# Patient Record
Sex: Male | Born: 1955
Health system: Southern US, Community
[De-identification: ages and names within clinical notes are randomized; demographics above are authoritative.]

## PROBLEM LIST (undated history)

## (undated) DIAGNOSIS — E782 Mixed hyperlipidemia: Secondary | ICD-10-CM

## (undated) DIAGNOSIS — R7302 Impaired glucose tolerance (oral): Secondary | ICD-10-CM

## (undated) DIAGNOSIS — R05 Cough: Secondary | ICD-10-CM

## (undated) DIAGNOSIS — E8881 Metabolic syndrome: Secondary | ICD-10-CM

## (undated) DIAGNOSIS — D689 Coagulation defect, unspecified: Secondary | ICD-10-CM

## (undated) DIAGNOSIS — Z872 Personal history of diseases of the skin and subcutaneous tissue: Secondary | ICD-10-CM

## (undated) DIAGNOSIS — F411 Generalized anxiety disorder: Secondary | ICD-10-CM

## (undated) DIAGNOSIS — E119 Type 2 diabetes mellitus without complications: Secondary | ICD-10-CM

## (undated) DIAGNOSIS — K219 Gastro-esophageal reflux disease without esophagitis: Secondary | ICD-10-CM

## (undated) DIAGNOSIS — R011 Cardiac murmur, unspecified: Secondary | ICD-10-CM

## (undated) DIAGNOSIS — M5412 Radiculopathy, cervical region: Secondary | ICD-10-CM

## (undated) DIAGNOSIS — T7840XA Allergy, unspecified, initial encounter: Secondary | ICD-10-CM

## (undated) DIAGNOSIS — N400 Enlarged prostate without lower urinary tract symptoms: Secondary | ICD-10-CM

## (undated) DIAGNOSIS — Z87891 Personal history of nicotine dependence: Secondary | ICD-10-CM

## (undated) DIAGNOSIS — E739 Lactose intolerance, unspecified: Secondary | ICD-10-CM

## (undated) DIAGNOSIS — I2510 Atherosclerotic heart disease of native coronary artery without angina pectoris: Secondary | ICD-10-CM

## (undated) DIAGNOSIS — J069 Acute upper respiratory infection, unspecified: Secondary | ICD-10-CM

## (undated) DIAGNOSIS — G5600 Carpal tunnel syndrome, unspecified upper limb: Secondary | ICD-10-CM

## (undated) DIAGNOSIS — E785 Hyperlipidemia, unspecified: Secondary | ICD-10-CM

## (undated) DIAGNOSIS — K625 Hemorrhage of anus and rectum: Secondary | ICD-10-CM

## (undated) DIAGNOSIS — T887XXA Unspecified adverse effect of drug or medicament, initial encounter: Secondary | ICD-10-CM

## (undated) DIAGNOSIS — I10 Essential (primary) hypertension: Secondary | ICD-10-CM

## (undated) HISTORY — DX: Impaired glucose tolerance (oral): R73.02

## (undated) HISTORY — DX: Acute upper respiratory infection, unspecified: J06.9

## (undated) HISTORY — DX: Lactose intolerance, unspecified: E73.9

## (undated) HISTORY — PX: SPINE SURGERY: SHX786

## (undated) HISTORY — DX: Benign prostatic hyperplasia without lower urinary tract symptoms: N40.0

## (undated) HISTORY — DX: Cough: R05

## (undated) HISTORY — DX: Mixed hyperlipidemia: E78.2

## (undated) HISTORY — DX: Carpal tunnel syndrome, unspecified upper limb: G56.00

## (undated) HISTORY — DX: Personal history of nicotine dependence: Z87.891

## (undated) HISTORY — DX: Hyperlipidemia, unspecified: E78.5

## (undated) HISTORY — DX: Metabolic syndrome: E88.81

## (undated) HISTORY — PX: POLYPECTOMY: SHX149

## (undated) HISTORY — DX: Personal history of diseases of the skin and subcutaneous tissue: Z87.2

## (undated) HISTORY — DX: Unspecified adverse effect of drug or medicament, initial encounter: T88.7XXA

## (undated) HISTORY — DX: Gastro-esophageal reflux disease without esophagitis: K21.9

## (undated) HISTORY — DX: Essential (primary) hypertension: I10

## (undated) HISTORY — DX: Generalized anxiety disorder: F41.1

## (undated) HISTORY — DX: Hemorrhage of anus and rectum: K62.5

## (undated) HISTORY — DX: Allergy, unspecified, initial encounter: T78.40XA

## (undated) HISTORY — DX: Coagulation defect, unspecified: D68.9

## (undated) HISTORY — PX: FINGER SURGERY: SHX640

---

## 2004-07-09 ENCOUNTER — Ambulatory Visit: Payer: Self-pay | Admitting: Cardiology

## 2004-08-03 ENCOUNTER — Ambulatory Visit: Payer: Self-pay | Admitting: Internal Medicine

## 2005-05-14 ENCOUNTER — Ambulatory Visit: Payer: Self-pay | Admitting: Internal Medicine

## 2006-05-15 ENCOUNTER — Ambulatory Visit: Payer: Self-pay | Admitting: Internal Medicine

## 2006-05-16 ENCOUNTER — Encounter (INDEPENDENT_AMBULATORY_CARE_PROVIDER_SITE_OTHER): Payer: Self-pay | Admitting: *Deleted

## 2006-06-30 ENCOUNTER — Emergency Department (HOSPITAL_COMMUNITY): Admission: EM | Admit: 2006-06-30 | Discharge: 2006-06-30 | Payer: Self-pay | Admitting: Emergency Medicine

## 2006-07-01 ENCOUNTER — Ambulatory Visit: Payer: Self-pay | Admitting: Internal Medicine

## 2006-07-10 ENCOUNTER — Encounter: Admission: RE | Admit: 2006-07-10 | Discharge: 2006-07-10 | Payer: Self-pay | Admitting: Internal Medicine

## 2006-11-24 ENCOUNTER — Ambulatory Visit: Payer: Self-pay | Admitting: Internal Medicine

## 2006-11-24 LAB — CONVERTED CEMR LAB
ALT: 15 units/L (ref 0–40)
AST: 21 units/L (ref 0–37)
Direct LDL: 187.8 mg/dL
Triglycerides: 162 mg/dL — ABNORMAL HIGH (ref 0–149)

## 2006-12-19 ENCOUNTER — Ambulatory Visit: Payer: Self-pay | Admitting: Internal Medicine

## 2007-04-13 ENCOUNTER — Telehealth (INDEPENDENT_AMBULATORY_CARE_PROVIDER_SITE_OTHER): Payer: Self-pay | Admitting: *Deleted

## 2007-04-23 ENCOUNTER — Ambulatory Visit: Payer: Self-pay | Admitting: Internal Medicine

## 2007-05-01 ENCOUNTER — Encounter (INDEPENDENT_AMBULATORY_CARE_PROVIDER_SITE_OTHER): Payer: Self-pay | Admitting: *Deleted

## 2007-05-01 ENCOUNTER — Ambulatory Visit: Payer: Self-pay | Admitting: Internal Medicine

## 2007-05-01 DIAGNOSIS — I10 Essential (primary) hypertension: Secondary | ICD-10-CM | POA: Insufficient documentation

## 2007-05-01 DIAGNOSIS — E785 Hyperlipidemia, unspecified: Secondary | ICD-10-CM | POA: Insufficient documentation

## 2007-05-01 HISTORY — DX: Hyperlipidemia, unspecified: E78.5

## 2007-05-01 HISTORY — DX: Essential (primary) hypertension: I10

## 2007-05-01 LAB — CONVERTED CEMR LAB
ALT: 24 units/L (ref 0–53)
AST: 21 units/L (ref 0–37)
Cholesterol, target level: 200 mg/dL
Cholesterol: 249 mg/dL (ref 0–200)
Direct LDL: 96.5 mg/dL
HDL: 41 mg/dL (ref >39.0)
Hgb A1c MFr Bld: 5.6 % (ref 4.6–6.0)

## 2007-05-26 ENCOUNTER — Ambulatory Visit: Payer: Self-pay | Admitting: Internal Medicine

## 2007-05-29 ENCOUNTER — Encounter (INDEPENDENT_AMBULATORY_CARE_PROVIDER_SITE_OTHER): Payer: Self-pay | Admitting: *Deleted

## 2007-06-08 ENCOUNTER — Encounter: Payer: Self-pay | Admitting: Internal Medicine

## 2007-07-24 ENCOUNTER — Telehealth (INDEPENDENT_AMBULATORY_CARE_PROVIDER_SITE_OTHER): Payer: Self-pay | Admitting: *Deleted

## 2007-10-02 ENCOUNTER — Ambulatory Visit: Payer: Self-pay | Admitting: Internal Medicine

## 2007-10-03 LAB — CONVERTED CEMR LAB
Basophils Relative: 1.2 % — ABNORMAL HIGH (ref 0.0–1.0)
CO2: 28 meq/L (ref 19–32)
Calcium: 9.2 mg/dL (ref 8.4–10.5)
Creatinine, Ser: 1 mg/dL (ref 0.4–1.5)
Direct LDL: 76.9 mg/dL
Eosinophils Relative: 2.7 % (ref 0.0–5.0)
GFR calc Af Amer: 102 mL/min
Glucose, Bld: 120 mg/dL — ABNORMAL HIGH (ref 70–99)
Hemoglobin: 14.5 g/dL (ref 13.0–17.0)
Lymphocytes Relative: 28.1 % (ref 12.0–46.0)
Microalb Creat Ratio: 5.3 mg/g (ref 0.0–30.0)
Microalb, Ur: 1.1 mg/dL (ref 0.0–1.9)
Monocytes Absolute: 0.4 10*3/uL (ref 0.2–0.7)
Neutro Abs: 3.2 10*3/uL (ref 1.4–7.7)
PSA: 0.89 ng/mL (ref 0.10–4.00)
Potassium: 4.1 meq/L (ref 3.5–5.1)
RDW: 11.9 % (ref 11.5–14.6)
TSH: 1.71 microintl units/mL (ref 0.35–5.50)
Total CHOL/HDL Ratio: 7.5
VLDL: 161 mg/dL — ABNORMAL HIGH (ref 0–40)
WBC: 5.3 10*3/uL (ref 4.5–10.5)

## 2007-10-06 ENCOUNTER — Encounter (INDEPENDENT_AMBULATORY_CARE_PROVIDER_SITE_OTHER): Payer: Self-pay | Admitting: *Deleted

## 2007-10-08 ENCOUNTER — Ambulatory Visit: Payer: Self-pay | Admitting: Internal Medicine

## 2007-10-08 ENCOUNTER — Encounter (INDEPENDENT_AMBULATORY_CARE_PROVIDER_SITE_OTHER): Payer: Self-pay | Admitting: *Deleted

## 2007-10-08 ENCOUNTER — Encounter: Payer: Self-pay | Admitting: Internal Medicine

## 2007-10-08 DIAGNOSIS — E8881 Metabolic syndrome: Secondary | ICD-10-CM

## 2007-10-08 DIAGNOSIS — N400 Enlarged prostate without lower urinary tract symptoms: Secondary | ICD-10-CM

## 2007-10-08 DIAGNOSIS — Z872 Personal history of diseases of the skin and subcutaneous tissue: Secondary | ICD-10-CM

## 2007-10-08 DIAGNOSIS — E782 Mixed hyperlipidemia: Secondary | ICD-10-CM | POA: Insufficient documentation

## 2007-10-08 DIAGNOSIS — F411 Generalized anxiety disorder: Secondary | ICD-10-CM | POA: Insufficient documentation

## 2007-10-08 DIAGNOSIS — Z87891 Personal history of nicotine dependence: Secondary | ICD-10-CM

## 2007-10-08 HISTORY — DX: Metabolic syndrome: E88.81

## 2007-10-08 HISTORY — DX: Personal history of nicotine dependence: Z87.891

## 2007-10-08 HISTORY — DX: Personal history of diseases of the skin and subcutaneous tissue: Z87.2

## 2007-10-08 HISTORY — DX: Benign prostatic hyperplasia without lower urinary tract symptoms: N40.0

## 2007-10-08 HISTORY — DX: Metabolic syndrome: E88.810

## 2007-10-08 HISTORY — DX: Generalized anxiety disorder: F41.1

## 2007-10-08 HISTORY — DX: Mixed hyperlipidemia: E78.2

## 2007-10-12 ENCOUNTER — Encounter (INDEPENDENT_AMBULATORY_CARE_PROVIDER_SITE_OTHER): Payer: Self-pay | Admitting: *Deleted

## 2007-10-16 ENCOUNTER — Telehealth: Payer: Self-pay | Admitting: Internal Medicine

## 2007-10-20 ENCOUNTER — Telehealth (INDEPENDENT_AMBULATORY_CARE_PROVIDER_SITE_OTHER): Payer: Self-pay | Admitting: *Deleted

## 2007-10-29 ENCOUNTER — Encounter (INDEPENDENT_AMBULATORY_CARE_PROVIDER_SITE_OTHER): Payer: Self-pay | Admitting: *Deleted

## 2007-10-29 ENCOUNTER — Ambulatory Visit: Payer: Self-pay | Admitting: Internal Medicine

## 2007-10-29 DIAGNOSIS — J069 Acute upper respiratory infection, unspecified: Secondary | ICD-10-CM | POA: Insufficient documentation

## 2007-10-29 HISTORY — DX: Acute upper respiratory infection, unspecified: J06.9

## 2007-12-11 ENCOUNTER — Ambulatory Visit: Payer: Self-pay | Admitting: Internal Medicine

## 2007-12-11 LAB — CONVERTED CEMR LAB
Cholesterol: 253 mg/dL (ref 0–200)
Direct LDL: 104.2 mg/dL
Total CHOL/HDL Ratio: 6.7
VLDL: 125 mg/dL — ABNORMAL HIGH (ref 0–40)

## 2007-12-17 ENCOUNTER — Ambulatory Visit: Payer: Self-pay | Admitting: Internal Medicine

## 2007-12-17 DIAGNOSIS — T887XXA Unspecified adverse effect of drug or medicament, initial encounter: Secondary | ICD-10-CM | POA: Insufficient documentation

## 2007-12-17 HISTORY — DX: Unspecified adverse effect of drug or medicament, initial encounter: T88.7XXA

## 2008-01-05 ENCOUNTER — Telehealth (INDEPENDENT_AMBULATORY_CARE_PROVIDER_SITE_OTHER): Payer: Self-pay | Admitting: *Deleted

## 2008-05-23 ENCOUNTER — Ambulatory Visit: Payer: Self-pay | Admitting: Internal Medicine

## 2008-05-23 DIAGNOSIS — K219 Gastro-esophageal reflux disease without esophagitis: Secondary | ICD-10-CM

## 2008-05-23 HISTORY — DX: Gastro-esophageal reflux disease without esophagitis: K21.9

## 2008-05-30 HISTORY — PX: OTHER SURGICAL HISTORY: SHX169

## 2008-10-12 ENCOUNTER — Ambulatory Visit: Payer: Self-pay | Admitting: Internal Medicine

## 2008-10-13 LAB — CONVERTED CEMR LAB
Albumin: 4 g/dL (ref 3.5–5.2)
Alkaline Phosphatase: 40 units/L (ref 39–117)
Bilirubin Urine: NEGATIVE
Bilirubin, Direct: 0.1 mg/dL (ref 0.0–0.3)
Calcium: 9.3 mg/dL (ref 8.4–10.5)
Cholesterol: 237 mg/dL (ref 0–200)
Direct LDL: 145.6 mg/dL
Eosinophils Absolute: 0.3 10*3/uL (ref 0.0–0.7)
GFR calc Af Amer: 102 mL/min
GFR calc non Af Amer: 84 mL/min
Glucose, Bld: 112 mg/dL — ABNORMAL HIGH (ref 70–99)
HCT: 42.8 % (ref 39.0–52.0)
HDL: 32 mg/dL — ABNORMAL LOW (ref >39.0)
Hemoglobin, Urine: NEGATIVE
Hemoglobin: 15.1 g/dL (ref 13.0–17.0)
Ketones, ur: NEGATIVE mg/dL
MCHC: 35.3 g/dL (ref 30.0–36.0)
MCV: 91.9 fL (ref 78.0–100.0)
Monocytes Absolute: 0.5 10*3/uL (ref 0.1–1.0)
Monocytes Relative: 7.4 % (ref 3.0–12.0)
Neutro Abs: 4.2 10*3/uL (ref 1.4–7.7)
PSA: 1.07 ng/mL (ref 0.10–4.00)
Platelets: 209 10*3/uL (ref 150–400)
Potassium: 4.5 meq/L (ref 3.5–5.1)
RDW: 12.1 % (ref 11.5–14.6)
Sodium: 141 meq/L (ref 135–145)
TSH: 2.48 microintl units/mL (ref 0.35–5.50)
Total Protein, Urine: NEGATIVE mg/dL
Total Protein: 6.8 g/dL (ref 6.0–8.3)
Triglycerides: 329 mg/dL (ref 0–149)
Urine Glucose: NEGATIVE mg/dL
pH: 7 (ref 5.0–8.0)

## 2008-10-19 ENCOUNTER — Ambulatory Visit: Payer: Self-pay | Admitting: Internal Medicine

## 2008-10-19 DIAGNOSIS — E739 Lactose intolerance, unspecified: Secondary | ICD-10-CM | POA: Insufficient documentation

## 2008-10-19 HISTORY — DX: Lactose intolerance, unspecified: E73.9

## 2008-11-16 ENCOUNTER — Ambulatory Visit: Payer: Self-pay | Admitting: Internal Medicine

## 2008-11-16 LAB — CONVERTED CEMR LAB
ALT: 22 units/L (ref 0–53)
AST: 23 units/L (ref 0–37)
Alkaline Phosphatase: 39 units/L (ref 39–117)
Total Bilirubin: 0.7 mg/dL (ref 0.3–1.2)
Total CHOL/HDL Ratio: 5.1
Triglycerides: 193 mg/dL — ABNORMAL HIGH (ref 0–149)

## 2009-01-03 ENCOUNTER — Encounter (INDEPENDENT_AMBULATORY_CARE_PROVIDER_SITE_OTHER): Payer: Self-pay | Admitting: *Deleted

## 2009-11-01 ENCOUNTER — Telehealth: Payer: Self-pay | Admitting: Internal Medicine

## 2009-11-03 ENCOUNTER — Ambulatory Visit: Payer: Self-pay | Admitting: Internal Medicine

## 2009-11-03 ENCOUNTER — Encounter (INDEPENDENT_AMBULATORY_CARE_PROVIDER_SITE_OTHER): Payer: Self-pay | Admitting: *Deleted

## 2009-11-03 LAB — CONVERTED CEMR LAB
ALT: 26 units/L (ref 0–53)
Albumin: 4.3 g/dL (ref 3.5–5.2)
Basophils Relative: 1.1 % (ref 0.0–3.0)
Bilirubin Urine: NEGATIVE
Bilirubin, Direct: 0.1 mg/dL (ref 0.0–0.3)
CO2: 28 meq/L (ref 19–32)
Chloride: 106 meq/L (ref 96–112)
Eosinophils Relative: 4.1 % (ref 0.0–5.0)
HCT: 43.3 % (ref 39.0–52.0)
Hemoglobin, Urine: NEGATIVE
Hemoglobin: 14.7 g/dL (ref 13.0–17.0)
Lymphs Abs: 1.5 10*3/uL (ref 0.7–4.0)
MCV: 92.2 fL (ref 78.0–100.0)
Monocytes Absolute: 0.4 10*3/uL (ref 0.1–1.0)
Neutro Abs: 4.3 10*3/uL (ref 1.4–7.7)
Neutrophils Relative %: 65.9 % (ref 43.0–77.0)
Nitrite: NEGATIVE
Potassium: 4.1 meq/L (ref 3.5–5.1)
RBC: 4.69 M/uL (ref 4.22–5.81)
Sodium: 140 meq/L (ref 135–145)
Total CHOL/HDL Ratio: 4
Total Protein, Urine: NEGATIVE mg/dL
Total Protein: 7.7 g/dL (ref 6.0–8.3)
VLDL: 60.8 mg/dL — ABNORMAL HIGH (ref 0.0–40.0)
WBC: 6.6 10*3/uL (ref 4.5–10.5)

## 2009-11-08 ENCOUNTER — Ambulatory Visit: Payer: Self-pay | Admitting: Internal Medicine

## 2009-11-08 DIAGNOSIS — R05 Cough: Secondary | ICD-10-CM

## 2009-11-08 DIAGNOSIS — R059 Cough, unspecified: Secondary | ICD-10-CM | POA: Insufficient documentation

## 2009-11-08 HISTORY — DX: Cough, unspecified: R05.9

## 2009-11-12 ENCOUNTER — Encounter: Payer: Self-pay | Admitting: Internal Medicine

## 2009-12-15 ENCOUNTER — Encounter (INDEPENDENT_AMBULATORY_CARE_PROVIDER_SITE_OTHER): Payer: Self-pay | Admitting: *Deleted

## 2009-12-15 ENCOUNTER — Ambulatory Visit: Payer: Self-pay | Admitting: Internal Medicine

## 2009-12-18 ENCOUNTER — Telehealth: Payer: Self-pay | Admitting: Internal Medicine

## 2009-12-25 ENCOUNTER — Ambulatory Visit: Payer: Self-pay | Admitting: Internal Medicine

## 2009-12-27 ENCOUNTER — Encounter: Payer: Self-pay | Admitting: Internal Medicine

## 2010-07-02 ENCOUNTER — Telehealth: Payer: Self-pay | Admitting: Internal Medicine

## 2010-10-09 NOTE — Letter (Signed)
Summary: [facility] Instructions  [facility]  [address] [place], [place] [address]   Phone: [phone]  Fax: [phone]       Scott Valentine    1956-08-26    MRN: [id number]       Procedure Day /Date: Monday 12/25/09     Arrival Time:  10:00 a.m.     Procedure Time: 11:00 s.m.     Location of Procedure:                     x  [facility] (4th Floor)   PREPARATION FOR COLONOSCOPY WITH MIRALAX  Starting 5 days prior to your procedure 12/20/09 do not eat nuts, seeds, popcorn, corn, beans, peas,  salads, or any raw vegetables.  Do not take any fiber supplements (e.g. Metamucil, Citrucel, and Benefiber). ____________________________________________________________________________________________________   THE DAY BEFORE YOUR PROCEDURE         DATE:  12/24/09  DAY: Sunday  1   Drink clear liquids the entire day-NO SOLID FOOD  2   Do not drink anything colored red or purple.  Avoid juices with pulp.  No orange juice.  3   Drink at least 64 oz. (8 glasses) of fluid/clear liquids during the day to prevent dehydration and help the prep work efficiently.  CLEAR LIQUIDS INCLUDE: Water Jello Ice Popsicles Tea (sugar ok, no milk/cream) Powdered fruit flavored drinks Coffee (sugar ok, no milk/cream) Gatorade Juice: apple, white grape, white cranberry  Lemonade Clear bullion, consomm, broth Carbonated beverages (any kind) Strained chicken noodle soup Hard Candy  4   Mix the entire bottle of Miralax with 64 oz. of Gatorade/Powerade in the morning and put in the refrigerator to chill.  5   At 3:00 pm take 2 Dulcolax/Bisacodyl tablets.  6   At 4:30 pm take one Reglan/Metoclopramide tablet.  7  Starting at 5:00 pm drink one 8 oz glass of the Miralax mixture every 15-20 minutes until you have finished drinking the entire 64 oz.  You should finish drinking prep around 7:30 or 8:00 pm.  8   If you are nauseated, you may take the 2nd Reglan/Metoclopramide  tablet at 6:30 pm.        9    At 8:00 pm take 2 more DULCOLAX/Bisacodyl tablets.     THE DAY OF YOUR PROCEDURE      DATE:  12/25/09  DAY:  Monday  You may drink clear liquids until  9:00 a.m.  (2 HOURS BEFORE PROCEDURE).   MEDICATION INSTRUCTIONS  Unless otherwise instructed, you should take regular prescription medications with a small sip of water as early as possible the morning of your procedure.   Additional medication instructions:  n/a         OTHER INSTRUCTIONS  You will need a responsible adult at least 53 years of age to accompany you and drive you home.   This person must remain in the waiting room during your procedure.  Wear loose fitting clothing that is easily removed.  Leave jewelry and other valuables at home.  However, you may wish to bring a book to read or an iPod/MP3 player to listen to music as you wait for your procedure to start.  Remove all body piercing jewelry and leave at home.  Total time from sign-in until discharge is approximately 2-3 hours.  You should go home directly after your procedure and rest.  You can resume normal activities the day after your procedure.  The day  of your procedure you should not:   Drive   Make legal decisions   Operate machinery   Drink alcohol   Return to work  You will receive specific instructions about eating, activities and medications before you leave.   The above instructions have been reviewed and explained to me by   Sherren Kerns RN  December 15, 2009 10:00 AM     I fully understand and can verbalize these instructions _____________________________ Date _______

## 2010-10-09 NOTE — Miscellaneous (Signed)
Summary: Orders Update   Clinical Lists Changes  Orders: Added new Service order of Est. Patient 40-64 years (99396) - Signed 

## 2010-10-09 NOTE — Progress Notes (Signed)
Summary: Rx refill req  Phone Note Refill Request Message from:  Patient on July 02, 2010 9:34 AM  Refills Requested: Medication #1:  METOPROLOL TARTRATE 25 MG  TABS 1 by mouth once daily   Dosage confirmed as above?Dosage Confirmed   Supply Requested: 9 months Pt is req Rx to [facility]   Method Requested: Electronic Initial call taken by: Margaret Pyle, CMA,  July 02, 2010 9:34 AM    Prescriptions: METOPROLOL TARTRATE 25 MG  TABS (METOPROLOL TARTRATE) 1 by mouth once daily  #90 x 2   Entered by:   Margaret Pyle, CMA   Authorized by:   Corwin Levins MD   Signed by:   Margaret Pyle, CMA on 07/02/2010   Method used:   Electronically to        [facility]  [address] (retail)       [address]       [place], [place]  [address]       Ph: [phone]       Fax: [phone]   RxID:   [id number]

## 2010-10-09 NOTE — Miscellaneous (Signed)
Summary: previsit/rm  Clinical Lists Changes  Medications: Added new medication of MIRALAX   POWD (POLYETHYLENE GLYCOL 3350) As per prep  instructions. - Signed Added new medication of DULCOLAX 5 MG  TBEC (BISACODYL) Day before procedure take 2 at 3pm and 2 at 8pm. - Signed Added new medication of REGLAN 10 MG  TABS (METOCLOPRAMIDE HCL) As per prep instructions. - Signed Rx of MIRALAX   POWD (POLYETHYLENE GLYCOL 3350) As per prep  instructions.;  #255gm x 0;  Signed;  Entered by: Sherren Kerns RN;  Authorized by: Hilarie Fredrickson MD;  Method used: Electronically to [facility] [phone]*, [address], [place], [place]  [address], Ph: [phone] or [phone], Fax: [phone] Rx of DULCOLAX 5 MG  TBEC (BISACODYL) Day before procedure take 2 at 3pm and 2 at 8pm.;  #4 x 0;  Signed;  Entered by: Sherren Kerns RN;  Authorized by: Hilarie Fredrickson MD;  Method used: Electronically to [facility] [phone]*, [address], [place], [place], [place]  [address], Ph: [phone] or [phone], Fax: [phone] Rx of REGLAN 10 MG  TABS (METOCLOPRAMIDE HCL) As per prep instructions.;  #2 x 0;  Signed;  Entered by: Sherren Kerns RN;  Authorized by: Hilarie Fredrickson MD;  Method used: Electronically to [facility] [phone]*, [address] [place], [place], [place]  [address], Ph: [phone] or [phone], Fax: [phone] Observations: Added new observation of ALLERGY REV: Done (12/15/2009 8:54)    Prescriptions: REGLAN 10 MG  TABS (METOCLOPRAMIDE HCL) As per prep instructions.  #2 x 0   Entered by:   Sherren Kerns RN   Authorized by:   Hilarie Fredrickson MD   Signed by:   Sherren Kerns RN on 12/15/2009   Method used:   Electronically to        [facility] [phone]* (retail)       [address] [place], [place]  [address]       Ph: [phone] or [phone]       Fax: [phone]   RxID:   [id number] DULCOLAX 5 MG  TBEC (BISACODYL) Day  before procedure take 2 at 3pm and 2 at 8pm.  #4 x 0   Entered by:   Sherren Kerns RN   Authorized by:   Hilarie Fredrickson MD   Signed by:   Sherren Kerns RN on 12/15/2009   Method used:   Electronically to        [facility] [phone]* (retail)       [address] [place], [place]  [address]       Ph: [phone] or [phone]       Fax: [phone]   RxID:   [id number] MIRALAX   POWD (POLYETHYLENE GLYCOL 3350) As per prep  instructions.  #255gm x 0   Entered by:   Sherren Kerns RN   Authorized by:   Hilarie Fredrickson MD   Signed by:   Sherren Kerns RN on 12/15/2009   Method used:   Electronically to        [facility] [phone]* (retail)       [address] [place], [place]  [address]  Ph: [phone] or [phone]       Fax: [phone]   RxID:   [id number]

## 2010-10-09 NOTE — Assessment & Plan Note (Signed)
Summary: CPX/#/CD   Vital Signs:  Patient profile:   55 year old male Height:      69 inches Weight:      238.50 pounds BMI:     35.35 O2 Sat:      97 % on Room air Temp:     97.2 degrees F oral Pulse rate:   62 / minute BP sitting:   112 / 70  (left arm) Cuff size:   large  Vitals Entered ByZella Ball Ewing (November 08, 2009 10:38 AM)  O2 Flow:  Room air  Preventive Care Screening     had the flu shot in oct 2010  CC: Adult Physical/RE   CC:  Adult Physical/RE.  History of Present Illness: overall doing well, no complaints; Pt denies CP, sob, doe, wheezing, orthopnea, pnd, worsening LE edema, palps, dizziness or syncope   Pt denies new neuro symptoms such as headache, facial or extremity weakness   Problems Prior to Update: 1)  Cough  (ICD-786.2) 2)  Glucose Intolerance  (ICD-271.3) 3)  Preventive Health Care  (ICD-V70.0) 4)  Gerd  (ICD-530.81) 5)  Uns Advrs Eff Uns Rx Medicinal&biological Sbstnc  (ICD-995.20) 6)  Hyperlipidemia  (ICD-272.4) 7)  Uri  (ICD-465.9) 8)  Tobacco Use, Quit  (ICD-V15.82) 9)  Hypertension, Essential Nos  (ICD-401.9) 10)  Hyperplasia Prostate Uns w/o Ur Obst & Oth Luts  (ICD-600.90) 11)  Dysmetabolic Syndrome X  (ICD-277.7) 12)  Anxiety State, Unspecified  (ICD-300.00) 13)  Hyperlipidemia  (ICD-272.2) 14)  Routine General Medical Exam@health  Care Facl  (ICD-V70.0) 15)  Hypertension  (ICD-401.9) 16)  Hyperlipidemia  (ICD-272.4) 17)  Stevens-johnson Syndrome, Hx of  (ICD-V13.3) 18)  Metabolic Syndrome X  (ICD-277.7) 19)  Hypertension, Essential Nos  (ICD-401.9) 20)  Hyperlipidemia Nec/nos  (ICD-272.4)  Medications Prior to Update: 1)  Crestor 20 Mg Tabs (Rosuvastatin Calcium) .Marland Kitchen.. 1 By Mouth Once Daily 2)  Metoprolol Tartrate 25 Mg  Tabs (Metoprolol Tartrate) .Marland Kitchen.. 1 By Mouth Once Daily 3)  Asa 325mg  4)  Multivitamin 5)  Folic Acid 6)  Fish Oil 7)  Allegra 180 Mg  Tabs (Fexofenadine Hcl) .Marland Kitchen.. 1 By Mouth Once Daily Prn 8)  Tricor 145 Mg  Tabs (Fenofibrate) .Marland Kitchen.. 1 By Mouth Once Daily 9)  Niaspan 500 Mg Cr-Tabs (Niacin (Antihyperlipidemic)) .... Take 3 By Mouth Qd  Current Medications (verified): 1)  Crestor 20 Mg Tabs (Rosuvastatin Calcium) .Marland Kitchen.. 1 By Mouth Once Daily 2)  Metoprolol Tartrate 25 Mg  Tabs (Metoprolol Tartrate) .Marland Kitchen.. 1 By Mouth Once Daily 3)  Asa 325mg  4)  Multivitamin 5)  Folic Acid 6)  Fish Oil 7)  Allegra 180 Mg  Tabs (Fexofenadine Hcl) .Marland Kitchen.. 1 By Mouth Once Daily Prn 8)  Fenofibrate 160 Mg Tabs (Fenofibrate) .Marland Kitchen.. 1po Once Daily 9)  Lorazepam 0.5 Mg Tabs (Lorazepam) .Marland Kitchen.. 1 By Mouth As Needed  Allergies (verified): 1)  ! Sulfa 2)  ! Lipitor 3)  ! * Zetia  Past History:  Past Medical History: Last updated: 10/19/2008 Stevens-Johnson Syndrome with sulfa - 1998 metabolic syndrome Hyperlipidemia/hypertriglyceridemia Hypertension elevated homocysteine GERD glucose intolerance  Past Surgical History: Last updated: 05/23/2008 Finger surgery 05/30/08  Family History: Last updated: 10/08/2007 Father: MI x 2 (CAD onset 50+), pneumonectomy (Lung CA), prostate sx. no CA Mother: DM,mental health Siblings: bro murmur PGF:  MI, prostate CA  Social History: Last updated: 05/23/2008 Former Smoker, on chantrix Alcohol use-no No specific diet Married 3 children work - [employer] works  Risk Factors: Exercise: yes (  10/08/2007)  Risk Factors: Smoking Status: quit (10/08/2007) Packs/Day: 1 (10/08/2007)  Review of Systems  The patient denies anorexia, fever, vision loss, decreased hearing, hoarseness, chest pain, syncope, dyspnea on exertion, peripheral edema, prolonged cough, headaches, hemoptysis, abdominal pain, melena, hematochezia, severe indigestion/heartburn, hematuria, incontinence, muscle weakness, suspicious skin lesions, transient blindness, difficulty walking, depression, unusual weight change, abnormal bleeding, enlarged lymph nodes, and angioedema.         all otherwise negative  per pt - - except for occasional cough nonprod , without CP , sob or wheezing  Physical Exam  General:  alert and overweight-appearing.   Head:  normocephalic and atraumatic.   Eyes:  vision grossly intact, pupils equal, and pupils round.   Ears:  R ear normal and L ear normal.   Nose:  no external deformity and no nasal discharge.   Mouth:  no gingival abnormalities and pharynx pink and moist.   Neck:  supple and no masses.   Lungs:  normal respiratory effort and normal breath sounds.   Heart:  normal rate and regular rhythm.   Abdomen:  soft, non-tender, and normal bowel sounds.   Msk:  no joint tenderness and no joint swelling.   Extremities:  no edema, no erythema  Neurologic:  cranial nerves II-XII intact and strength normal in all extremities.   Skin:  color normal, no rashes, and no suspicious lesions.   Psych:  moderately anxious.     Impression & Recommendations:  Problem # 1:  Preventive Health Care (ICD-V70.0) Overall doing well, age appropriate education and counseling updated and referral for appropriate preventive services done unless declined, immunizations up to date or declined, diet counseling done if overweight, urged to quit smoking if smokes , most recent labs reviewed and current ordered if appropriate, ecg reviewed or declined (interpretation per ECG scanned in the EMR if done); information regarding Medicare Prevention requirements given if appropriate  Orders: EKG w/ Interpretation (93[address])  Problem # 2:  COUGH (ICD-786.2)  unclear etiology - for cxr with pt hx of smoking; urged to quit smoking   Orders: T-2 View CXR, Same Day (71020.5TC)  Complete Medication List: 1)  Crestor 20 Mg Tabs (Rosuvastatin calcium) .Marland Kitchen.. 1 by mouth once daily 2)  Metoprolol Tartrate 25 Mg Tabs (Metoprolol tartrate) .Marland Kitchen.. 1 by mouth once daily 3)  Asa 325mg   4)  Multivitamin  5)  Folic Acid  6)  Fish Oil  7)  Allegra 180 Mg Tabs (Fexofenadine hcl) .Marland Kitchen.. 1 by mouth once daily  prn 8)  Fenofibrate 160 Mg Tabs (Fenofibrate) .Marland Kitchen.. 1po once daily 9)  Lorazepam 0.5 Mg Tabs (Lorazepam) .Marland Kitchen.. 1 by mouth as needed  Patient Instructions: 1)  Please go to Radiology in the basement level for your X-Ray today  2)  your EKG was normal 3)  Continue all previous medications as before this visit - your medications were sent to the pharmacy except for the lorazepam 4)  Please continue to focus on the lower cholesterol diet, excercise and wt loss 5)  Please keep your f/u appt with Dr Marina Goodell April 18 as planned 6)  Please schedule a follow-up appointment in 1 year or sooner if needed Prescriptions: LORAZEPAM 0.5 MG TABS (LORAZEPAM) 1 by mouth as needed  #90 x 1   Entered and Authorized by:   Corwin Levins MD   Signed by:   Corwin Levins MD on 11/08/2009   Method used:   Print then Give to Patient   RxID:   [id number]  FENOFIBRATE 160 MG TABS (FENOFIBRATE) 1po once daily  #90 x 3   Entered and Authorized by:   Corwin Levins MD   Signed by:   Corwin Levins MD on 11/08/2009   Method used:   Electronically to        [facility] [phone]* (retail)       [address] [place], [place]  [address]       Ph: [phone] or [phone]       Fax: [phone]   RxID:   [id number] ALLEGRA 180 MG  TABS (FEXOFENADINE HCL) 1 by mouth once daily prn  #90 x 3   Entered and Authorized by:   Corwin Levins MD   Signed by:   Corwin Levins MD on 11/08/2009   Method used:   Electronically to        [facility] [phone]* (retail)       [address]       [place], [place]  [address]       Ph: [phone] or [phone]       Fax: [phone]   RxID:   [id number] METOPROLOL TARTRATE 25 MG  TABS (METOPROLOL TARTRATE) 1 by mouth once daily  #90 x 3   Entered and Authorized by:   Corwin Levins MD   Signed by:   Corwin Levins MD on 11/08/2009   Method used:   Electronically to        [facility] [phone]* (retail)        [address] [place], [place]  [address]       Ph: [phone] or [phone]       Fax: [phone]   RxID:   [id number] CRESTOR 20 MG TABS (ROSUVASTATIN CALCIUM) 1 by mouth once daily  #90 x 3   Entered and Authorized by:   Corwin Levins MD   Signed by:   Corwin Levins MD on 11/08/2009   Method used:   Electronically to        [facility] [phone]* (retail)       [address] [place], [place]  [address]       Ph: [phone] or [phone]       Fax: [phone]   RxID:   [id number]

## 2010-10-09 NOTE — Procedures (Signed)
Summary: Colonoscopy  Patient: [name] Note: All result statuses are Final unless otherwise noted.  Tests: (1) Colonoscopy (COL)   COL Colonoscopy           DONE     [facility]     [address]     [place], [place]  [address]           COLONOSCOPY PROCEDURE REPORT           PATIENT:  Scott Valentine, Scott Valentine  MR#:  [id number]     BIRTHDATE:  31-Aug-1956, 54 yrs. old  GENDER:  male     ENDOSCOPIST:  Wilhemina Bonito. Eda Keys, MD     REF. BY:  Surveillance Program Recall,     PROCEDURE DATE:  12/25/2009     PROCEDURE:  Colonoscopy with snare polypectomy x 4     ASA CLASS:  Class II     INDICATIONS:  history of hyperplastic polyps ; 01-2004 w/ multiple     HP including 7mm ascending     MEDICATIONS:   Fentanyl 100 mcg IV, Versed 10 mg IV, Benadryl 25     mg IV           DESCRIPTION OF PROCEDURE:   After the risks benefits and     alternatives of the procedure were thoroughly explained, informed     consent was obtained.  Digital rectal exam was performed and     revealed no abnormalities.   The LB [id number] endoscope     was introduced through the anus and advanced to the cecum, which     was identified by both the appendix and ileocecal valve, without     limitations.Time to cecum = 3:45 min. The quality of the prep was     good, using MoviPrep.  The instrument was then slowly withdrawn     (time = 17:31 min) as the colon was fully examined.     <<PROCEDUREIMAGES>>           FINDINGS:  Four polyps were found - 2mm ascending, 8mm sessile     transverse, 2mm descending, and 4mm sigmoid. Polyps were snared     without cautery. Retrieval was successful.  Moderate     diverticulosis was found in the sigmoid colon.   Retroflexed views     in the rectum revealed internal hemorrhoids.    The scope was then     withdrawn from the patient and the procedure completed.           COMPLICATIONS:  None     ENDOSCOPIC IMPRESSION:     1) Four polyps - removed     2) Moderate  diverticulosis in the sigmoid colon     3) Internal hemorrhoids           RECOMMENDATIONS:     1) Follow up colonoscopy in 5 years     ______________________________     Wilhemina Bonito. Eda Keys, MD           CC:  The Patient; Corwin Levins, MD           n.     Rosalie DoctorWilhemina Bonito. Eda Keys at 12/25/2009 12:23 PM           Leonie Man, [id number]  Note: An exclamation mark (!) indicates a result that was not dispersed into the flowsheet. Document Creation Date: 12/25/2009 12:24 PM _______________________________________________________________________  (1) Order result status: Final Collection or observation date-time: 12/25/2009 12:08  Requested date-time:  Receipt date-time:  Reported date-time:  Referring Physician:   Ordering Physician: Fransico Setters [phone]) Specimen Source:  Source: Launa Grill Order Number: [id number] Lab site:   Appended Document: Colonoscopy recall     Procedures Next Due Date:    Colonoscopy: 12/2014

## 2010-10-09 NOTE — Progress Notes (Signed)
Summary: Schedule Colonoscopy  Phone Note Outgoing Call Call back at Home Phone [phone]   Call placed by: Harlow Mares CMA Duncan Dull),  November 01, 2009 4:40 PM Call placed to: Patient Summary of Call: spoke to patients wife she states she will have patient call back and schedule his colonoscopy Initial call taken by: Harlow Mares CMA Duncan Dull),  November 01, 2009 4:42 PM     Appended Document: Schedule Colonoscopy Pt came into the office and scheduled his colonoscopy for 12-25-09

## 2010-10-09 NOTE — Progress Notes (Signed)
Summary: Directions?  Phone Note From Pharmacy   Caller: [facility] [phone]* Summary of Call: pharmacy called requesting more specific directions on pt's Lorazepam 0.5mg  for Insurance billing purposes. Initial call taken by: Margaret Pyle, CMA,  December 18, 2009 1:44 PM  Follow-up for Phone Call        1 by mouth once daily as needed  Follow-up by: Corwin Levins MD,  December 18, 2009 2:06 PM  Additional Follow-up for Phone Call Additional follow up Details #1::        Ronaldo Miyamoto at [facility] pharmacy informed Additional Follow-up by: Margaret Pyle, CMA,  December 18, 2009 2:26 PM

## 2010-10-09 NOTE — Letter (Signed)
Summary: Previsit letter  [facility]  [address] [place], [place] [address]   Phone: [phone]  Fax: [phone]       11/03/2009 MRN: [id number]  Scott Valentine [address] [place], [place]  [address]  Dear [name],  Welcome to the Gastroenterology Division at [facility].    You are scheduled to see a nurse for your pre-procedure visit on 12-15-09 at 9:30a.m. on the 3rd floor at [facility], [address]  We ask that you try to arrive at our office 15 minutes prior to your appointment time to allow for check-in.  Your nurse visit will consist of discussing your medical and surgical history, your immediate family medical history, and your medications.    Please bring a complete list of all your medications or, if you prefer, bring the medication bottles and we will list them.  We will need to be aware of both prescribed and over the counter drugs.  We will need to know exact dosage information as well.  If you are on blood thinners (Coumadin, Plavix, Aggrenox, Ticlid, etc.) please call our office today/prior to your appointment, as we need to consult with your physician about holding your medication.   Please be prepared to read and sign documents such as consent forms, a financial agreement, and acknowledgement forms.  If necessary, and with your consent, a friend or relative is welcome to sit-in on the nurse visit with you.  Please bring your insurance card so that we may make a copy of it.  If your insurance requires a referral to see a specialist, please bring your referral form from your primary care physician.  No co-pay is required for this nurse visit.     If you cannot keep your appointment, please call [phone] to cancel or reschedule prior to your appointment date.  This allows [place] the opportunity to schedule an appointment for another patient in need of care.    Thank you for choosing [facility] for your medical needs.   We appreciate the opportunity to care for you.  Please visit [place] at our website  to learn more about our practice.                     Sincerely.                                                                                                                   The Gastroenterology Division

## 2010-10-09 NOTE — Letter (Signed)
Summary: Patient Notice- Polyp Results  [facility]  [address] [place], [place] [address]   Phone: [phone]  Fax: [phone]        December 27, 2009 MRN: [id number]    Scott Valentine [address] [place], [place]  [address]    Dear [name],  I am pleased to inform you that the colon polyp(s) removed during your recent colonoscopy was (were) found to be benign (no cancer detected) upon pathologic examination.  I recommend you have a repeat colonoscopy examination in 5 years to look for recurrent polyps, as having colon polyps increases your risk for having recurrent polyps or even colon cancer in the future.  Should you develop new or worsening symptoms of abdominal pain, bowel habit changes or bleeding from the rectum or bowels, please schedule an evaluation with either your primary care physician or with me.  Additional information/recommendations:  __ No further action with gastroenterology is needed at this time. Please      follow-up with your primary care physician for your other healthcare      needs.   Please call us if you are having persistent problems or have questions about your condition that have not been fully answered at this time.  Sincerely,  Hilarie Fredrickson MD  This letter has been electronically signed by your physician.  Appended Document: Patient Notice- Polyp Results letter mailed 4.25.11

## 2010-11-28 ENCOUNTER — Other Ambulatory Visit: Payer: Self-pay

## 2010-11-28 MED ORDER — ROSUVASTATIN CALCIUM 20 MG PO TABS: 20.0000 mg | ORAL_TABLET | Freq: Every day | ORAL | Status: DC

## 2010-12-22 ENCOUNTER — Other Ambulatory Visit: Payer: Self-pay | Admitting: Internal Medicine

## 2010-12-28 ENCOUNTER — Other Ambulatory Visit (INDEPENDENT_AMBULATORY_CARE_PROVIDER_SITE_OTHER): Payer: 59

## 2010-12-28 ENCOUNTER — Other Ambulatory Visit: Payer: Self-pay | Admitting: Internal Medicine

## 2010-12-28 DIAGNOSIS — Z0000 Encounter for general adult medical examination without abnormal findings: Secondary | ICD-10-CM

## 2010-12-28 LAB — HEPATIC FUNCTION PANEL
ALT: 21 U/L (ref 0–53)
Albumin: 3.8 g/dL (ref 3.5–5.2)
Alkaline Phosphatase: 33 U/L — ABNORMAL LOW (ref 39–117)
Bilirubin, Direct: 0 mg/dL (ref 0.0–0.3)
Total Protein: 6.6 g/dL (ref 6.0–8.3)

## 2010-12-28 LAB — LIPID PANEL
HDL: 33.4 mg/dL — ABNORMAL LOW (ref >39.00)
Total CHOL/HDL Ratio: 5

## 2010-12-28 LAB — CBC WITH DIFFERENTIAL/PLATELET
Basophils Absolute: 0 10*3/uL (ref 0.0–0.1)
Eosinophils Absolute: 0.3 10*3/uL (ref 0.0–0.7)
HCT: 39.2 % (ref 39.0–52.0)
Hemoglobin: 13.8 g/dL (ref 13.0–17.0)
Lymphs Abs: 1.5 10*3/uL (ref 0.7–4.0)
MCHC: 35.3 g/dL (ref 30.0–36.0)
Monocytes Absolute: 0.5 10*3/uL (ref 0.1–1.0)
Neutro Abs: 4.8 10*3/uL (ref 1.4–7.7)
RDW: 12.5 % (ref 11.5–14.6)

## 2010-12-28 LAB — URINALYSIS
Ketones, ur: NEGATIVE
Leukocytes, UA: NEGATIVE
Nitrite: NEGATIVE
Specific Gravity, Urine: 1.02 (ref 1.000–1.030)
Urobilinogen, UA: 0.2 (ref 0.0–1.0)
pH: 5.5 (ref 5.0–8.0)

## 2010-12-28 LAB — TSH: TSH: 1.78 u[IU]/mL (ref 0.35–5.50)

## 2010-12-28 LAB — BASIC METABOLIC PANEL
CO2: 27 mEq/L (ref 19–32)
Calcium: 9.5 mg/dL (ref 8.4–10.5)
Glucose, Bld: 102 mg/dL — ABNORMAL HIGH (ref 70–99)
Potassium: 4.8 mEq/L (ref 3.5–5.1)
Sodium: 142 mEq/L (ref 135–145)

## 2010-12-30 ENCOUNTER — Encounter: Payer: Self-pay | Admitting: Internal Medicine

## 2010-12-30 DIAGNOSIS — E119 Type 2 diabetes mellitus without complications: Secondary | ICD-10-CM | POA: Insufficient documentation

## 2010-12-30 DIAGNOSIS — R7302 Impaired glucose tolerance (oral): Secondary | ICD-10-CM

## 2010-12-30 HISTORY — DX: Impaired glucose tolerance (oral): R73.02

## 2011-01-04 ENCOUNTER — Encounter: Payer: Self-pay | Admitting: Internal Medicine

## 2011-01-04 ENCOUNTER — Ambulatory Visit (INDEPENDENT_AMBULATORY_CARE_PROVIDER_SITE_OTHER): Payer: 59 | Admitting: Internal Medicine

## 2011-01-04 ENCOUNTER — Other Ambulatory Visit (INDEPENDENT_AMBULATORY_CARE_PROVIDER_SITE_OTHER): Payer: 59

## 2011-01-04 VITALS — BP 104/70 | HR 57 | Temp 97.9°F | Ht 69.0 in | Wt 224.4 lb

## 2011-01-04 DIAGNOSIS — Z0000 Encounter for general adult medical examination without abnormal findings: Secondary | ICD-10-CM

## 2011-01-04 DIAGNOSIS — Z0001 Encounter for general adult medical examination with abnormal findings: Secondary | ICD-10-CM | POA: Insufficient documentation

## 2011-01-04 DIAGNOSIS — K6289 Other specified diseases of anus and rectum: Secondary | ICD-10-CM

## 2011-01-04 LAB — PSA: PSA: 0.82 ng/mL (ref 0.10–4.00)

## 2011-01-04 MED ORDER — ROSUVASTATIN CALCIUM 20 MG PO TABS: 20.0000 mg | ORAL_TABLET | Freq: Every day | ORAL | Status: DC

## 2011-01-04 MED ORDER — FENOFIBRATE 160 MG PO TABS: 160.0000 mg | ORAL_TABLET | Freq: Every day | ORAL | Status: DC

## 2011-01-04 MED ORDER — METOPROLOL TARTRATE 25 MG PO TABS: 25.0000 mg | ORAL_TABLET | Freq: Every day | ORAL | Status: DC

## 2011-01-04 NOTE — Progress Notes (Signed)
Subjective:    Patient ID: Scott Valentine, male    DOB: 08-19-1956, 55 y.o.   MRN: [id number]  HPI  Here for wellness and f/u;  Overall doing ok;  Pt denies CP, worsening SOB, DOE, wheezing, orthopnea, PND, worsening LE edema, palpitations, dizziness or syncope.  Pt denies neurological change such as new Headache, facial or extremity weakness.  Pt denies polydipsia, polyuria, or low sugar symptoms. Pt states overall good compliance with treatment and medications, good tolerability, and trying to follow lower cholesterol diet.  Pt denies worsening depressive symptoms, suicidal ideation or panic. No fever, wt loss, night sweats, loss of appetite, or other constitutional symptoms.  Pt states good ability with ADL's, low fall risk, home safety reviewed and adequate, no significant changes in hearing or vision, and occasionally active with exercise.  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   Has ongoing problem with stool leakage, hemorrhoid and hx of recurrent chronic anal fissure, with occasional small blood.   Last colonscopy apr 2011.  Past Medical History  Diagnosis Date  . GLUCOSE INTOLERANCE 10/19/2008  . HYPERLIPIDEMIA 10/08/2007  . Other and unspecified hyperlipidemia 05/01/2007  . Dysmetabolic syndrome X 10/08/2007  . Anxiety state, unspecified 10/08/2007  . Unspecified essential hypertension 05/01/2007  . URI 10/29/2007  . GERD 05/23/2008  . HYPERPLASIA PROSTATE UNS W/O UR OBST & OTH LUTS 10/08/2007  . Cough 11/08/2009  . UNS ADVRS EFF UNS RX MEDICINAL&BIOLOGICAL SBSTNC 12/17/2007  . 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 TOBACCO USE, QUIT 10/08/2007  . Impaired glucose tolerance 12/30/2010   Past Surgical History  Procedure Date  . Finger surgury' 05/30/2008    reports that he has quit smoking. He does not have any smokeless tobacco history on file. He reports that he does not drink alcohol. His drug history not on  file. family history includes Coronary artery disease (age of onset:50) in his father; Diabetes in his mother; Heart attack in his paternal grandfather; Heart disease in his brother and father; Lung cancer in his father; Mental illness in his mother; and Prostate cancer in his paternal grandfather. Allergies  Allergen Reactions  . Atorvastatin     REACTION: myalgias  . Ezetimibe     REACTION: myalgias  . Sulfonamide Derivatives     REACTION: stevens johnson   Current Outpatient Prescriptions on File Prior to Visit  Medication Sig Dispense Refill  . aspirin 325 MG tablet Take 325 mg by mouth daily.        . CRESTOR 20 MG tablet TAKE  ONE TABLET BY MOUTH NIGHTLY AT BEDTIME  30 each  0  . fenofibrate 160 MG tablet Take 160 mg by mouth daily.        . fexofenadine (ALLEGRA) 180 MG tablet Take 180 mg by mouth daily as needed.        . fish oil-omega-3 fatty acids 1000 MG capsule Take 2 g by mouth daily.        . folic acid (FOLVITE) 1 MG tablet Take 1 mg by mouth daily.        Marland Kitchen LORazepam (ATIVAN) 0.5 MG tablet Take 0.5 mg by mouth daily as needed.        . metoprolol tartrate (LOPRESSOR) 25 MG tablet Take 25 mg by mouth daily.        . Multiple Vitamin (MULTIVITAMIN) tablet Take 1 tablet by mouth daily.        Marland Kitchen  rosuvastatin (CRESTOR) 20 MG tablet Take 20 mg by mouth daily.         Review of Systems Review of Systems  Constitutional: Negative for diaphoresis, activity change, appetite change and unexpected weight change.  HENT: Negative for hearing loss, ear pain, facial swelling, mouth sores and neck stiffness.   Eyes: Negative for pain, redness and visual disturbance.  Respiratory: Negative for shortness of breath and wheezing.   Cardiovascular: Negative for chest pain and palpitations.  Gastrointestinal: Negative for diarrhea, blood in stool, abdominal distention and rectal pain.  Genitourinary: Negative for hematuria, flank pain and decreased urine volume.  Musculoskeletal: Negative  for myalgias and joint swelling.  Skin: Negative for color change and wound.  Neurological: Negative for syncope and numbness.  Hematological: Negative for adenopathy.  Psychiatric/Behavioral: Negative for hallucinations, self-injury, decreased concentration and agitation.      Objective:   Physical Exam BP 104/70  Pulse 57  Temp(Src) 97.9 F (36.6 C) (Oral)  Ht 5\' 9"  (1.753 m)  Wt 224 lb 6 oz (101.776 kg)  BMI 33.13 kg/m2  SpO2 97% Physical Exam  VS noted Constitutional: Pt is oriented to person, place, and time. Appears well-developed and well-nourished.  HENT:  Head: Normocephalic and atraumatic.  Right Ear: External ear normal.  Left Ear: External ear normal.  Nose: Nose normal.  Mouth/Throat: Oropharynx is clear and moist.  Eyes: Conjunctivae and EOM are normal. Pupils are equal, round, and reactive to light.  Neck: Normal range of motion. Neck supple. No JVD present. No tracheal deviation present.  Cardiovascular: Normal rate, regular rhythm, normal heart sounds and intact distal pulses.   Pulmonary/Chest: Effort normal and breath sounds normal.  Abdominal: Soft. Bowel sounds are normal. There is no tenderness.  Musculoskeletal: Normal range of motion. Exhibits no edema.  Lymphadenopathy:  Has no cervical adenopathy.  Neurological: Pt is alert and oriented to person, place, and time. Pt has normal reflexes. No cranial nerve deficit.  Skin: Skin is warm and dry. No rash noted.  Psychiatric:  Has  normal mood and affect. Behavior is normal. except 2+ stressed, nervous        Assessment & Plan:

## 2011-01-04 NOTE — Progress Notes (Signed)
Quick Note:  Voice message left on [employer]Tree system - lab is negative, normal or otherwise stable, pt to continue same tx ______ 

## 2011-01-04 NOTE — Assessment & Plan Note (Signed)
With hemorrhoid, recurrent/chronic fissure, mild leakage - to surgury for evaluation

## 2011-01-04 NOTE — Patient Instructions (Addendum)
Please go to LAB in the Basement for the blood and/or urine tests to be done today Please call the number on the Blue Card (the [employer]Tree System) for results of testing in 2-3 days You will be contacted regarding the referral for: Gen Surgury The current medical regimen is effective;  continue present plan and medications. Please return in 1 year for your yearly visit, or sooner if needed, with Lab testing done 3-5 days before

## 2011-01-05 ENCOUNTER — Encounter: Payer: Self-pay | Admitting: Internal Medicine

## 2011-03-25 ENCOUNTER — Telehealth (INDEPENDENT_AMBULATORY_CARE_PROVIDER_SITE_OTHER): Payer: Self-pay | Admitting: General Surgery

## 2011-04-10 ENCOUNTER — Other Ambulatory Visit: Payer: Self-pay

## 2011-04-10 MED ORDER — LORAZEPAM 0.5 MG PO TABS: 0.5000 mg | ORAL_TABLET | Freq: Every day | ORAL | Status: DC | PRN

## 2011-04-10 NOTE — Telephone Encounter (Signed)
Rx faxed to pharmacy, pt advised of same.

## 2011-04-18 ENCOUNTER — Encounter (HOSPITAL_COMMUNITY): Payer: 59

## 2011-04-18 ENCOUNTER — Other Ambulatory Visit (INDEPENDENT_AMBULATORY_CARE_PROVIDER_SITE_OTHER): Payer: Self-pay | Admitting: Surgery

## 2011-04-18 ENCOUNTER — Ambulatory Visit (HOSPITAL_COMMUNITY)
Admission: RE | Admit: 2011-04-18 | Discharge: 2011-04-18 | Disposition: A | Discharge status: Home / Self Care | Payer: 59 | Source: Ambulatory Visit | Attending: Surgery | Admitting: Surgery

## 2011-04-18 DIAGNOSIS — I498 Other specified cardiac arrhythmias: Secondary | ICD-10-CM | POA: Insufficient documentation

## 2011-04-18 DIAGNOSIS — Z01812 Encounter for preprocedural laboratory examination: Secondary | ICD-10-CM | POA: Insufficient documentation

## 2011-04-18 DIAGNOSIS — K649 Unspecified hemorrhoids: Secondary | ICD-10-CM | POA: Insufficient documentation

## 2011-04-18 DIAGNOSIS — Z01810 Encounter for preprocedural cardiovascular examination: Secondary | ICD-10-CM | POA: Insufficient documentation

## 2011-04-18 DIAGNOSIS — Z01818 Encounter for other preprocedural examination: Secondary | ICD-10-CM | POA: Insufficient documentation

## 2011-04-18 DIAGNOSIS — K602 Anal fissure, unspecified: Secondary | ICD-10-CM | POA: Insufficient documentation

## 2011-04-18 LAB — CBC
Hemoglobin: 14.5 g/dL (ref 13.0–17.0)
MCH: 31 pg (ref 26.0–34.0)
MCV: 90.1 fL (ref 78.0–100.0)
RBC: 4.67 MIL/uL (ref 4.22–5.81)
WBC: 7.7 10*3/uL (ref 4.0–10.5)

## 2011-04-18 LAB — BASIC METABOLIC PANEL
CO2: 27 mEq/L (ref 19–32)
Calcium: 9.7 mg/dL (ref 8.4–10.5)
Chloride: 106 mEq/L (ref 96–112)
Glucose, Bld: 107 mg/dL — ABNORMAL HIGH (ref 70–99)
Sodium: 140 mEq/L (ref 135–145)

## 2011-04-18 LAB — SURGICAL PCR SCREEN: Staphylococcus aureus: INVALID — AB

## 2011-04-21 LAB — MRSA CULTURE

## 2011-04-25 ENCOUNTER — Other Ambulatory Visit (INDEPENDENT_AMBULATORY_CARE_PROVIDER_SITE_OTHER): Payer: Self-pay | Admitting: Surgery

## 2011-04-25 ENCOUNTER — Ambulatory Visit (HOSPITAL_COMMUNITY)
Admission: RE | Admit: 2011-04-25 | Discharge: 2011-04-25 | Disposition: A | Discharge status: Home / Self Care | Payer: 59 | Source: Ambulatory Visit | Attending: Surgery | Admitting: Surgery

## 2011-04-25 DIAGNOSIS — K602 Anal fissure, unspecified: Secondary | ICD-10-CM | POA: Insufficient documentation

## 2011-04-25 DIAGNOSIS — K648 Other hemorrhoids: Secondary | ICD-10-CM | POA: Insufficient documentation

## 2011-04-25 DIAGNOSIS — L511 Stevens-Johnson syndrome: Secondary | ICD-10-CM | POA: Insufficient documentation

## 2011-04-25 DIAGNOSIS — K649 Unspecified hemorrhoids: Secondary | ICD-10-CM

## 2011-04-25 DIAGNOSIS — E785 Hyperlipidemia, unspecified: Secondary | ICD-10-CM | POA: Insufficient documentation

## 2011-04-25 DIAGNOSIS — Z860100 Personal history of colon polyps, unspecified: Secondary | ICD-10-CM | POA: Insufficient documentation

## 2011-04-25 DIAGNOSIS — K219 Gastro-esophageal reflux disease without esophagitis: Secondary | ICD-10-CM | POA: Insufficient documentation

## 2011-04-25 HISTORY — PX: ANAL FISSURE REPAIR: SHX2312

## 2011-04-25 HISTORY — PX: HEMORRHOID SURGERY: SHX153

## 2011-04-28 NOTE — Op Note (Signed)
  NAME:  [name], [name] NO.:  [id number]  MEDICAL RECORD NO.:  [id number]  LOCATION:  DAY[facility]                         FACILITY:  [facility]  PHYSICIAN:  Abigail Miyamoto, M.D. DATE OF BIRTH:  [birth year]  DATE OF PROCEDURE: DATE OF DISCHARGE:  04/25/2011                              OPERATIVE REPORT   PREOPERATIVE DIAGNOSES: 1. Bleeding internal hemorrhoids. 2. Chronic anal fissure.  POSTOPERATIVE DIAGNOSES: 1. Bleeding internal hemorrhoids. 2. Chronic anal fissure.  PROCEDURE: 1. Examination under anesthesia. 2. Single-column internal and external hemorrhoidectomy. 3. Fissurectomy.  SURGEON:  Abigail Miyamoto, MD  ANESTHESIA:  General and Exparel.  ESTIMATED BLOOD LOSS:  Minimal.  FINDINGS:  The patient found to have a small anterior skin tag as well as a posterior skin tag.  The posterior skin tag was right adjacent to the chronic anal fissure and internal to this was a large internal hemorrhoid.  PROCEDURE IN DETAIL:  The patient brought to the operating room, identified as Scott Valentine.  He was placed supine on the operating table and general anesthesia was induced.  The patient was then placed in the prone position.  His buttocks were then taped apart and his perianal area was then prepped and draped in the usual sterile fashion. A retractor was inserted in the anal canal and circumferential inspection was taken.  The patient had a very small skin tag at the anterior midline.  On the posterior midline, he had a much larger skin tag.  It was right adjacent to a chronic anal fissure.  He also had a large internal hemorrhoidal column going up to an external hemorrhoid right at the fissure.  I first grasped the small skin tag at the anterior midline and excised this with the cautery.  Next, I was able to grasp the internal and external hemorrhoid and the chronic fissure tissue and skin tag all in one clamp.  I placed a 2-0 chromic suture proximal to  this in the anal canal.  I then excised this area completely going down through the mucosa with electrocautery.  The entire specimen was removed in one single piece and sent to pathology for evaluation.  I then closed the mucosal defect with a running interlocked 2-0 chromic suture.  I then placed a second 2-0 chromic suture and performed a running suture as well ensuring the closure.  I then again examined the anal canal circumferentially and saw no other abnormalities.  I then placed a beaded Gelfoam into the anal canal.  I then anesthetized the incisions with Exparel.  The patient tolerated the procedure well.  All counts were correct at the end of procedure.  The patient was then placed back into the supine position, and then extubated in the operating room and taken in a stable condition to the recovery room.     Abigail Miyamoto, M.D.     DB/MEDQ  D:  04/25/2011  T:  04/25/2011  Job:  [id number]  Electronically Signed by Abigail Miyamoto M.D. on 04/28/2011 05:40:29 PM

## 2011-04-29 ENCOUNTER — Encounter (INDEPENDENT_AMBULATORY_CARE_PROVIDER_SITE_OTHER): Payer: Self-pay | Admitting: General Surgery

## 2011-05-21 ENCOUNTER — Ambulatory Visit (INDEPENDENT_AMBULATORY_CARE_PROVIDER_SITE_OTHER): Payer: 59 | Admitting: Surgery

## 2011-05-21 ENCOUNTER — Encounter (INDEPENDENT_AMBULATORY_CARE_PROVIDER_SITE_OTHER): Payer: Self-pay | Admitting: Surgery

## 2011-05-21 VITALS — BP 156/90 | HR 72

## 2011-05-21 DIAGNOSIS — Z09 Encounter for follow-up examination after completed treatment for conditions other than malignant neoplasm: Secondary | ICD-10-CM

## 2011-05-21 NOTE — Progress Notes (Signed)
Subjective:     Patient ID: Scott Valentine, male   DOB: 15-Aug-1956, 55 y.o.   MRN: [id number]  HPI  He is here for his first postoperative visit status post hemorrhoidectomy and fissurectomy. He still has some mild to moderate discomfort as well as drainage. He remains on stool softener. He is still using the lidocaine cream but no longer is having to use narcotics. He has no continence issues Review of Systems     Objective:   Physical Exam    On exam, there is still an open area at the posterior midline with no evidence of infection. Assessment:     Patient status post hemorrhoidectomy and fissurectomy    Plan        I will see him back in one month. He will refrain from work until then. I renewed his lidocaine cream.

## 2011-06-07 ENCOUNTER — Telehealth (INDEPENDENT_AMBULATORY_CARE_PROVIDER_SITE_OTHER): Payer: Self-pay

## 2011-06-07 NOTE — Telephone Encounter (Signed)
Pt called and stated he had a painful lump at his rectum earlier this week and it burst.  It feels better now that the pressure is relieved.  He has no fever.  I told him this is probably normal postop swelling due to all he had done.  He will call me Monday if feels worse or no better.

## 2011-06-11 ENCOUNTER — Ambulatory Visit (INDEPENDENT_AMBULATORY_CARE_PROVIDER_SITE_OTHER): Payer: 59 | Admitting: Surgery

## 2011-06-11 ENCOUNTER — Encounter (INDEPENDENT_AMBULATORY_CARE_PROVIDER_SITE_OTHER): Payer: Self-pay | Admitting: Surgery

## 2011-06-11 VITALS — BP 136/82 | HR 72 | Temp 96.8°F | Resp 16 | Ht 69.0 in | Wt 233.1 lb

## 2011-06-11 DIAGNOSIS — L290 Pruritus ani: Secondary | ICD-10-CM | POA: Insufficient documentation

## 2011-06-11 DIAGNOSIS — K602 Anal fissure, unspecified: Secondary | ICD-10-CM

## 2011-06-11 NOTE — Patient Instructions (Signed)

## 2011-06-12 NOTE — Progress Notes (Signed)
Subjective:     Patient ID: Scott Valentine, male   DOB: 1956/01/03, 55 y.o.   MRN: [id number]  HPI  Patient Care Team: Oliver Barre, MD as PCP - General  This patient is a 55 y.o.male who presents today for surgical evaluation.   Procedure: Examination under anesthesia with hemorrhoidectomy and treatment of anal fissure 04/25/2011  Patient notes irritation. His main concern is he feels a mass where the fissure treatment was done.He's not having severe pain with bowel movements but it happens usually afterwards. He's been using a topical cream to help soothe it. Having 1-2 BMs a day. Try to keep them regular but has been a challenge. Because he was having discomfort, he came in for evaluation. He was worried he had a new fissure again.  He comes today with his wife.  Past Medical History  Diagnosis Date  . GLUCOSE INTOLERANCE 10/19/2008  . HYPERLIPIDEMIA 10/08/2007  . Other and unspecified hyperlipidemia 05/01/2007  . Dysmetabolic syndrome X 10/08/2007  . Anxiety state, unspecified 10/08/2007  . Unspecified essential hypertension 05/01/2007  . URI 10/29/2007  . GERD 05/23/2008  . HYPERPLASIA PROSTATE UNS W/O UR OBST & OTH LUTS 10/08/2007  . Cough 11/08/2009  . UNS ADVRS EFF UNS RX MEDICINAL&BIOLOGICAL SBSTNC 12/17/2007  . 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 TOBACCO USE, QUIT 10/08/2007  . Impaired glucose tolerance 12/30/2010  . Rectal bleeding     Past Surgical History  Procedure Date  . Finger surgury' 05/30/2008  . Hemorrhoid surgery     History   Social History  . Marital Status: Married    Spouse Name: N/A    Number of Children: 3  . Years of Education: N/A   Occupational History  . [employer]    Social History Main Topics  . Smoking status: Current Everyday Smoker -- 1.0 packs/day  . Smokeless tobacco: Not on file   Comment: Chantix  . Alcohol Use: No  . Drug Use: No  . Sexually Active: Not on file   Other Topics Concern  . Not on file   Social  History Narrative  . No narrative on file    Family History  Problem Relation Age of Onset  . Diabetes Mother   . Mental illness Mother   . Heart disease Father     MI x 2  . Coronary artery disease Father 15  . Lung cancer Father     pneumonectomy  . Heart disease Brother     heart mumur  . Heart attack Paternal Grandfather   . Prostate cancer Paternal Grandfather     Current outpatient prescriptions:aspirin 325 MG tablet, Take 325 mg by mouth daily.  , Disp: , Rfl: ;  fenofibrate 160 MG tablet, Take 1 tablet (160 mg total) by mouth daily., Disp: 90 tablet, Rfl: 3;  fexofenadine (ALLEGRA) 180 MG tablet, Take 180 mg by mouth daily as needed.  , Disp: , Rfl: ;  fish oil-omega-3 fatty acids 1000 MG capsule, Take 2 g by mouth daily.  , Disp: , Rfl:  folic acid (FOLVITE) 1 MG tablet, Take 1 mg by mouth daily.  , Disp: , Rfl: ;  HYDROcodone-acetaminophen (NORCO) 7.5-325 MG per tablet, Ad lib., Disp: , Rfl: ;  lidocaine (LINDAMANTLE) 3 % CREA cream, Ad lib., Disp: , Rfl: ;  LORazepam (ATIVAN) 0.5 MG tablet, Take 1 tablet (0.5 mg total) by mouth daily as needed., Disp: 90 tablet, Rfl: 1 metoprolol tartrate (LOPRESSOR) 25 MG tablet, Take 1 tablet (25  mg total) by mouth daily., Disp: 90 tablet, Rfl: 3;  Multiple Vitamin (MULTIVITAMIN) tablet, Take 1 tablet by mouth daily.  , Disp: , Rfl: ;  rosuvastatin (CRESTOR) 20 MG tablet, Take 1 tablet (20 mg total) by mouth daily., Disp: 90 tablet, Rfl: 3  Allergies  Allergen Reactions  . Atorvastatin     REACTION: myalgias  . Ezetimibe     REACTION: myalgias  . Sulfonamide Derivatives     REACTION: stevens johnson       Review of Systems  Constitutional: Negative for fever, chills and diaphoresis.  HENT: Negative for nosebleeds, sore throat, facial swelling, mouth sores, trouble swallowing and ear discharge.   Eyes: Negative for photophobia, discharge and visual disturbance.  Respiratory: Negative for choking, chest tightness, shortness of  breath and stridor.   Cardiovascular: Negative for chest pain and palpitations.  Gastrointestinal: Positive for rectal pain. Negative for nausea, vomiting, abdominal pain, diarrhea, constipation, blood in stool, abdominal distention and anal bleeding.  Genitourinary: Negative for dysuria, urgency, difficulty urinating and testicular pain.  Musculoskeletal: Negative for myalgias, back pain, arthralgias and gait problem.  Skin: Negative for color change, pallor, rash and wound.  Neurological: Negative for dizziness, speech difficulty, weakness, numbness and headaches.  Hematological: Negative for adenopathy. Does not bruise/bleed easily.  Psychiatric/Behavioral: Negative for hallucinations, confusion and agitation.       Objective:   Physical Exam  Constitutional: He is oriented to person, place, and time. He appears well-developed and well-nourished. No distress.  HENT:  Head: Normocephalic.  Mouth/Throat: Oropharynx is clear and moist. No oropharyngeal exudate.  Eyes: Conjunctivae and EOM are normal. Pupils are equal, round, and reactive to light. No scleral icterus.  Neck: Normal range of motion. No tracheal deviation present.  Cardiovascular: Normal rate, normal heart sounds and intact distal pulses.   Pulmonary/Chest: Effort normal. No respiratory distress.  Abdominal: Soft. He exhibits no distension. There is no tenderness. Hernia confirmed negative in the right inguinal area and confirmed negative in the left inguinal area.       Incisions clean with normal healing ridges.  No hernias  Genitourinary: Penis normal. No penile tenderness.       Perianal skin covered with yellow cream.  Mild/mod 3cm radial pruritis.  Mildly increased sphincter tone.  Old posterior midline anal fissuremostly epithelialized.  No abscess/fistula.  No pilonidal disease.    Tolerates digital rectal exam.  No rectal masses nor abscesses.  Hemorrhoidal piles mildly irritated.  No major prolapse nor bleeding.     Musculoskeletal: Normal range of motion. He exhibits no tenderness.  Neurological: He is alert and oriented to person, place, and time. No cranial nerve deficit. He exhibits normal muscle tone. Coordination normal.  Skin: Skin is warm and dry. No rash noted. He is not diaphoretic.  Psychiatric: He has a normal mood and affect. His behavior is normal.       Assessment:     Probable re flare of anal fissure with irregular bowels.  Pruritis ani   Plan:     The anatomy & physiology of the anorectal region was discussed.  The pathophysiology of hemorrhoids and differential diagnosis was discussed.  Natural history progression  was discussed.   I stressed the importance of a bowel regimen to have daily soft bowel movements to minimize progression of disease.   Use of wet wipes, warm baths, avoiding straining, etc were emphasized.  I wrote a prescription for diltiazem cream. Hopefully that'll help sphincter spasm calm down and allow the fissure  to continue to heal. I noted to do that for a few weeks.  I recommended that he try to avoid using perianal creams thereafter. I worry the chronic moisture is causing burning, irritation. Keep the cream just at the anus only and the rest of the area use a dry cotton balls or powders. Handout on pruritis ani also given  Educational handouts further explaining the pathology, treatment options, and bowel regimen were given as well.   The patient expressed understanding.

## 2011-06-20 ENCOUNTER — Encounter (INDEPENDENT_AMBULATORY_CARE_PROVIDER_SITE_OTHER): Payer: Self-pay | Admitting: Surgery

## 2011-06-21 ENCOUNTER — Ambulatory Visit (INDEPENDENT_AMBULATORY_CARE_PROVIDER_SITE_OTHER): Payer: 59 | Admitting: Surgery

## 2011-06-21 ENCOUNTER — Encounter (INDEPENDENT_AMBULATORY_CARE_PROVIDER_SITE_OTHER): Payer: Self-pay | Admitting: Surgery

## 2011-06-21 ENCOUNTER — Encounter (INDEPENDENT_AMBULATORY_CARE_PROVIDER_SITE_OTHER): Payer: 59 | Admitting: Surgery

## 2011-06-21 VITALS — BP 132/88 | HR 64 | Temp 98.4°F | Resp 16 | Ht 69.0 in | Wt 235.2 lb

## 2011-06-21 DIAGNOSIS — K645 Perianal venous thrombosis: Secondary | ICD-10-CM

## 2011-06-21 NOTE — Progress Notes (Signed)
Subjective:     Patient ID: Scott Valentine, male   DOB: 10-21-1955, 55 y.o.   MRN: [id number]  HPI He is here for another visit. He saw Dr. gross last week. He is feeling much better so did not actually even start using the diltiazem cream. He still has very mild perianal discomfort  Review of Systems     Objective:   Physical Exam On exam, the chronic fistula opening has questionably recurred. He does have a separate small thrombosed external hemorrhoid which I incised and drained today. Sphincter tone is normal    Assessment:     Patient status post fissurectomy and hemorrhoidectomy with now a separate thrombosed hemorrhoid    Plan:     At this point he will continue his current bowel regimen and hold off on a diltiazem. I will see him back next week

## 2011-06-24 ENCOUNTER — Encounter (INDEPENDENT_AMBULATORY_CARE_PROVIDER_SITE_OTHER): Payer: Self-pay | Admitting: Surgery

## 2011-06-25 ENCOUNTER — Ambulatory Visit (INDEPENDENT_AMBULATORY_CARE_PROVIDER_SITE_OTHER): Payer: 59 | Admitting: Surgery

## 2011-06-25 ENCOUNTER — Encounter (INDEPENDENT_AMBULATORY_CARE_PROVIDER_SITE_OTHER): Payer: Self-pay | Admitting: Surgery

## 2011-06-25 VITALS — BP 127/87 | HR 71 | Temp 98.8°F | Resp 15 | Ht 69.0 in | Wt 234.4 lb

## 2011-06-25 DIAGNOSIS — Z09 Encounter for follow-up examination after completed treatment for conditions other than malignant neoplasm: Secondary | ICD-10-CM

## 2011-06-25 NOTE — Progress Notes (Signed)
Subjective:     Patient ID: Scott Valentine, male   DOB: 11-11-1955, 55 y.o.   MRN: [id number]  HPI  He is here for a followup visit status post incision and drainage of the thrombosed hemorrhoid as well as reevaluation of his anal fistula. He is doing well has no complaints. Review of Systems     Objective:   Physical Exam    On exam, there still a tiny opening in the posterior midline. The thrombosed hemorrhoid site is healing well Assessment:     Patient status post incision and drainage of a thrombosed hemorrhoid as well as postoperative from anal fistula surgery    Plan:     I will see him back next week. He will continue his current bowel regimen. He will refrain from work until I see him back again.

## 2011-06-28 ENCOUNTER — Encounter (INDEPENDENT_AMBULATORY_CARE_PROVIDER_SITE_OTHER): Payer: Self-pay | Admitting: Surgery

## 2011-07-08 ENCOUNTER — Encounter (INDEPENDENT_AMBULATORY_CARE_PROVIDER_SITE_OTHER): Payer: Self-pay | Admitting: General Surgery

## 2011-07-08 ENCOUNTER — Encounter (INDEPENDENT_AMBULATORY_CARE_PROVIDER_SITE_OTHER): Payer: 59 | Admitting: Surgery

## 2011-07-09 ENCOUNTER — Encounter (INDEPENDENT_AMBULATORY_CARE_PROVIDER_SITE_OTHER): Payer: Self-pay | Admitting: Surgery

## 2011-07-15 ENCOUNTER — Ambulatory Visit (INDEPENDENT_AMBULATORY_CARE_PROVIDER_SITE_OTHER): Payer: 59 | Admitting: Surgery

## 2011-07-15 ENCOUNTER — Encounter (INDEPENDENT_AMBULATORY_CARE_PROVIDER_SITE_OTHER): Payer: Self-pay | Admitting: Surgery

## 2011-07-15 VITALS — BP 144/100 | HR 60 | Temp 97.7°F | Resp 20 | Ht 69.0 in | Wt 236.4 lb

## 2011-07-15 DIAGNOSIS — Z09 Encounter for follow-up examination after completed treatment for conditions other than malignant neoplasm: Secondary | ICD-10-CM

## 2011-07-15 NOTE — Progress Notes (Signed)
Subjective:     Patient ID: Scott Valentine, male   DOB: Jun 08, 1956, 55 y.o.   MRN: [id number]  HPI He is here for another followup visit. He has no complaints today. He is moving his bowels well.  Review of Systems     Objective:   Physical Exam On exam, the thrombosed hemorrhoid site is well healed. The fissure site is also    Assessment:     Patient status post drainage of thrombosed hemorrhoid and anal fissure    Plan:     He has returned to work. I will see him back as needed

## 2011-07-26 ENCOUNTER — Other Ambulatory Visit: Payer: Self-pay

## 2011-07-26 MED ORDER — FENOFIBRATE 160 MG PO TABS: 160.0000 mg | ORAL_TABLET | Freq: Every day | ORAL | Status: DC

## 2011-07-26 NOTE — Telephone Encounter (Signed)
Pt spouse called requesting Rx for fenofibrate be changed  To brand name because she has discount card for the brand name only. Request new Rx to [facility] pharmacy in [place].

## 2011-07-29 ENCOUNTER — Telehealth: Payer: Self-pay

## 2011-07-29 MED ORDER — FENOFIBRATE 145 MG PO TABS: 145.0000 mg | ORAL_TABLET | Freq: Every day | ORAL | Status: DC

## 2011-07-29 NOTE — Telephone Encounter (Signed)
Patients wife called to request Tricor 145 mg be sent to [facility].  The generic is more expensive than brand name.

## 2011-07-29 NOTE — Telephone Encounter (Signed)
Ok to change  - to robin to handle 

## 2011-07-29 NOTE — Telephone Encounter (Signed)
Pt's spouse called requesting a call back from [place] regarding this patient's medication.

## 2011-11-18 ENCOUNTER — Other Ambulatory Visit: Payer: Self-pay

## 2011-11-18 MED ORDER — LORAZEPAM 0.5 MG PO TABS: 0.5000 mg | ORAL_TABLET | Freq: Every day | ORAL | Status: DC | PRN

## 2011-11-18 NOTE — Telephone Encounter (Signed)
Done hardcopy to robin  

## 2011-11-18 NOTE — Telephone Encounter (Signed)
Faxed hardcopy to pharmacy. 

## 2011-12-09 ENCOUNTER — Telehealth: Payer: Self-pay

## 2011-12-09 DIAGNOSIS — Z0000 Encounter for general adult medical examination without abnormal findings: Secondary | ICD-10-CM

## 2011-12-09 NOTE — Telephone Encounter (Signed)
Put order in for physical labs. 

## 2012-01-04 ENCOUNTER — Other Ambulatory Visit: Payer: 59

## 2012-01-10 ENCOUNTER — Encounter: Payer: 59 | Admitting: Internal Medicine

## 2012-01-28 ENCOUNTER — Telehealth: Payer: Self-pay

## 2012-01-28 NOTE — Telephone Encounter (Signed)
Received PA for Tricor 145 mg proceed or offer alternative

## 2012-01-29 MED ORDER — FENOFIBRATE 160 MG PO TABS: 160.0000 mg | ORAL_TABLET | Freq: Every day | ORAL | Status: DC

## 2012-01-29 NOTE — Telephone Encounter (Signed)
Called the patient and the change is ok.  He is scheduled for his physical next Friday 02/07/12 and can discuss further then if any problems with new medication.

## 2012-01-29 NOTE — Telephone Encounter (Signed)
Ok to change to fenofibrate 160 as this is comparable - IF ok with pt  (I sent to pharmacy)

## 2012-01-31 ENCOUNTER — Other Ambulatory Visit (INDEPENDENT_AMBULATORY_CARE_PROVIDER_SITE_OTHER): Payer: 59

## 2012-01-31 DIAGNOSIS — Z0000 Encounter for general adult medical examination without abnormal findings: Secondary | ICD-10-CM

## 2012-01-31 LAB — LIPID PANEL
Cholesterol: 179 mg/dL (ref 0–200)
HDL: 33.3 mg/dL — ABNORMAL LOW (ref >39.00)
Total CHOL/HDL Ratio: 5
Triglycerides: 214 mg/dL — ABNORMAL HIGH (ref 0.0–149.0)

## 2012-01-31 LAB — BASIC METABOLIC PANEL
CO2: 26 mEq/L (ref 19–32)
Chloride: 107 mEq/L (ref 96–112)
Potassium: 5 mEq/L (ref 3.5–5.1)
Sodium: 142 mEq/L (ref 135–145)

## 2012-01-31 LAB — PSA: PSA: 0.87 ng/mL (ref 0.10–4.00)

## 2012-01-31 LAB — HEPATIC FUNCTION PANEL
ALT: 22 U/L (ref 0–53)
AST: 21 U/L (ref 0–37)
Alkaline Phosphatase: 35 U/L — ABNORMAL LOW (ref 39–117)
Bilirubin, Direct: 0.1 mg/dL (ref 0.0–0.3)
Total Bilirubin: 0.5 mg/dL (ref 0.3–1.2)
Total Protein: 7.3 g/dL (ref 6.0–8.3)

## 2012-01-31 LAB — URINALYSIS, ROUTINE W REFLEX MICROSCOPIC
Bilirubin Urine: NEGATIVE
Hgb urine dipstick: NEGATIVE
Nitrite: NEGATIVE
Total Protein, Urine: NEGATIVE
Urine Glucose: NEGATIVE
pH: 6 (ref 5.0–8.0)

## 2012-01-31 LAB — CBC WITH DIFFERENTIAL/PLATELET
Basophils Absolute: 0 10*3/uL (ref 0.0–0.1)
Eosinophils Absolute: 0.3 10*3/uL (ref 0.0–0.7)
Hemoglobin: 14.6 g/dL (ref 13.0–17.0)
Lymphocytes Relative: 17.1 % (ref 12.0–46.0)
MCHC: 34 g/dL (ref 30.0–36.0)
Monocytes Relative: 6 % (ref 3.0–12.0)
Neutro Abs: 5.6 10*3/uL (ref 1.4–7.7)
Neutrophils Relative %: 72.6 % (ref 43.0–77.0)
Platelets: 232 10*3/uL (ref 150.0–400.0)
RDW: 12.7 % (ref 11.5–14.6)

## 2012-02-04 ENCOUNTER — Other Ambulatory Visit: Payer: Self-pay | Admitting: Internal Medicine

## 2012-02-07 ENCOUNTER — Encounter: Payer: Self-pay | Admitting: Internal Medicine

## 2012-02-07 ENCOUNTER — Ambulatory Visit (INDEPENDENT_AMBULATORY_CARE_PROVIDER_SITE_OTHER): Payer: BC Managed Care – PPO | Admitting: Internal Medicine

## 2012-02-07 ENCOUNTER — Other Ambulatory Visit (INDEPENDENT_AMBULATORY_CARE_PROVIDER_SITE_OTHER): Payer: BC Managed Care – PPO

## 2012-02-07 VITALS — BP 112/76 | HR 53 | Temp 97.4°F | Ht 69.0 in | Wt 229.2 lb

## 2012-02-07 DIAGNOSIS — R6882 Decreased libido: Secondary | ICD-10-CM

## 2012-02-07 DIAGNOSIS — E785 Hyperlipidemia, unspecified: Secondary | ICD-10-CM

## 2012-02-07 DIAGNOSIS — R7309 Other abnormal glucose: Secondary | ICD-10-CM

## 2012-02-07 DIAGNOSIS — R7302 Impaired glucose tolerance (oral): Secondary | ICD-10-CM

## 2012-02-07 DIAGNOSIS — Z0000 Encounter for general adult medical examination without abnormal findings: Secondary | ICD-10-CM

## 2012-02-07 MED ORDER — ROSUVASTATIN CALCIUM 40 MG PO TABS: 40.0000 mg | ORAL_TABLET | Freq: Every day | ORAL | Status: DC

## 2012-02-07 MED ORDER — LORAZEPAM 0.5 MG PO TABS: 0.5000 mg | ORAL_TABLET | Freq: Every day | ORAL | Status: DC | PRN

## 2012-02-07 MED ORDER — FENOFIBRATE 160 MG PO TABS: 160.0000 mg | ORAL_TABLET | Freq: Every day | ORAL | Status: DC

## 2012-02-07 NOTE — Patient Instructions (Addendum)
Continue all other medications as before except to increase the crestor to 40 mg per day Your refills were done as requested today (with the ativan done hardcopy) Please have the pharmacy call with any refills you may need. Please continue your efforts at being more active, low cholesterol diet, and weight control. Please stop smoking Your EKG was ok today Please go to LAB in the Basement for the blood and/or urine tests to be done today You will be contacted by phone if any changes need to be made immediately.  Otherwise, you will receive a letter about your results with an explanation. Please return in 1 year for your yearly visit, or sooner if needed, with Lab testing done 3-5 days before

## 2012-02-07 NOTE — Progress Notes (Signed)
Subjective:    Patient ID: Scott Valentine, male    DOB: [birth year], 56 y.o.   MRN: [id number]  HPI  Here for wellness and f/u;  Overall doing ok;  Pt denies CP, worsening SOB, DOE, wheezing, orthopnea, PND, worsening LE edema, palpitations, dizziness or syncope.  Pt denies neurological change such as new Headache, facial or extremity weakness.  Pt denies polydipsia, polyuria, or low sugar symptoms. Pt states overall good compliance with treatment and medications, good tolerability, and trying to follow lower cholesterol diet.  Pt denies worsening depressive symptoms, suicidal ideation or panic. No fever, wt loss, night sweats, loss of appetite, or other constitutional symptoms.  Pt states good ability with ADL's, low fall risk, home safety reviewed and adequate, no significant changes in hearing or vision, and occasionally active with exercise.  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  Hard to eat as well as he wants, though stress level is improved, and does now have more standing/walking, in and out of car, with occasional cramp to the distal RLE and ankle pain only, and has calouses and warts to the right foot as well, plans to see podiatry.  Wears an ankle support/brace OTC and seems to help. No swelling, falls. Past Medical History  Diagnosis Date  . GLUCOSE INTOLERANCE 10/19/2008  . HYPERLIPIDEMIA 10/08/2007  . Other and unspecified hyperlipidemia 05/01/2007  . Dysmetabolic syndrome X 10/08/2007  . Anxiety state, unspecified 10/08/2007  . Unspecified essential hypertension 05/01/2007  . URI 10/29/2007  . GERD 05/23/2008  . HYPERPLASIA PROSTATE UNS W/O UR OBST & OTH LUTS 10/08/2007  . Cough 11/08/2009  . UNS ADVRS EFF  UNS RX MEDICINAL&BIOLOGICAL SBSTNC 12/17/2007  . 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 TOBACCO USE, QUIT 10/08/2007  . Impaired glucose tolerance 12/30/2010  . Rectal bleeding    Past Surgical History  Procedure Date  . Finger surgury' 05/30/2008  . Hemorrhoid surgery 04/25/11  . Anal fissure repair 04/25/11    reports that he has been smoking.  He does not have any smokeless tobacco history on file. He reports that he does not drink alcohol or use illicit drugs. family history includes Coronary artery disease (age of onset:50) in his father; Diabetes in his mother; Heart attack in his paternal grandfather; Heart disease in his brother and father; Lung cancer in his father; Mental illness in his mother; and Prostate cancer in his paternal grandfather. Allergies  Allergen Reactions  . Atorvastatin     REACTION: myalgias  . Ezetimibe     REACTION: myalgias  . Sulfonamide Derivatives     REACTION: stevens johnson   Current Outpatient Prescriptions on File Prior to Visit  Medication Sig Dispense Refill  . aspirin 325 MG tablet Take 325 mg by mouth daily.        . CRESTOR 20 MG tablet TAKE ONE TABLET BY MOUTH ONE TIME DAILY  30 each  1  . fenofibrate 160 MG tablet Take 1 tablet (160 mg total) by mouth daily.  90 tablet  3  . fexofenadine (ALLEGRA) 180 MG tablet Take 180 mg by mouth daily as needed.        . fish oil-omega-3 fatty acids 1000 MG  capsule Take 2 g by mouth daily.        . folic acid (FOLVITE) 1 MG tablet Take 1 mg by mouth daily.        Marland Kitchen LORazepam (ATIVAN) 0.5 MG tablet Take 1 tablet (0.5 mg total) by mouth daily as needed.  90 tablet  0  . metoprolol tartrate (LOPRESSOR) 25 MG tablet Take 1 tablet (25 mg total) by mouth daily.  90 tablet  3  . Multiple Vitamin (MULTIVITAMIN) tablet Take 1 tablet by mouth daily.        Marland Kitchen HYDROcodone-acetaminophen (NORCO) 7.5-325 MG per tablet Ad lib.      Marland Kitchen lidocaine (LINDAMANTLE) 3 % CREA cream Ad lib.        Review of Systems Review  of Systems  Constitutional: Negative for diaphoresis, activity change, appetite change and unexpected weight change.  HENT: Negative for hearing loss, ear pain, facial swelling, mouth sores and neck stiffness.   Eyes: Negative for pain, redness and visual disturbance.  Respiratory: Negative for shortness of breath and wheezing.   Cardiovascular: Negative for chest pain and palpitations.  Gastrointestinal: Negative for diarrhea, blood in stool, abdominal distention and rectal pain.  Genitourinary: Negative for hematuria, flank pain and decreased urine volume.  Musculoskeletal: Negative for myalgias and joint swelling.  Skin: Negative for color change and wound.  Neurological: Negative for syncope and numbness.  Hematological: Negative for adenopathy.  Psychiatric/Behavioral: Negative for hallucinations, self-injury, decreased concentration and agitation.      Objective:   Physical Exam BP 112/76  Pulse 53  Temp(Src) 97.4 F (36.3 C) (Oral)  Ht 5\' 9"  (1.753 m)  Wt 229 lb 4 oz (103.987 kg)  BMI 33.85 kg/m2  SpO2 97% Physical Exam  VS noted Constitutional: Pt is oriented to person, place, and time. Appears well-developed and well-nourished.  HENT:  Head: Normocephalic and atraumatic.  Right Ear: External ear normal.  Left Ear: External ear normal.  Nose: Nose normal.  Mouth/Throat: Oropharynx is clear and moist.  Eyes: Conjunctivae and EOM are normal. Pupils are equal, round, and reactive to light.  Neck: Normal range of motion. Neck supple. No JVD present. No tracheal deviation present.  Cardiovascular: Normal rate, regular rhythm, normal heart sounds and intact distal pulses.   Pulmonary/Chest: Effort normal and breath sounds normal.  Abdominal: Soft. Bowel sounds are normal. There is no tenderness.  Musculoskeletal: Normal range of motion. Exhibits no edema.  Lymphadenopathy:  Has no cervical adenopathy.  Neurological: Pt is alert and oriented to person, place, and time. Pt has  normal reflexes. No cranial nerve deficit.  Skin: Skin is warm and dry. No rash noted.  Psychiatric:  Has  normal mood and affect. Behavior is normal.  Right ankle FROM, NT, no effusio    Assessment & Plan:

## 2012-02-07 NOTE — Assessment & Plan Note (Signed)
Uncontrolled, to incr the crestor to 40 mg,  to f/u any worsening symptoms or concerns

## 2012-02-07 NOTE — Assessment & Plan Note (Signed)

## 2012-02-08 ENCOUNTER — Encounter: Payer: Self-pay | Admitting: Internal Medicine

## 2012-02-08 MED ORDER — METOPROLOL TARTRATE 25 MG PO TABS: 25.0000 mg | ORAL_TABLET | Freq: Every day | ORAL | Status: DC

## 2012-02-08 NOTE — Assessment & Plan Note (Signed)
Asmpt, for a1c

## 2012-02-08 NOTE — Assessment & Plan Note (Signed)
Ok to check testosterone,  to f/u any worsening symptoms or concerns

## 2012-02-10 ENCOUNTER — Encounter: Payer: Self-pay | Admitting: Internal Medicine

## 2012-02-10 LAB — TESTOSTERONE, FREE, TOTAL, SHBG
Sex Hormone Binding: 42 nmol/L (ref 13–71)
Testosterone-% Free: 1.8 % (ref 1.6–2.9)
Testosterone: 525.11 ng/dL (ref 300–890)

## 2012-06-08 ENCOUNTER — Other Ambulatory Visit: Payer: Self-pay

## 2012-06-08 MED ORDER — LORAZEPAM 0.5 MG PO TABS: 0.5000 mg | ORAL_TABLET | Freq: Every day | ORAL | Status: DC | PRN

## 2012-06-08 NOTE — Telephone Encounter (Signed)
Done hardcopy to robin  

## 2012-06-09 NOTE — Telephone Encounter (Signed)
Faxed hardcopy to pharmacy. 

## 2012-08-27 ENCOUNTER — Other Ambulatory Visit: Payer: Self-pay

## 2012-08-27 MED ORDER — LORAZEPAM 0.5 MG PO TABS: 0.5000 mg | ORAL_TABLET | Freq: Every day | ORAL | Status: DC | PRN

## 2012-08-27 NOTE — Telephone Encounter (Signed)
Done hardcopy to robin  

## 2012-08-27 NOTE — Telephone Encounter (Signed)
Faxed hardcopy to pharmacy. 

## 2012-11-09 ENCOUNTER — Ambulatory Visit: Payer: BC Managed Care – PPO | Admitting: Sports Medicine

## 2012-11-11 ENCOUNTER — Ambulatory Visit (INDEPENDENT_AMBULATORY_CARE_PROVIDER_SITE_OTHER): Payer: BC Managed Care – PPO | Admitting: Sports Medicine

## 2012-11-11 VITALS — BP 146/91 | Ht 69.0 in | Wt 238.0 lb

## 2012-11-11 DIAGNOSIS — M722 Plantar fascial fibromatosis: Secondary | ICD-10-CM

## 2012-11-12 ENCOUNTER — Ambulatory Visit: Payer: BC Managed Care – PPO | Admitting: Sports Medicine

## 2012-11-15 NOTE — Progress Notes (Signed)
  Subjective:    Patient ID: Scott Valentine, male    DOB: [birth year], 57 y.o.   MRN: [id number]  HPI chief complaint: Right foot pain  Very pleasant 57 year old male comes in today complaining of 3-4 weeks of right foot pain. Pain is mainly along the arch of the foot. Worse with standing. Also worse with first getting up in the morning. Last week he felt a twinge in the arch of his foot which caused an acute increase in his pain. Since then his symptoms have improved slightly. No pain at rest. He is here today with his wife.  Medical history and current medications are reviewed Medical history is significant for a previous episode of Stevens-Johnson syndrome due to sulfa drugs Socially he smokes a pack of cigarettes a day, denies alcohol use, and works as a Technical sales engineer    Review of Systems     Objective:   Physical Exam Overweight, no acute distress. Awake alert and oriented x3  Right foot: There is significant callus formation both at the heel as well as along the fifth MTP metatarsal head. He is tender to palpation along the mid substance of the plantar fascia. Mild pain with plantar fascial stretch. Mild soft tissue swelling along the arch of the foot. No erythema. No tenderness to palpation at the calcaneal insertion. Neurovascularly intact distally. Patient walks with a slight limp.  MSK ultrasound of the right heel: There is significant hypoechogenicity at the origin of the plantar fascia at the calcaneus. In fact, it is difficult to visualize the plantar fascia in this area. Images were obtained to the uninvolved left heel which shows an intact plantar fascia at the calcaneus. These findings make me suspicious of a partial plantar fascial rupture in the right heel. There is not a significant amount of increased neurovascularity.       Assessment & Plan:  1. Right foot pain possibly secondary to partial plantar fascial rupture   Patient was given an arch strap and green  sports insoles with scaphoid pads. Daily icing. Followup in 4 weeks. If symptoms do not appear to be significant enough to warrant immobilization. Patient would benefit greatly from a custom orthotic at some point in time. He and his wife will call with questions or concerns prior to his followup visit.

## 2012-12-09 ENCOUNTER — Encounter: Payer: Self-pay | Admitting: Family Medicine

## 2012-12-09 ENCOUNTER — Ambulatory Visit (INDEPENDENT_AMBULATORY_CARE_PROVIDER_SITE_OTHER): Payer: BC Managed Care – PPO | Admitting: Family Medicine

## 2012-12-09 VITALS — BP 132/87 | HR 66 | Ht 69.0 in | Wt 238.0 lb

## 2012-12-09 DIAGNOSIS — M722 Plantar fascial fibromatosis: Secondary | ICD-10-CM

## 2012-12-09 NOTE — Patient Instructions (Addendum)
Thank you for coming in today. This will improve.  In 4 weeks if you are not almost all the way better come back.

## 2012-12-10 NOTE — Progress Notes (Signed)
Scott Valentine is a 57 y.o. male who presents to [facility] today for followup right plantar fascial rupture. Patient was last seen on March 5 with plantar fascial rupture. He was treated with arch straps arch support ice and relative rest. In the interim he notes approximately 50% improvement. He notes gradual improvement every day. 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 He feels well with no fevers chills radiating pain weakness or numbness. He is pleased with how things are going.    PMH reviewed. Obesity History  Substance Use Topics  . Smoking status: Current Every Day Smoker -- 1.00 packs/day  . Smokeless tobacco: Not on file     Comment: Chantix  . Alcohol Use: No   ROS as above otherwise neg   Exam:  BP 132/87  Pulse 66  Ht 5\' 9"  (1.753 m)  Wt 238 lb (107.956 kg)  BMI 35.13 kg/m2 Gen: Well NAD MSK: Right foot: There is significant callus formation both at the heel as well as along the fifth MTP metatarsal head. He is mildly tender to palpation along the mid substance of the plantar fascia. Mild pain with plantar fascial stretch. Mild soft tissue swelling along the arch of the foot. No erythema. No tenderness to palpation at the calcaneal insertion. Neurovascularly intact distally.  Patient walks normally

## 2012-12-10 NOTE — Assessment & Plan Note (Signed)
Right plantar fascia rupture.  Improving with arch straps and supportive and soles as well as relative rest and time Discussed icing protocol Followup as needed

## 2013-02-19 ENCOUNTER — Encounter: Payer: BC Managed Care – PPO | Admitting: Internal Medicine

## 2013-03-04 ENCOUNTER — Other Ambulatory Visit (INDEPENDENT_AMBULATORY_CARE_PROVIDER_SITE_OTHER): Payer: BC Managed Care – PPO

## 2013-03-04 DIAGNOSIS — Z0000 Encounter for general adult medical examination without abnormal findings: Secondary | ICD-10-CM

## 2013-03-04 DIAGNOSIS — R7302 Impaired glucose tolerance (oral): Secondary | ICD-10-CM

## 2013-03-04 DIAGNOSIS — R7309 Other abnormal glucose: Secondary | ICD-10-CM

## 2013-03-04 LAB — LIPID PANEL
HDL: 31.5 mg/dL — ABNORMAL LOW (ref >39.00)
Total CHOL/HDL Ratio: 4
Triglycerides: 217 mg/dL — ABNORMAL HIGH (ref 0.0–149.0)

## 2013-03-04 LAB — URINALYSIS, ROUTINE W REFLEX MICROSCOPIC
Bilirubin Urine: NEGATIVE
Leukocytes, UA: NEGATIVE
Nitrite: NEGATIVE
Specific Gravity, Urine: 1.015 (ref 1.000–1.030)
pH: 5.5 (ref 5.0–8.0)

## 2013-03-04 LAB — BASIC METABOLIC PANEL
CO2: 26 mEq/L (ref 19–32)
Chloride: 105 mEq/L (ref 96–112)
Creatinine, Ser: 1.1 mg/dL (ref 0.4–1.5)
Sodium: 140 mEq/L (ref 135–145)

## 2013-03-04 LAB — PSA: PSA: 0.81 ng/mL (ref 0.10–4.00)

## 2013-03-04 LAB — CBC WITH DIFFERENTIAL/PLATELET
Basophils Absolute: 0.1 10*3/uL (ref 0.0–0.1)
Eosinophils Absolute: 0.3 10*3/uL (ref 0.0–0.7)
Hemoglobin: 14 g/dL (ref 13.0–17.0)
Lymphocytes Relative: 21.1 % (ref 12.0–46.0)
MCHC: 35.2 g/dL (ref 30.0–36.0)
Neutro Abs: 4.7 10*3/uL (ref 1.4–7.7)
Platelets: 200 10*3/uL (ref 150.0–400.0)
RDW: 12.8 % (ref 11.5–14.6)

## 2013-03-04 LAB — HEMOGLOBIN A1C: Hgb A1c MFr Bld: 7.1 % — ABNORMAL HIGH (ref 4.6–6.5)

## 2013-03-04 LAB — HEPATIC FUNCTION PANEL
Albumin: 4.2 g/dL (ref 3.5–5.2)
Alkaline Phosphatase: 37 U/L — ABNORMAL LOW (ref 39–117)

## 2013-03-11 ENCOUNTER — Ambulatory Visit (INDEPENDENT_AMBULATORY_CARE_PROVIDER_SITE_OTHER): Payer: BC Managed Care – PPO | Admitting: Internal Medicine

## 2013-03-11 ENCOUNTER — Encounter: Payer: Self-pay | Admitting: Internal Medicine

## 2013-03-11 VITALS — BP 122/72 | HR 75 | Temp 98.3°F | Ht 69.0 in | Wt 232.4 lb

## 2013-03-11 DIAGNOSIS — F411 Generalized anxiety disorder: Secondary | ICD-10-CM

## 2013-03-11 DIAGNOSIS — Z0000 Encounter for general adult medical examination without abnormal findings: Secondary | ICD-10-CM

## 2013-03-11 MED ORDER — LORAZEPAM 0.5 MG PO TABS: 0.5000 mg | ORAL_TABLET | Freq: Every day | ORAL | Status: DC | PRN

## 2013-03-11 MED ORDER — METOPROLOL TARTRATE 25 MG PO TABS: 25.0000 mg | ORAL_TABLET | Freq: Every day | ORAL | Status: DC

## 2013-03-11 MED ORDER — FOLIC ACID 1 MG PO TABS: 1.0000 mg | ORAL_TABLET | Freq: Every day | ORAL | Status: DC

## 2013-03-11 MED ORDER — FEXOFENADINE HCL 180 MG PO TABS: 180.0000 mg | ORAL_TABLET | Freq: Every day | ORAL | Status: DC | PRN

## 2013-03-11 MED ORDER — FENOFIBRATE 160 MG PO TABS: 160.0000 mg | ORAL_TABLET | Freq: Every day | ORAL | Status: DC

## 2013-03-11 MED ORDER — ESCITALOPRAM OXALATE 10 MG PO TABS: 10.0000 mg | ORAL_TABLET | Freq: Every day | ORAL | Status: DC

## 2013-03-11 MED ORDER — ROSUVASTATIN CALCIUM 40 MG PO TABS: 40.0000 mg | ORAL_TABLET | Freq: Every day | ORAL | Status: DC

## 2013-03-11 NOTE — Progress Notes (Signed)
Subjective:    Patient ID: Scott Valentine, male    DOB: 12/12/1955, 57 y.o.   MRN: [id number]  HPI  Here for wellness and f/u;  Overall doing ok;  Pt denies CP, worsening SOB, DOE, wheezing, orthopnea, PND, worsening LE edema, palpitations, dizziness or syncope.  Pt denies neurological change such as new headache, facial or extremity weakness.  Pt denies polydipsia, polyuria, or low sugar symptoms. Pt states overall good compliance with treatment and medications, good tolerability, and has been trying to follow lower cholesterol diet.  Pt denies worsening depressive symptoms, suicidal ideation or panic. No fever, night sweats, wt loss, loss of appetite, or other constitutional symptoms.  Pt states good ability with ADL's, has low fall risk, home safety reviewed and adequate, no other significant changes in hearing or vision, and only occasionally active with exercise.  Is trying to do better with diet but drives 4 hours per day to and from work now.  Has several callouses to feet bilat, also has some plantar fasciitis better with elastic bands to mid foot, improved after seen Dr Darrick Penna. Also some occas cramping to the right calf, but willing to cont the crestor.  Asks for further tx for nerves due to stressful job, doesnot want to take lorazepan regularly.  ALso menitions s/p rectal surgury 2010 as some intermittent slight rectal incontinence, no blood.   Past Medical History  Diagnosis Date  . GLUCOSE INTOLERANCE 10/19/2008  . HYPERLIPIDEMIA 10/08/2007  . Other and unspecified hyperlipidemia 05/01/2007  . Dysmetabolic syndrome X 10/08/2007  . Anxiety state, unspecified 10/08/2007  . Unspecified essential hypertension 05/01/2007  . URI 10/29/2007  . GERD 05/23/2008  . HYPERPLASIA PROSTATE UNS W/O UR OBST & OTH LUTS 10/08/2007  . Cough 11/08/2009  . UNS ADVRS EFF UNS RX MEDICINAL&BIOLOGICAL SBSTNC 12/17/2007  . 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 TOBACCO USE, QUIT 10/08/2007  . Impaired glucose  tolerance 12/30/2010  . Rectal bleeding    Past Surgical History  Procedure Laterality Date  . Finger surgury'  05/30/2008  . Hemorrhoid surgery  04/25/11  . Anal fissure repair  04/25/11    reports that he has been smoking.  He does not have any smokeless tobacco history on file. He reports that he does not drink alcohol or use illicit drugs. family history includes Coronary artery disease (age of onset: 47) in his father; Diabetes in his mother; Heart attack in his paternal grandfather; Heart disease in his brother and father; Lung cancer in his father; Mental illness in his mother; and Prostate cancer in his paternal grandfather. Allergies  Allergen Reactions  . Atorvastatin     REACTION: myalgias  . Ezetimibe     REACTION: myalgias  . Sulfonamide Derivatives     REACTION: stevens johnson   Current Outpatient Prescriptions on File Prior to Visit  Medication Sig Dispense Refill  . aspirin 325 MG tablet Take 325 mg by mouth daily.        . fish oil-omega-3 fatty acids 1000 MG capsule Take 2 g by mouth daily.        Marland Kitchen LORazepam (ATIVAN) 0.5 MG tablet Take 1 tablet (0.5 mg total) by mouth daily as needed.  90 tablet  1  . Multiple Vitamin (MULTIVITAMIN) tablet Take 1 tablet by mouth daily.         No current facility-administered medications on file prior to visit.   Review of Systems Constitutional: Negative for diaphoresis, activity change, appetite change or unexpected weight change.  HENT:  Negative for hearing loss, ear pain, facial swelling, mouth sores and neck stiffness.   Eyes: Negative for pain, redness and visual disturbance.  Respiratory: Negative for shortness of breath and wheezing.   Cardiovascular: Negative for chest pain and palpitations.  Gastrointestinal: Negative for diarrhea, blood in stool, abdominal distention or other pain Genitourinary: Negative for hematuria, flank pain or change in urine volume.  Musculoskeletal: Negative for myalgias and joint swelling.   Skin: Negative for color change and wound.  Neurological: Negative for syncope and numbness. other than noted Hematological: Negative for adenopathy.  Psychiatric/Behavioral: Negative for hallucinations, self-injury, decreased concentration and agitation.      Objective:   Physical Exam BP 122/72  Pulse 75  Temp(Src) 98.3 F (36.8 C) (Oral)  Ht 5\' 9"  (1.753 m)  Wt 232 lb 6 oz (105.405 kg)  BMI 34.3 kg/m2  SpO2 97% VS noted,  Constitutional: Pt is oriented to person, place, and time. Appears well-developed and well-nourished. Lavella Lemons Head: Normocephalic and atraumatic.  Right Ear: External ear normal.  Left Ear: External ear normal.  Nose: Nose normal.  Mouth/Throat: Oropharynx is clear and moist.  Eyes: Conjunctivae and EOM are normal. Pupils are equal, round, and reactive to light.  Neck: Normal range of motion. Neck supple. No JVD present. No tracheal deviation present.  Cardiovascular: Normal rate, regular rhythm, normal heart sounds and intact distal pulses.   Pulmonary/Chest: Effort normal and breath sounds normal.  Abdominal: Soft. Bowel sounds are normal. There is no tenderness. No HSM  Musculoskeletal: Normal range of motion. Exhibits no edema.  Lymphadenopathy:  Has no cervical adenopathy.  Neurological: Pt is alert and oriented to person, place, and time. Pt has normal reflexes. No cranial nerve deficit.  Skin: Skin is warm and dry. No rash noted.  Psychiatric:  Has  normal mood and affect. Behavior is normal. mild nervous    Assessment & Plan:

## 2013-03-11 NOTE — Patient Instructions (Signed)
Please take all new medication as prescribed - the generic lexapro 10 mg per day Please continue all other medications as before, and refills have been done if requested - the lorazepam Please have the pharmacy call with any other refills you may need. Please continue your efforts at being more active, low cholesterol diet, and weight control. You are otherwise up to date with prevention measures today.  Please remember to sign up for My Chart if you have not done so, as this will be important to you in the future with finding out test results, communicating by private email, and scheduling acute appointments online when needed.  Please return in 1 year for your yearly visit, or sooner if needed, with Lab testing done 3-5 days before

## 2013-03-11 NOTE — Assessment & Plan Note (Signed)

## 2013-03-12 NOTE — Assessment & Plan Note (Signed)
Ok to add lexapro 10 qd 

## 2013-07-07 ENCOUNTER — Ambulatory Visit (INDEPENDENT_AMBULATORY_CARE_PROVIDER_SITE_OTHER): Payer: BC Managed Care – PPO

## 2013-07-07 DIAGNOSIS — Z23 Encounter for immunization: Secondary | ICD-10-CM

## 2014-03-10 ENCOUNTER — Ambulatory Visit: Payer: BC Managed Care – PPO

## 2014-03-10 ENCOUNTER — Other Ambulatory Visit (INDEPENDENT_AMBULATORY_CARE_PROVIDER_SITE_OTHER): Payer: BC Managed Care – PPO

## 2014-03-10 DIAGNOSIS — Z0000 Encounter for general adult medical examination without abnormal findings: Secondary | ICD-10-CM

## 2014-03-10 LAB — BASIC METABOLIC PANEL
BUN: 15 mg/dL (ref 6–23)
CALCIUM: 10.1 mg/dL (ref 8.4–10.5)
CO2: 29 mEq/L (ref 19–32)
Chloride: 105 mEq/L (ref 96–112)
Creatinine, Ser: 1 mg/dL (ref 0.4–1.5)
GFR: 80.23 mL/min (ref >60.00)
GLUCOSE: 150 mg/dL — AB (ref 70–99)
Potassium: 4.8 mEq/L (ref 3.5–5.1)
SODIUM: 139 meq/L (ref 135–145)

## 2014-03-10 LAB — LIPID PANEL
Cholesterol: 153 mg/dL (ref 0–200)
HDL: 33.7 mg/dL — AB (ref >39.00)
LDL Cholesterol: 79 mg/dL (ref 0–99)
NonHDL: 119.3
TRIGLYCERIDES: 200 mg/dL — AB (ref 0.0–149.0)
Total CHOL/HDL Ratio: 5
VLDL: 40 mg/dL (ref 0.0–40.0)

## 2014-03-10 LAB — CBC WITH DIFFERENTIAL/PLATELET
BASOS PCT: 0.9 % (ref 0.0–3.0)
Basophils Absolute: 0.1 10*3/uL (ref 0.0–0.1)
EOS PCT: 4.1 % (ref 0.0–5.0)
Eosinophils Absolute: 0.3 10*3/uL (ref 0.0–0.7)
HEMATOCRIT: 42.7 % (ref 39.0–52.0)
HEMOGLOBIN: 14.8 g/dL (ref 13.0–17.0)
Lymphocytes Relative: 21.1 % (ref 12.0–46.0)
Lymphs Abs: 1.4 10*3/uL (ref 0.7–4.0)
MCHC: 34.7 g/dL (ref 30.0–36.0)
MCV: 90.4 fl (ref 78.0–100.0)
MONO ABS: 0.5 10*3/uL (ref 0.1–1.0)
Monocytes Relative: 6.9 % (ref 3.0–12.0)
NEUTROS ABS: 4.5 10*3/uL (ref 1.4–7.7)
NEUTROS PCT: 67 % (ref 43.0–77.0)
Platelets: 220 10*3/uL (ref 150.0–400.0)
RBC: 4.72 Mil/uL (ref 4.22–5.81)
RDW: 13 % (ref 11.5–15.5)
WBC: 6.7 10*3/uL (ref 4.0–10.5)

## 2014-03-10 LAB — URINALYSIS, ROUTINE W REFLEX MICROSCOPIC
BILIRUBIN URINE: NEGATIVE
HGB URINE DIPSTICK: NEGATIVE
KETONES UR: NEGATIVE
Leukocytes, UA: NEGATIVE
Nitrite: NEGATIVE
Specific Gravity, Urine: 1.02 (ref 1.000–1.030)
Total Protein, Urine: NEGATIVE
URINE GLUCOSE: NEGATIVE
UROBILINOGEN UA: 1 (ref 0.0–1.0)
pH: 6.5 (ref 5.0–8.0)

## 2014-03-10 LAB — HEPATIC FUNCTION PANEL
ALT: 22 U/L (ref 0–53)
AST: 24 U/L (ref 0–37)
Albumin: 4.3 g/dL (ref 3.5–5.2)
Alkaline Phosphatase: 40 U/L (ref 39–117)
BILIRUBIN DIRECT: 0.1 mg/dL (ref 0.0–0.3)
BILIRUBIN TOTAL: 0.4 mg/dL (ref 0.2–1.2)
Total Protein: 7.2 g/dL (ref 6.0–8.3)

## 2014-03-10 LAB — HEMOGLOBIN A1C: Hgb A1c MFr Bld: 7.2 % — ABNORMAL HIGH (ref 4.6–6.5)

## 2014-03-10 LAB — PSA: PSA: 1.67 ng/mL (ref 0.10–4.00)

## 2014-03-10 LAB — TSH: TSH: 1.65 u[IU]/mL (ref 0.35–4.50)

## 2014-03-13 ENCOUNTER — Other Ambulatory Visit: Payer: Self-pay | Admitting: Internal Medicine

## 2014-03-14 ENCOUNTER — Encounter: Payer: BC Managed Care – PPO | Admitting: Internal Medicine

## 2014-03-18 ENCOUNTER — Encounter: Payer: Self-pay | Admitting: Internal Medicine

## 2014-03-18 ENCOUNTER — Ambulatory Visit (INDEPENDENT_AMBULATORY_CARE_PROVIDER_SITE_OTHER): Payer: BC Managed Care – PPO | Admitting: Internal Medicine

## 2014-03-18 VITALS — BP 112/62 | HR 64 | Temp 98.3°F | Ht 69.0 in | Wt 225.1 lb

## 2014-03-18 DIAGNOSIS — Z0000 Encounter for general adult medical examination without abnormal findings: Secondary | ICD-10-CM

## 2014-03-18 DIAGNOSIS — R972 Elevated prostate specific antigen [PSA]: Secondary | ICD-10-CM

## 2014-03-18 MED ORDER — LORAZEPAM 0.5 MG PO TABS: 0.5000 mg | ORAL_TABLET | Freq: Every day | ORAL | Status: DC | PRN

## 2014-03-18 MED ORDER — ESCITALOPRAM OXALATE 10 MG PO TABS: 10.0000 mg | ORAL_TABLET | Freq: Every day | ORAL | Status: DC

## 2014-03-18 MED ORDER — FENOFIBRATE 160 MG PO TABS: 160.0000 mg | ORAL_TABLET | Freq: Every day | ORAL | Status: DC

## 2014-03-18 MED ORDER — ASPIRIN EC 81 MG PO TBEC: 81.0000 mg | DELAYED_RELEASE_TABLET | Freq: Every day | ORAL | Status: DC

## 2014-03-18 MED ORDER — ROSUVASTATIN CALCIUM 40 MG PO TABS: 40.0000 mg | ORAL_TABLET | Freq: Every day | ORAL | Status: DC

## 2014-03-18 MED ORDER — FOLIC ACID 1 MG PO TABS
1.0000 mg | ORAL_TABLET | Freq: Every day | ORAL | Status: DC
Start: 2014-03-18 — End: 2020-01-26

## 2014-03-18 MED ORDER — METOPROLOL TARTRATE 25 MG PO TABS: 25.0000 mg | ORAL_TABLET | Freq: Every day | ORAL | Status: DC

## 2014-03-18 MED ORDER — FEXOFENADINE HCL 180 MG PO TABS: 180.0000 mg | ORAL_TABLET | Freq: Every day | ORAL | Status: DC | PRN

## 2014-03-18 NOTE — Assessment & Plan Note (Signed)

## 2014-03-18 NOTE — Progress Notes (Signed)
Subjective:    Patient ID: Scott Valentine, male    DOB: Aug 23, 1956, 58 y.o.   MRN: [id number]  HPI  Here for wellness and f/u;  Overall doing ok;  Pt denies CP, worsening SOB, DOE, wheezing, orthopnea, PND, worsening LE edema, palpitations, dizziness or syncope.  Pt denies neurological change such as new headache, facial or extremity weakness.  Pt denies polydipsia, polyuria, or low sugar symptoms. Pt states overall good compliance with treatment and medications, good tolerability, and has been trying to follow lower cholesterol diet.  Pt denies worsening depressive symptoms, suicidal ideation or panic. No fever, night sweats, loss of appetite, or other constitutional symptoms, except lost 10 lbs over last yr intentionally.  Pt states good ability with ADL's, has low fall risk, home safety reviewed and adequate, no other significant changes in hearing or vision, and only occasionally active with exercise. No current complaints. Does c/o ongoing fatigue, but denies signficant daytime hypersomnolence.  Past Medical History  Diagnosis Date  . GLUCOSE INTOLERANCE 10/19/2008  . HYPERLIPIDEMIA 10/08/2007  . Other and unspecified hyperlipidemia 05/01/2007  . Dysmetabolic syndrome X 2/50/5397  . Anxiety state, unspecified 10/08/2007  . Unspecified essential hypertension 05/01/2007  . URI 10/29/2007  . GERD 05/23/2008  . HYPERPLASIA PROSTATE UNS W/O UR OBST & OTH LUTS 10/08/2007  . Cough 11/08/2009  . UNS ADVRS EFF UNS RX MEDICINAL&BIOLOGICAL SBSTNC 12/17/2007  . 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 TOBACCO USE, QUIT 10/08/2007  . Impaired glucose tolerance 12/30/2010  . Rectal bleeding    Past Surgical History  Procedure Laterality Date  . Finger surgury'  05/30/2008  . Hemorrhoid surgery  04/25/11  . Anal fissure repair  04/25/11    reports that he has been smoking.  He does not have any smokeless tobacco history on file. He reports that he does not drink alcohol or use illicit drugs. family  history includes Coronary artery disease (age of onset: 101) in his father; Diabetes in his mother; Heart attack in his paternal grandfather; Heart disease in his brother and father; Lung cancer in his father; Mental illness in his mother; Prostate cancer in his paternal grandfather. Allergies  Allergen Reactions  . Atorvastatin     REACTION: myalgias  . Ezetimibe     REACTION: myalgias  . Sulfonamide Derivatives     REACTION: stevens johnson   Current Outpatient Prescriptions on File Prior to Visit  Medication Sig Dispense Refill  . fish oil-omega-3 fatty acids 1000 MG capsule Take 2 g by mouth daily.        Marland Kitchen LORazepam (ATIVAN) 0.5 MG tablet Take 1 tablet (0.5 mg total) by mouth daily as needed.  90 tablet  1  . Multiple Vitamin (MULTIVITAMIN) tablet Take 1 tablet by mouth daily.         No current facility-administered medications on file prior to visit.   Review of Systems Constitutional: Negative for increased diaphoresis, other activity, appetite or other siginficant weight change  HENT: Negative for worsening hearing loss, ear pain, facial swelling, mouth sores and neck stiffness.   Eyes: Negative for other worsening pain, redness or visual disturbance.  Respiratory: Negative for shortness of breath and wheezing.   Cardiovascular: Negative for chest pain and palpitations.  Gastrointestinal: Negative for diarrhea, blood in stool, abdominal distention or other pain Genitourinary: Negative for hematuria, flank pain or change in urine volume.  Musculoskeletal: Negative for myalgias or other joint complaints.  Skin: Negative for color change and wound.  Neurological: Negative for  syncope and numbness. other than noted Hematological: Negative for adenopathy. or other swelling Psychiatric/Behavioral: Negative for hallucinations, self-injury, decreased concentration or other worsening agitation.      Objective:   Physical Exam BP 112/62  Pulse 64  Temp(Src) 98.3 F (36.8 C) (Oral)   Ht 5\' 9"  (1.753 m)  Wt 225 lb 2 oz (102.116 kg)  BMI 33.23 kg/m2  SpO2 98% VS noted,  Constitutional: Pt is oriented to person, place, and time. Appears well-developed and well-nourished.  Head: Normocephalic and atraumatic.  Right Ear: External ear normal.  Left Ear: External ear normal.  Nose: Nose normal.  Mouth/Throat: Oropharynx is clear and moist.  Eyes: Conjunctivae and EOM are normal. Pupils are equal, round, and reactive to light.  Neck: Normal range of motion. Neck supple. No JVD present. No tracheal deviation present.  Cardiovascular: Normal rate, regular rhythm, normal heart sounds and intact distal pulses.   Pulmonary/Chest: Effort normal and breath sounds without rales or wheezing  Abdominal: Soft. Bowel sounds are normal. NT. No HSM  Musculoskeletal: Normal range of motion. Exhibits no edema.  Lymphadenopathy:  Has no cervical adenopathy.  Neurological: Pt is alert and oriented to person, place, and time. Pt has normal reflexes. No cranial nerve deficit. Motor grossly intact Skin: Skin is warm and dry. No rash noted.  Psychiatric:  Has normal mood and affect. Behavior is normal.     Assessment & Plan:

## 2014-03-18 NOTE — Progress Notes (Signed)
Pre visit review using our clinic review tool, if applicable. No additional management support is needed unless otherwise documented below in the visit note. 

## 2014-03-18 NOTE — Assessment & Plan Note (Signed)
Pt declines urology referral or antibx trial, so for f/u psa in 6 mo

## 2014-03-18 NOTE — Patient Instructions (Addendum)
OK to change the aspirin 325 mg to the 81 mg per day  Please continue all other medications as before, and refills have been done if requested.  Please have the pharmacy call with any other refills you may need.  Please continue your efforts at being more active, low cholesterol diet, and weight control.  You are otherwise up to date with prevention measures today.  Please keep your appointments with your specialists as you may have planned  Please remember to return for repeat PSA in 6 months   Your EKG was OK today  Please return in 1 year for your yearly visit, or sooner if needed, with Lab testing done 3-5 days before   .

## 2014-08-31 ENCOUNTER — Encounter: Payer: Self-pay | Admitting: Internal Medicine

## 2014-08-31 ENCOUNTER — Other Ambulatory Visit (INDEPENDENT_AMBULATORY_CARE_PROVIDER_SITE_OTHER): Payer: BC Managed Care – PPO

## 2014-08-31 DIAGNOSIS — R972 Elevated prostate specific antigen [PSA]: Secondary | ICD-10-CM

## 2014-08-31 LAB — PSA: PSA: 1.18 ng/mL (ref 0.10–4.00)

## 2014-11-20 ENCOUNTER — Other Ambulatory Visit: Payer: Self-pay | Admitting: Internal Medicine

## 2014-11-22 ENCOUNTER — Other Ambulatory Visit: Payer: Self-pay | Admitting: Internal Medicine

## 2014-11-22 NOTE — Telephone Encounter (Signed)
Done hardcopy to Cherina  

## 2014-12-14 ENCOUNTER — Encounter: Payer: Self-pay | Admitting: Internal Medicine

## 2015-01-12 ENCOUNTER — Encounter: Payer: Self-pay | Admitting: Internal Medicine

## 2015-03-10 ENCOUNTER — Ambulatory Visit (AMBULATORY_SURGERY_CENTER): Payer: Self-pay | Admitting: *Deleted

## 2015-03-10 VITALS — Ht 69.0 in | Wt 223.0 lb

## 2015-03-10 DIAGNOSIS — Z86010 Personal history of colonic polyps: Secondary | ICD-10-CM

## 2015-03-10 MED ORDER — NA SULFATE-K SULFATE-MG SULF 17.5-3.13-1.6 GM/177ML PO SOLN: ORAL | Status: DC

## 2015-03-10 NOTE — Progress Notes (Signed)
No allergies to eggs or soy. No problems with anesthesia.  Pt given Emmi instructions for colonoscopy  No oxygen use  No diet drug use  

## 2015-03-14 ENCOUNTER — Encounter: Payer: Self-pay | Admitting: Internal Medicine

## 2015-03-23 ENCOUNTER — Other Ambulatory Visit: Payer: Self-pay | Admitting: Internal Medicine

## 2015-03-24 ENCOUNTER — Other Ambulatory Visit (INDEPENDENT_AMBULATORY_CARE_PROVIDER_SITE_OTHER): Payer: BLUE CROSS/BLUE SHIELD

## 2015-03-24 DIAGNOSIS — R7989 Other specified abnormal findings of blood chemistry: Secondary | ICD-10-CM

## 2015-03-24 DIAGNOSIS — Z0000 Encounter for general adult medical examination without abnormal findings: Secondary | ICD-10-CM | POA: Diagnosis not present

## 2015-03-24 LAB — LIPID PANEL
CHOLESTEROL: 139 mg/dL (ref 0–200)
HDL: 31.2 mg/dL — AB (ref >39.00)
NonHDL: 107.8
TRIGLYCERIDES: 267 mg/dL — AB (ref 0.0–149.0)
Total CHOL/HDL Ratio: 4
VLDL: 53.4 mg/dL — ABNORMAL HIGH (ref 0.0–40.0)

## 2015-03-24 LAB — CBC WITH DIFFERENTIAL/PLATELET
BASOS ABS: 0 10*3/uL (ref 0.0–0.1)
BASOS PCT: 0.5 % (ref 0.0–3.0)
EOS ABS: 0.2 10*3/uL (ref 0.0–0.7)
Eosinophils Relative: 2.8 % (ref 0.0–5.0)
HCT: 43 % (ref 39.0–52.0)
Hemoglobin: 15 g/dL (ref 13.0–17.0)
LYMPHS ABS: 1.8 10*3/uL (ref 0.7–4.0)
Lymphocytes Relative: 20.5 % (ref 12.0–46.0)
MCHC: 34.9 g/dL (ref 30.0–36.0)
MCV: 87.7 fl (ref 78.0–100.0)
Monocytes Absolute: 0.6 10*3/uL (ref 0.1–1.0)
Monocytes Relative: 6.4 % (ref 3.0–12.0)
NEUTROS PCT: 69.8 % (ref 43.0–77.0)
Neutro Abs: 6 10*3/uL (ref 1.4–7.7)
PLATELETS: 218 10*3/uL (ref 150.0–400.0)
RBC: 4.9 Mil/uL (ref 4.22–5.81)
RDW: 13.1 % (ref 11.5–15.5)
WBC: 8.6 10*3/uL (ref 4.0–10.5)

## 2015-03-24 LAB — HEPATIC FUNCTION PANEL
ALK PHOS: 50 U/L (ref 39–117)
ALT: 20 U/L (ref 0–53)
AST: 17 U/L (ref 0–37)
Albumin: 4.4 g/dL (ref 3.5–5.2)
Bilirubin, Direct: 0.1 mg/dL (ref 0.0–0.3)
Total Bilirubin: 0.6 mg/dL (ref 0.2–1.2)
Total Protein: 7 g/dL (ref 6.0–8.3)

## 2015-03-24 LAB — URINALYSIS, ROUTINE W REFLEX MICROSCOPIC
Bilirubin Urine: NEGATIVE
Hgb urine dipstick: NEGATIVE
Ketones, ur: NEGATIVE
Leukocytes, UA: NEGATIVE
Nitrite: NEGATIVE
PH: 5.5 (ref 5.0–8.0)
RBC / HPF: NONE SEEN (ref 0–?)
Specific Gravity, Urine: >=1.03 — AB (ref 1.000–1.030)
Total Protein, Urine: NEGATIVE
Urine Glucose: 500 — AB
Urobilinogen, UA: 1 (ref 0.0–1.0)

## 2015-03-24 LAB — BASIC METABOLIC PANEL
BUN: 14 mg/dL (ref 6–23)
CALCIUM: 9.5 mg/dL (ref 8.4–10.5)
CO2: 25 mEq/L (ref 19–32)
CREATININE: 1.06 mg/dL (ref 0.40–1.50)
Chloride: 102 mEq/L (ref 96–112)
GFR: 76.47 mL/min (ref >60.00)
Glucose, Bld: 210 mg/dL — ABNORMAL HIGH (ref 70–99)
POTASSIUM: 3.9 meq/L (ref 3.5–5.1)
SODIUM: 137 meq/L (ref 135–145)

## 2015-03-24 LAB — PSA: PSA: 0.99 ng/mL (ref 0.10–4.00)

## 2015-03-24 LAB — LDL CHOLESTEROL, DIRECT: LDL DIRECT: 71 mg/dL

## 2015-03-24 LAB — TSH: TSH: 2.03 u[IU]/mL (ref 0.35–4.50)

## 2015-03-27 ENCOUNTER — Encounter: Payer: Self-pay | Admitting: Internal Medicine

## 2015-03-27 ENCOUNTER — Ambulatory Visit (AMBULATORY_SURGERY_CENTER): Payer: BLUE CROSS/BLUE SHIELD | Admitting: Internal Medicine

## 2015-03-27 VITALS — BP 126/77 | HR 50 | Temp 96.4°F | Resp 18 | Ht 69.0 in | Wt 223.0 lb

## 2015-03-27 DIAGNOSIS — Z86010 Personal history of colonic polyps: Secondary | ICD-10-CM

## 2015-03-27 DIAGNOSIS — D125 Benign neoplasm of sigmoid colon: Secondary | ICD-10-CM | POA: Diagnosis not present

## 2015-03-27 DIAGNOSIS — K635 Polyp of colon: Secondary | ICD-10-CM

## 2015-03-27 DIAGNOSIS — D124 Benign neoplasm of descending colon: Secondary | ICD-10-CM | POA: Diagnosis not present

## 2015-03-27 MED ORDER — SODIUM CHLORIDE 0.9 % IV SOLN: 500.0000 mL | INTRAVENOUS | Status: DC

## 2015-03-27 NOTE — Progress Notes (Signed)
A/ox3, pleased with MAC, report to RN 

## 2015-03-27 NOTE — Progress Notes (Signed)
Called to room to assist during endoscopic procedure.  Patient ID and intended procedure confirmed with present staff. Received instructions for my participation in the procedure from the performing physician.  

## 2015-03-27 NOTE — Op Note (Signed)
[place]  [address] [place], [id number]   COLONOSCOPY PROCEDURE REPORT  PATIENT: Scott Valentine, Scott Valentine  MR#: [id number] BIRTHDATE: 1956-08-11 , 56  yrs. old GENDER: male ENDOSCOPIST: Eustace Quail, MD REFERRED [id number] Program Recall PROCEDURE DATE:  03/27/2015 PROCEDURE:   Colonoscopy, surveillance and Colonoscopy with snare polypectomy x 2 First Screening Colonoscopy - Avg.  risk and is 50 yrs.  old or older - No.  Prior Negative Screening - Now for repeat screening. N/A  History of Adenoma - Now for follow-up colonoscopy & has been > or = to 3 yrs.  Yes hx of adenoma.  Has been 3 or more years since last colonoscopy.  Polyps removed today? Yes ASA CLASS:   Class II INDICATIONS:Surveillance due to prior colonic neoplasia and PH Colon Adenoma.  Prior exams 2005 (multiple HP); 2011 (HP and TA) MEDICATIONS: Monitored anesthesia care and Propofol 320 mg IV  DESCRIPTION OF PROCEDURE:   After the risks benefits and alternatives of the procedure were thoroughly explained, informed consent was obtained.  The digital rectal exam revealed no abnormalities of the rectum.   The LB [id number]  endoscope was introduced through the anus and advanced to the cecum, which was identified by both the appendix and ileocecal valve. No adverse events experienced.   The quality of the prep was good.  (Suprep was used)  The instrument was then slowly withdrawn as the colon was fully examined. Estimated blood loss is zero unless otherwise noted in this procedure report.  COLON FINDINGS: Two polyps measuring 5 mm in size were found in the descending colon and sigmoid colon.  A polypectomy was performed with a cold snare.  The resection was complete, the polyp tissue was completely retrieved and sent to histology.   There was moderate diverticulosis noted in the left colon.   The examination was otherwise normal.  Retroflexed views revealed internal hemorrhoids. The  time to cecum = 3.4 Withdrawal time = 17.1   The scope was withdrawn and the procedure completed. COMPLICATIONS: There were no immediate complications.  ENDOSCOPIC IMPRESSION: 1.   Two polyps were found in the descending colon and sigmoid colon; polypectomy was performed with a cold snare 2.   Moderate diverticulosis was noted in the left colon 3.   The examination was otherwise normal  RECOMMENDATIONS: 1. Follow up colonoscopy in 5 years  eSigned:  Eustace Quail, MD 03/27/2015 8:41 AM   cc: The Patient and Biagio Borg, MD

## 2015-03-27 NOTE — Patient Instructions (Signed)
YOU HAD AN ENDOSCOPIC PROCEDURE TODAY AT [facility]:   Refer to the procedure report that was given to you for any specific questions about what was found during the examination.  If the procedure report does not answer your questions, please call your gastroenterologist to clarify.  If you requested that your care partner not be given the details of your procedure findings, then the procedure report has been included in a sealed envelope for you to review at your convenience later.  YOU SHOULD EXPECT: Some feelings of bloating in the abdomen. Passage of more gas than usual.  Walking can help get rid of the air that was put into your GI tract during the procedure and reduce the bloating. If you had a lower endoscopy (such as a colonoscopy or flexible sigmoidoscopy) you may notice spotting of blood in your stool or on the toilet paper. If you underwent a bowel prep for your procedure, you may not have a normal bowel movement for a few days.  Please Note:  You might notice some irritation and congestion in your nose or some drainage.  This is from the oxygen used during your procedure.  There is no need for concern and it should clear up in a day or so.  SYMPTOMS TO REPORT IMMEDIATELY:   Following lower endoscopy (colonoscopy or flexible sigmoidoscopy):  Excessive amounts of blood in the stool  Significant tenderness or worsening of abdominal pains  Swelling of the abdomen that is new, acute  Fever of 100F or higher   For urgent or emergent issues, a gastroenterologist can be reached at any hour by calling [phone].   DIET: Your first meal following the procedure should be a small meal and then it is ok to progress to your normal diet. Heavy or fried foods are harder to digest and may make you feel nauseous or bloated.  Likewise, meals heavy in dairy and vegetables can increase bloating.  Drink plenty of fluids but you should avoid alcoholic beverages for 24  hours.  ACTIVITY:  You should plan to take it easy for the rest of today and you should NOT DRIVE or use heavy machinery until tomorrow (because of the sedation medicines used during the test).    FOLLOW UP: Our staff will call the number listed on your records the next business day following your procedure to check on you and address any questions or concerns that you may have regarding the information given to you following your procedure. If we do not reach you, we will leave a message.  However, if you are feeling well and you are not experiencing any problems, there is no need to return our call.  We will assume that you have returned to your regular daily activities without incident.  If any biopsies were taken you will be contacted by phone or by letter within the next 1-3 weeks.  Please call us at [phone] if you have not heard about the biopsies in 3 weeks.    SIGNATURES/CONFIDENTIALITY: You and/or your care partner have signed paperwork which will be entered into your electronic medical record.  These signatures attest to the fact that that the information above on your After Visit Summary has been reviewed and is understood.  Full responsibility of the confidentiality of this discharge information lies with you and/or your care-partner.    Handouts were given to your care partner on polyps, diverticulosis, and a high fiber diet with liberal fluid intake. You may resume  current medications today. Await biopsy results. Please call if any questions or concerns.   

## 2015-03-27 NOTE — Progress Notes (Signed)
No problems noted in the recovery room. maw 

## 2015-03-28 ENCOUNTER — Other Ambulatory Visit (INDEPENDENT_AMBULATORY_CARE_PROVIDER_SITE_OTHER): Payer: BLUE CROSS/BLUE SHIELD

## 2015-03-28 ENCOUNTER — Telehealth: Payer: Self-pay | Admitting: *Deleted

## 2015-03-28 DIAGNOSIS — R739 Hyperglycemia, unspecified: Secondary | ICD-10-CM | POA: Diagnosis not present

## 2015-03-28 LAB — HEMOGLOBIN A1C: Hgb A1c MFr Bld: 9.5 % — ABNORMAL HIGH (ref 4.6–6.5)

## 2015-03-28 NOTE — Telephone Encounter (Signed)
  Follow up Call-  Call back number 03/27/2015  Post procedure Call Back phone  # [phone]  Permission to leave phone message Yes     Patient questions:  Do you have a fever, pain , or abdominal swelling? No. Pain Score  0 *  Have you tolerated food without any problems? Yes.    Have you been able to return to your normal activities? Yes.    Do you have any questions about your discharge instructions: Diet   No. Medications  No. Follow up visit  No.  Do you have questions or concerns about your Care? Yes.    Actions: * If pain score is 4 or above: No action needed, pain <4.

## 2015-03-30 ENCOUNTER — Other Ambulatory Visit: Payer: Self-pay | Admitting: Internal Medicine

## 2015-03-30 ENCOUNTER — Encounter: Payer: Self-pay | Admitting: Internal Medicine

## 2015-03-30 MED ORDER — METFORMIN HCL ER 500 MG PO TB24: 1000.0000 mg | ORAL_TABLET | Freq: Every day | ORAL | Status: DC

## 2015-03-31 ENCOUNTER — Encounter: Payer: Self-pay | Admitting: Internal Medicine

## 2015-03-31 ENCOUNTER — Ambulatory Visit (INDEPENDENT_AMBULATORY_CARE_PROVIDER_SITE_OTHER): Payer: BLUE CROSS/BLUE SHIELD | Admitting: Internal Medicine

## 2015-03-31 VITALS — BP 128/84 | HR 60 | Temp 98.4°F | Ht 69.0 in | Wt 223.0 lb

## 2015-03-31 DIAGNOSIS — Z0000 Encounter for general adult medical examination without abnormal findings: Secondary | ICD-10-CM

## 2015-03-31 DIAGNOSIS — E119 Type 2 diabetes mellitus without complications: Secondary | ICD-10-CM

## 2015-03-31 NOTE — Patient Instructions (Signed)
Please take all new medication as prescribed - the metformin  Please continue all other medications as before, and refills have been done if requested.  Please have the pharmacy call with any other refills you may need.  Please continue your efforts at being more active, low cholesterol diet, and weight control.  You are otherwise up to date with prevention measures today.  Please keep your appointments with your specialists as you may have planned  Please return in 6 months, or sooner if needed, with Lab testing done 3-5 days before

## 2015-03-31 NOTE — Assessment & Plan Note (Signed)
Uncontrolled, to start metformin ER 500 - 2 qd, cont better diet /excercise, declines nutirtion for now, cont podiatry f/u,  Lab Results  Component Value Date   HGBA1C 9.5* 03/28/2015   F/u with labs next visit

## 2015-03-31 NOTE — Progress Notes (Signed)
Pre visit review using our clinic review tool, if applicable. No additional management support is needed unless otherwise documented below in the visit note. 

## 2015-03-31 NOTE — Progress Notes (Signed)
Subjective:    Patient ID: Scott Valentine, male    DOB: 1956/07/27, 59 y.o.   MRN: [id number]  HPI  Here for wellness and f/u;  Overall doing ok;  Pt denies Chest pain, worsening SOB, DOE, wheezing, orthopnea, PND, worsening LE edema, palpitations, dizziness or syncope.  Pt denies neurological change such as new headache, facial or extremity weakness.  Pt denies polydipsia, polyuria, or low sugar symptoms. Pt states overall good compliance with treatment and medications, good tolerability, and has been trying to follow appropriate diet.  Pt denies worsening depressive symptoms, suicidal ideation or panic. No fever, night sweats, wt loss, loss of appetite, or other constitutional symptoms.  Pt states good ability with ADL's, has low fall risk, home safety reviewed and adequate, no other significant changes in hearing or vision, and only occasionally active with exercise.  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  Sees podiatry.  Wt Readings from Last 3 Encounters:  03/31/15 223 lb (101.152 kg)  03/27/15 223 lb (101.152 kg)  03/10/15 223 lb (101.152 kg)    Past Medical History  Diagnosis Date  . GLUCOSE INTOLERANCE 10/19/2008  . HYPERLIPIDEMIA 10/08/2007  . Other and unspecified hyperlipidemia 05/01/2007  . Dysmetabolic syndrome X 9/35/7017  . Anxiety state, unspecified 10/08/2007  . Unspecified essential hypertension 05/01/2007  . URI 10/29/2007  . GERD 05/23/2008  . HYPERPLASIA PROSTATE UNS W/O UR OBST & OTH LUTS 10/08/2007  . Cough 11/08/2009  . UNS ADVRS EFF UNS RX MEDICINAL&BIOLOGICAL SBSTNC 12/17/2007  . 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 TOBACCO USE, QUIT 10/08/2007  . Impaired glucose tolerance 12/30/2010  . Rectal bleeding   . Allergy    Past Surgical History  Procedure Laterality Date  . Finger surgury' Left 05/30/2008    3rd finger  . Hemorrhoid surgery  04/25/11  . Anal fissure repair  04/25/11    reports that he has been  smoking.  He has never used smokeless tobacco. He reports that he does not drink alcohol or use illicit drugs. family history includes Coronary artery disease (age of onset: 65) in his father; Diabetes in his mother; Heart attack in his paternal grandfather; Heart disease in his brother and father; Lung cancer in his father; Mental illness in his mother; Prostate cancer in his paternal grandfather. There is no history of Colon cancer. Allergies  Allergen Reactions  . Atorvastatin     REACTION: myalgias  . Ezetimibe     REACTION: myalgias  . Sulfonamide Derivatives     REACTION: stevens johnson   Current Outpatient Prescriptions on File Prior to Visit  Medication Sig Dispense Refill  . aspirin EC 81 MG tablet Take 1 tablet (81 mg total) by mouth daily. 90 tablet 11  . escitalopram (LEXAPRO) 10 MG tablet TAKE ONE TABLET BY MOUTH ONE TIME DAILY 30 tablet 0  . fenofibrate 160 MG tablet Take 1 tablet (160 mg total) by mouth daily. 90 tablet 3  . fexofenadine (ALLEGRA) 180 MG tablet Take 1 tablet (180 mg total) by mouth daily as needed. 90 tablet 3  . fish oil-omega-3 fatty acids 1000 MG capsule Take 2 g by mouth daily.      . folic acid (FOLVITE) 1 MG tablet Take 1 tablet (1 mg total) by mouth daily. 90 tablet 3  . LORazepam (ATIVAN) 0.5 MG tablet TAKE ONE TABLET BY MOUTH DAILY AS NEEDED 90 tablet 1  . metFORMIN (GLUCOPHAGE-XR)  500 MG 24 hr tablet Take 2 tablets (1,000 mg total) by mouth daily with breakfast. 180 tablet 3  . metoprolol tartrate (LOPRESSOR) 25 MG tablet Take 1 tablet (25 mg total) by mouth daily. 90 tablet 3  . Multiple Vitamin (MULTIVITAMIN) tablet Take 1 tablet by mouth daily.      . rosuvastatin (CRESTOR) 40 MG tablet Take 1 tablet (40 mg total) by mouth daily. 90 tablet 3   No current facility-administered medications on file prior to visit.   Review of Systems Constitutional: Negative for increased diaphoresis, other activity, appetite or siginficant weight change other  than noted HENT: Negative for worsening hearing loss, ear pain, facial swelling, mouth sores and neck stiffness.   Eyes: Negative for other worsening pain, redness or visual disturbance.  Respiratory: Negative for shortness of breath and wheezing  Cardiovascular: Negative for chest pain and palpitations.  Gastrointestinal: Negative for diarrhea, blood in stool, abdominal distention or other pain Genitourinary: Negative for hematuria, flank pain or change in urine volume.  Musculoskeletal: Negative for myalgias or other joint complaints.  Skin: Negative for color change and wound or drainage.  Neurological: Negative for syncope and numbness. other than noted Hematological: Negative for adenopathy. or other swelling Psychiatric/Behavioral: Negative for hallucinations, SI, self-injury, decreased concentration or other worsening agitation.      Objective:   Physical Exam BP 128/84 mmHg  Pulse 60  Temp(Src) 98.4 F (36.9 C) (Oral)  Ht 5\' 9"  (1.753 m)  Wt 223 lb (101.152 kg)  BMI 32.92 kg/m2  SpO2 98% VS noted,  Constitutional: Pt is oriented to person, place, and time. Appears well-developed and well-nourished, in no significant distress Head: Normocephalic and atraumatic.  Right Ear: External ear normal.  Left Ear: External ear normal.  Nose: Nose normal.  Mouth/Throat: Oropharynx is clear and moist.  Eyes: Conjunctivae and EOM are normal. Pupils are equal, round, and reactive to light.  Neck: Normal range of motion. Neck supple. No JVD present. No tracheal deviation present or significant neck LA or mass Cardiovascular: Normal rate, regular rhythm, normal heart sounds and intact distal pulses.   Pulmonary/Chest: Effort normal and breath sounds without rales or wheezing  Abdominal: Soft. Bowel sounds are normal. NT. No HSM  Musculoskeletal: Normal range of motion. Exhibits no edema.  Lymphadenopathy:  Has no cervical adenopathy.  Neurological: Pt is alert and oriented to person,  place, and time. Pt has normal reflexes. No cranial nerve deficit. Motor grossly intact Skin: Skin is warm and dry. No rash noted.  Psychiatric:  Has normal mood and affect. Behavior is normal.         Assessment & Plan:

## 2015-03-31 NOTE — Progress Notes (Signed)
Pt advised at 8:30 OV 03/31/2015

## 2015-03-31 NOTE — Assessment & Plan Note (Signed)

## 2015-04-17 ENCOUNTER — Encounter: Payer: Self-pay | Admitting: Internal Medicine

## 2015-04-26 ENCOUNTER — Other Ambulatory Visit: Payer: Self-pay | Admitting: Internal Medicine

## 2015-05-08 ENCOUNTER — Other Ambulatory Visit: Payer: Self-pay | Admitting: Internal Medicine

## 2015-06-05 ENCOUNTER — Other Ambulatory Visit: Payer: Self-pay | Admitting: Internal Medicine

## 2015-09-29 ENCOUNTER — Other Ambulatory Visit (INDEPENDENT_AMBULATORY_CARE_PROVIDER_SITE_OTHER): Payer: BLUE CROSS/BLUE SHIELD

## 2015-09-29 DIAGNOSIS — E119 Type 2 diabetes mellitus without complications: Secondary | ICD-10-CM | POA: Diagnosis not present

## 2015-09-29 LAB — LIPID PANEL
CHOL/HDL RATIO: 4
CHOLESTEROL: 151 mg/dL (ref 0–200)
HDL: 35.7 mg/dL — AB (ref >39.00)
LDL Cholesterol: 86 mg/dL (ref 0–99)
NonHDL: 114.9
TRIGLYCERIDES: 146 mg/dL (ref 0.0–149.0)
VLDL: 29.2 mg/dL (ref 0.0–40.0)

## 2015-09-29 LAB — BASIC METABOLIC PANEL
BUN: 10 mg/dL (ref 6–23)
CHLORIDE: 102 meq/L (ref 96–112)
CO2: 27 meq/L (ref 19–32)
CREATININE: 1.04 mg/dL (ref 0.40–1.50)
Calcium: 9.4 mg/dL (ref 8.4–10.5)
GFR: 78.02 mL/min (ref >60.00)
GLUCOSE: 164 mg/dL — AB (ref 70–99)
Potassium: 4.3 mEq/L (ref 3.5–5.1)
Sodium: 137 mEq/L (ref 135–145)

## 2015-09-29 LAB — HEPATIC FUNCTION PANEL
ALBUMIN: 4.5 g/dL (ref 3.5–5.2)
ALT: 23 U/L (ref 0–53)
AST: 23 U/L (ref 0–37)
Alkaline Phosphatase: 45 U/L (ref 39–117)
BILIRUBIN TOTAL: 0.6 mg/dL (ref 0.2–1.2)
Bilirubin, Direct: 0.1 mg/dL (ref 0.0–0.3)
TOTAL PROTEIN: 7.7 g/dL (ref 6.0–8.3)

## 2015-09-29 LAB — HEMOGLOBIN A1C: Hgb A1c MFr Bld: 9 % — ABNORMAL HIGH (ref 4.6–6.5)

## 2015-09-30 ENCOUNTER — Encounter: Payer: Self-pay | Admitting: Internal Medicine

## 2015-09-30 ENCOUNTER — Other Ambulatory Visit: Payer: Self-pay | Admitting: Internal Medicine

## 2015-09-30 MED ORDER — GLIPIZIDE ER 10 MG PO TB24: 10.0000 mg | ORAL_TABLET | Freq: Every day | ORAL | Status: DC

## 2015-10-06 ENCOUNTER — Encounter: Payer: Self-pay | Admitting: Internal Medicine

## 2015-10-06 ENCOUNTER — Ambulatory Visit (INDEPENDENT_AMBULATORY_CARE_PROVIDER_SITE_OTHER): Payer: BLUE CROSS/BLUE SHIELD | Admitting: Internal Medicine

## 2015-10-06 VITALS — BP 140/88 | HR 60 | Temp 97.9°F | Resp 20 | Ht 69.0 in | Wt 219.0 lb

## 2015-10-06 DIAGNOSIS — I10 Essential (primary) hypertension: Secondary | ICD-10-CM

## 2015-10-06 DIAGNOSIS — Z0189 Encounter for other specified special examinations: Secondary | ICD-10-CM

## 2015-10-06 DIAGNOSIS — E782 Mixed hyperlipidemia: Secondary | ICD-10-CM | POA: Diagnosis not present

## 2015-10-06 DIAGNOSIS — E119 Type 2 diabetes mellitus without complications: Secondary | ICD-10-CM

## 2015-10-06 DIAGNOSIS — Z0000 Encounter for general adult medical examination without abnormal findings: Secondary | ICD-10-CM

## 2015-10-06 MED ORDER — ESCITALOPRAM OXALATE 10 MG PO TABS: 10.0000 mg | ORAL_TABLET | Freq: Every day | ORAL | Status: DC

## 2015-10-06 MED ORDER — METFORMIN HCL ER 500 MG PO TB24: ORAL_TABLET | ORAL | Status: DC

## 2015-10-06 NOTE — Assessment & Plan Note (Signed)
stable overall by history and exam, recent data reviewed with pt, and pt to continue medical treatment as before,  to f/u any worsening symptoms or concerns Lab Results  Component Value Date   LDLCALC 86 09/29/2015

## 2015-10-06 NOTE — Progress Notes (Signed)
Pre visit review using our clinic review tool, if applicable. No additional management support is needed unless otherwise documented below in the visit note. 

## 2015-10-06 NOTE — Patient Instructions (Signed)
OK to increase the metformin to 4 pills in the am  Please continue all other medications as before, and refills have been done if requested.  Please have the pharmacy call with any other refills you may need.  Please continue your efforts at being more active, low cholesterol diet, and weight control.  Please keep your appointments with your specialists as you may have planned  Please return in 6 months, or sooner if needed, with Lab testing done 3-5 days before

## 2015-10-06 NOTE — Assessment & Plan Note (Signed)
stable overall by history and exam, recent data reviewed with pt, and pt to continue medical treatment as before,  to f/u any worsening symptoms or concerns BP Readings from Last 3 Encounters:  10/06/15 140/88  03/31/15 128/84  03/27/15 126/77

## 2015-10-06 NOTE — Assessment & Plan Note (Signed)
With increased a1c in the past yr with job change involving more driving and sitting time, now back to previous position with more activity, for now to increase the metformin to 4 per day in the am, cont all other meds, cont better diet and exercise, f/u labs next visit  Lab Results  Component Value Date   HGBA1C 9.0* 09/29/2015

## 2015-10-06 NOTE — Progress Notes (Signed)
Subjective:    Patient ID: Scott Valentine, male    DOB: [birth year], 58 y.o.   MRN: [id number]  HPI  Here to f/u; overall doing ok,  Pt denies chest pain, increasing sob or doe, wheezing, orthopnea, PND, increased LE swelling, palpitations, dizziness or syncope.  Pt denies new neurological symptoms such as new headache, or facial or extremity weakness or numbness.  Pt denies polydipsia, polyuria, or low sugar episode.   Pt denies new neurological symptoms such as new headache, or facial or extremity weakness or numbness.   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 Peak wt has been about 235 in the past.  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 Father died last mo, more stress lately trying to deal with estate.  Energy level and sex drive less recently.  Wants a med to fix this.  Past Medical History  Diagnosis Date  . GLUCOSE INTOLERANCE 10/19/2008  . HYPERLIPIDEMIA 10/08/2007  . Other and unspecified hyperlipidemia 05/01/2007  . Dysmetabolic syndrome X [id number]  . Anxiety state, unspecified 10/08/2007  . Unspecified essential hypertension 05/01/2007  . URI 10/29/2007  . GERD 05/23/2008  . HYPERPLASIA PROSTATE UNS W/O UR OBST & OTH LUTS 10/08/2007  . Cough 11/08/2009  . UNS ADVRS EFF UNS RX MEDICINAL&BIOLOGICAL SBSTNC 12/17/2007  . 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 TOBACCO USE, QUIT 10/08/2007  . Impaired glucose tolerance 12/30/2010  . Rectal bleeding   . Allergy    Past Surgical History  Procedure  Laterality Date  . Finger surgury' Left 05/30/2008    3rd finger  . Hemorrhoid surgery  04/25/11  . Anal fissure repair  04/25/11    reports that he has been smoking.  He has never used smokeless tobacco. He reports that he does not drink alcohol or use illicit drugs. family history includes Coronary artery disease (age of onset: 42) in his father; Diabetes in his mother; Heart attack in his paternal grandfather; Heart disease in his brother and father; Lung cancer in his father; Mental illness in his mother; Prostate cancer in his paternal grandfather. There is no history of Colon cancer. Allergies  Allergen Reactions  . Atorvastatin     REACTION: myalgias  . Ezetimibe     REACTION: myalgias  . Sulfonamide Derivatives     REACTION: stevens johnson   Current Outpatient Prescriptions on File Prior to Visit  Medication Sig Dispense Refill  . aspirin EC 81 MG tablet Take 1 tablet (81 mg total) by mouth daily. 90 tablet 11  . CRESTOR 40 MG tablet TAKE ONE TABLET BY MOUTH ONE TIME DAILY 90 tablet 1  . escitalopram (LEXAPRO) 10 MG tablet TAKE ONE TABLET BY MOUTH ONE TIME DAILY 30 tablet 5  . fenofibrate 160 MG tablet TAKE ONE TABLET BY MOUTH ONE TIME DAILY 90 tablet 2  . fexofenadine (ALLEGRA) 180 MG tablet Take 1 tablet (180 mg total) by mouth daily as needed. 90 tablet 3  . fish oil-omega-3 fatty  acids 1000 MG capsule Take 2 g by mouth daily.      . folic acid (FOLVITE) 1 MG tablet Take 1 tablet (1 mg total) by mouth daily. 90 tablet 3  . glipiZIDE (GLUCOTROL XL) 10 MG 24 hr tablet Take 1 tablet (10 mg total) by mouth daily with breakfast. 90 tablet 3  . LORazepam (ATIVAN) 0.5 MG tablet TAKE ONE TABLET BY MOUTH DAILY AS NEEDED 90 tablet 1  . metFORMIN (GLUCOPHAGE-XR) 500 MG 24 hr tablet Take 2 tablets (1,000 mg total) by mouth daily with breakfast. 180 tablet 3  . metoprolol tartrate (LOPRESSOR) 25 MG tablet TAKE ONE TABLET BY MOUTH ONE TIME DAILY 90 tablet 3  . Multiple Vitamin (MULTIVITAMIN)  tablet Take 1 tablet by mouth daily.       No current facility-administered medications on file prior to visit.   Review of Systems  Constitutional: Negative for unusual diaphoresis or night sweats HENT: Negative for ringing in ear or discharge Eyes: Negative for double vision or worsening visual disturbance.  Respiratory: Negative for choking and stridor.   Gastrointestinal: Negative for vomiting or other signifcant bowel change Genitourinary: Negative for hematuria or change in urine volume.  Musculoskeletal: Negative for other MSK pain or swelling Skin: Negative for color change and worsening wound.  Neurological: Negative for tremors and numbness other than noted  Psychiatric/Behavioral: Negative for decreased concentration or agitation other than above       Objective:   Physical Exam BP 140/88 mmHg  Pulse 60  Temp(Src) 97.9 F (36.6 C) (Oral)  Resp 20  Ht 5\' 9"  (1.753 m)  Wt 219 lb (99.338 kg)  BMI 32.33 kg/m2  SpO2 98% VS noted, not ill appearing  Constitutional: Pt appears in no significant distress HENT: Head: NCAT.  Right Ear: External ear normal.  Left Ear: External ear normal.  Eyes: . Pupils are equal, round, and reactive to light. Conjunctivae and EOM are normal Neck: Normal range of motion. Neck supple.  Cardiovascular: Normal rate and regular rhythm.   Pulmonary/Chest: Effort normal and breath sounds without rales or wheezing.  Neurological: Pt is alert. Not confused , motor grossly intact Skin: Skin is warm. No rash, no LE edema Psychiatric: Pt behavior is normal. No agitation.     Assessment & Plan:

## 2015-11-22 ENCOUNTER — Other Ambulatory Visit: Payer: Self-pay | Admitting: Internal Medicine

## 2016-01-08 LAB — HM DIABETES EYE EXAM

## 2016-02-20 ENCOUNTER — Other Ambulatory Visit: Payer: Self-pay | Admitting: Internal Medicine

## 2016-04-05 ENCOUNTER — Other Ambulatory Visit (INDEPENDENT_AMBULATORY_CARE_PROVIDER_SITE_OTHER): Payer: 59

## 2016-04-05 DIAGNOSIS — Z0189 Encounter for other specified special examinations: Secondary | ICD-10-CM

## 2016-04-05 DIAGNOSIS — Z0000 Encounter for general adult medical examination without abnormal findings: Secondary | ICD-10-CM

## 2016-04-05 DIAGNOSIS — E119 Type 2 diabetes mellitus without complications: Secondary | ICD-10-CM | POA: Diagnosis not present

## 2016-04-05 LAB — URINALYSIS, ROUTINE W REFLEX MICROSCOPIC
Bilirubin Urine: NEGATIVE
Hgb urine dipstick: NEGATIVE
KETONES UR: NEGATIVE
Leukocytes, UA: NEGATIVE
Nitrite: NEGATIVE
PH: 6 (ref 5.0–8.0)
SPECIFIC GRAVITY, URINE: 1.02 (ref 1.000–1.030)
Total Protein, Urine: NEGATIVE
URINE GLUCOSE: NEGATIVE
UROBILINOGEN UA: 1 (ref 0.0–1.0)

## 2016-04-05 LAB — TSH: TSH: 2.3 u[IU]/mL (ref 0.35–4.50)

## 2016-04-05 LAB — LIPID PANEL
CHOLESTEROL: 145 mg/dL (ref 0–200)
HDL: 34 mg/dL — ABNORMAL LOW (ref >39.00)
LDL Cholesterol: 84 mg/dL (ref 0–99)
NONHDL: 110.61
Total CHOL/HDL Ratio: 4
Triglycerides: 131 mg/dL (ref 0.0–149.0)
VLDL: 26.2 mg/dL (ref 0.0–40.0)

## 2016-04-05 LAB — MICROALBUMIN / CREATININE URINE RATIO
CREATININE, U: 113.8 mg/dL
Microalb Creat Ratio: 0.6 mg/g (ref 0.0–30.0)

## 2016-04-05 LAB — CBC WITH DIFFERENTIAL/PLATELET
BASOS ABS: 0 10*3/uL (ref 0.0–0.1)
Basophils Relative: 0.4 % (ref 0.0–3.0)
EOS ABS: 0.3 10*3/uL (ref 0.0–0.7)
Eosinophils Relative: 4 % (ref 0.0–5.0)
HCT: 41.4 % (ref 39.0–52.0)
Hemoglobin: 14.3 g/dL (ref 13.0–17.0)
LYMPHS ABS: 1.5 10*3/uL (ref 0.7–4.0)
Lymphocytes Relative: 22 % (ref 12.0–46.0)
MCHC: 34.4 g/dL (ref 30.0–36.0)
MCV: 88.1 fl (ref 78.0–100.0)
Monocytes Absolute: 0.4 10*3/uL (ref 0.1–1.0)
Monocytes Relative: 6.4 % (ref 3.0–12.0)
NEUTROS ABS: 4.6 10*3/uL (ref 1.4–7.7)
NEUTROS PCT: 67.2 % (ref 43.0–77.0)
PLATELETS: 223 10*3/uL (ref 150.0–400.0)
RBC: 4.7 Mil/uL (ref 4.22–5.81)
RDW: 12.7 % (ref 11.5–15.5)
WBC: 6.9 10*3/uL (ref 4.0–10.5)

## 2016-04-05 LAB — BASIC METABOLIC PANEL
BUN: 13 mg/dL (ref 6–23)
CHLORIDE: 105 meq/L (ref 96–112)
CO2: 26 meq/L (ref 19–32)
CREATININE: 0.98 mg/dL (ref 0.40–1.50)
Calcium: 9.6 mg/dL (ref 8.4–10.5)
GFR: 83.41 mL/min (ref >60.00)
Glucose, Bld: 156 mg/dL — ABNORMAL HIGH (ref 70–99)
Potassium: 4.8 mEq/L (ref 3.5–5.1)
SODIUM: 138 meq/L (ref 135–145)

## 2016-04-05 LAB — HEPATIC FUNCTION PANEL
ALK PHOS: 39 U/L (ref 39–117)
ALT: 17 U/L (ref 0–53)
AST: 17 U/L (ref 0–37)
Albumin: 4.5 g/dL (ref 3.5–5.2)
BILIRUBIN DIRECT: 0.1 mg/dL (ref 0.0–0.3)
TOTAL PROTEIN: 7.2 g/dL (ref 6.0–8.3)
Total Bilirubin: 0.5 mg/dL (ref 0.2–1.2)

## 2016-04-05 LAB — HEMOGLOBIN A1C: HEMOGLOBIN A1C: 8 % — AB (ref 4.6–6.5)

## 2016-04-05 LAB — PSA: PSA: 1.48 ng/mL (ref 0.10–4.00)

## 2016-04-12 ENCOUNTER — Encounter: Payer: Self-pay | Admitting: Internal Medicine

## 2016-04-12 ENCOUNTER — Ambulatory Visit (INDEPENDENT_AMBULATORY_CARE_PROVIDER_SITE_OTHER): Payer: 59 | Admitting: Internal Medicine

## 2016-04-12 VITALS — BP 138/86 | HR 58 | Temp 98.3°F | Resp 17 | Ht 69.0 in | Wt 214.5 lb

## 2016-04-12 DIAGNOSIS — I10 Essential (primary) hypertension: Secondary | ICD-10-CM | POA: Diagnosis not present

## 2016-04-12 DIAGNOSIS — Z23 Encounter for immunization: Secondary | ICD-10-CM

## 2016-04-12 DIAGNOSIS — Z0001 Encounter for general adult medical examination with abnormal findings: Secondary | ICD-10-CM

## 2016-04-12 DIAGNOSIS — E119 Type 2 diabetes mellitus without complications: Secondary | ICD-10-CM | POA: Diagnosis not present

## 2016-04-12 DIAGNOSIS — R6889 Other general symptoms and signs: Secondary | ICD-10-CM | POA: Diagnosis not present

## 2016-04-12 DIAGNOSIS — E782 Mixed hyperlipidemia: Secondary | ICD-10-CM | POA: Diagnosis not present

## 2016-04-12 DIAGNOSIS — Z1159 Encounter for screening for other viral diseases: Secondary | ICD-10-CM

## 2016-04-12 MED ORDER — GLUCOSE BLOOD VI STRP: ORAL_STRIP | 12 refills | Status: DC

## 2016-04-12 MED ORDER — ONETOUCH DELICA LANCETS FINE MISC: 12 refills | Status: DC

## 2016-04-12 MED ORDER — GLIPIZIDE ER 5 MG PO TB24: 5.0000 mg | ORAL_TABLET | Freq: Every day | ORAL | 3 refills | Status: DC

## 2016-04-12 MED ORDER — ONETOUCH ULTRA 2 W/DEVICE KIT: PACK | 1 refills | Status: DC

## 2016-04-12 NOTE — Progress Notes (Signed)
Pre visit review using our clinic review tool, if applicable. No additional management support is needed unless otherwise documented below in the visit note. 

## 2016-04-12 NOTE — Patient Instructions (Addendum)
You had the Prevnar pneumonia shot today  Please take all new medication as prescribed - the glpizide ER 5 mg per day  Please continue all other medications as before, and refills have been done if requested.  Please have the pharmacy call with any other refills you may need.  Please continue your efforts at being more active, low cholesterol diet, and weight control.  You are otherwise up to date with prevention measures today.  Please keep your appointments with your specialists as you may have planned  Please return in 6 months, or sooner if needed, with Lab testing done 3-5 days before

## 2016-04-12 NOTE — Progress Notes (Signed)
Subjective:    Patient ID: Scott Valentine, male    DOB: 1955-11-26, 60 y.o.   MRN: [id number]  HPI Here for wellness and f/u;  Overall doing ok;  Pt denies Chest pain, worsening SOB, DOE, wheezing, orthopnea, PND, worsening LE edema, palpitations, dizziness or syncope.  Pt denies neurological change such as new headache, facial or extremity weakness. Pt states overall good compliance with treatment and medications, good tolerability, and has been trying to follow appropriate diet.  Pt denies worsening depressive symptoms, suicidal ideation or panic. No fever, night sweats, wt loss, loss of appetite, or other constitutional symptoms.  Pt states good ability with ADL's, has low fall risk, home safety reviewed and adequate, no other significant changes in hearing or vision, and only occasionally active with exercise.   Pt denies polydipsia, polyuria, or low sugar symptoms such as weakness or confusion improved with po intake.  Pt states overall good compliance with meds, trying to follow lower cholesterol, diabetic diet, wt overall stable but little exercise however.   Did not take the glipizide ER 10 qd  No longer sitting in car for long drives to work up to 4 hrs daily, now more time to be active in the past yr.   Wt Readings from Last 3 Encounters:  04/12/16 214 lb 8 oz (97.3 kg)  10/06/15 219 lb (99.3 kg)  03/31/15 223 lb (101.2 kg)  peak wt has been 235 Past Medical History:  Diagnosis Date  . Allergy   . Anxiety state, unspecified 10/08/2007  . Cough 11/08/2009  . Dysmetabolic syndrome X [id number]  . GERD 05/23/2008  . GLUCOSE INTOLERANCE 10/19/2008  . HYPERLIPIDEMIA 10/08/2007  . HYPERPLASIA PROSTATE UNS W/O UR OBST & OTH LUTS 10/08/2007  . Impaired glucose tolerance 12/30/2010  . Other and unspecified hyperlipidemia 05/01/2007  . Rectal bleeding   . 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 TOBACCO USE, QUIT 10/08/2007  . UNS ADVRS EFF UNS RX MEDICINAL&BIOLOGICAL SBSTNC 12/17/2007  .  Unspecified essential hypertension 05/01/2007  . URI 10/29/2007   Past Surgical History:  Procedure Laterality Date  . ANAL FISSURE REPAIR  04/25/11  . finger surgury' Left 05/30/2008   3rd finger  . HEMORRHOID SURGERY  04/25/11    reports that he has been smoking.  He has been smoking about 1.00 pack per day. He has never used smokeless tobacco. He reports that he does not drink alcohol or use drugs. family history includes Coronary artery disease (age of onset: 29) in his father; Diabetes in his mother; Heart attack in his paternal grandfather; Heart disease in his brother and father; Lung cancer in his father; Mental illness in his mother; Prostate cancer in his paternal grandfather. Allergies  Allergen Reactions  . Atorvastatin     REACTION: myalgias  . Ezetimibe     REACTION: myalgias  . Sulfonamide Derivatives     REACTION: stevens johnson   Current Outpatient Prescriptions on File Prior to Visit  Medication Sig Dispense Refill  . aspirin EC 81 MG tablet Take 1 tablet (81 mg total) by mouth daily. 90 tablet 11  . escitalopram (LEXAPRO) 10 MG tablet Take 1 tablet (10 mg total) by mouth daily. 90 tablet 3  . fenofibrate 160 MG tablet TAKE ONE TABLET BY MOUTH ONE TIME DAILY 90 tablet 2  . fexofenadine (ALLEGRA) 180 MG tablet Take 1 tablet (180 mg total) by mouth daily as needed. 90 tablet 3  . folic acid (FOLVITE) 1 MG tablet Take 1 tablet (1  mg total) by mouth daily. 90 tablet 3  . LORazepam (ATIVAN) 0.5 MG tablet TAKE ONE TABLET BY MOUTH DAILY AS NEEDED 90 tablet 1  . metFORMIN (GLUCOPHAGE-XR) 500 MG 24 hr tablet 4 tabs by mouth in the AM 360 tablet 3  . metoprolol tartrate (LOPRESSOR) 25 MG tablet TAKE ONE TABLET BY MOUTH ONE TIME DAILY 90 tablet 3  . Multiple Vitamin (MULTIVITAMIN) tablet Take 1 tablet by mouth daily.      . rosuvastatin (CRESTOR) 40 MG tablet TAKE ONE TABLET BY MOUTH ONE TIME DAILY 90 tablet 1   No current facility-administered medications on file prior to visit.      Review of Systems Constitutional: Negative for increased diaphoresis, or other activity, appetite or siginficant weight change other than noted HENT: Negative for worsening hearing loss, ear pain, facial swelling, mouth sores and neck stiffness.   Eyes: Negative for other worsening pain, redness or visual disturbance.  Respiratory: Negative for choking or stridor Cardiovascular: Negative for other chest pain and palpitations.  Gastrointestinal: Negative for worsening diarrhea, blood in stool, or abdominal distention Genitourinary: Negative for hematuria, flank pain or change in urine volume.  Musculoskeletal: Negative for myalgias or other joint complaints.  Skin: Negative for other color change and wound or drainage.  Neurological: Negative for syncope and numbness. other than noted Hematological: Negative for adenopathy. or other swelling Psychiatric/Behavioral: Negative for hallucinations, SI, self-injury, decreased concentration or other worsening agitation.      Objective:   Physical Exam BP 138/86   Pulse (!) 58   Temp 98.3 F (36.8 C) (Oral)   Resp 17   Ht 5\' 9"  (1.753 m)   Wt 214 lb 8 oz (97.3 kg)   SpO2 98%   BMI 31.68 kg/m  VS noted,  Constitutional: Pt is oriented to person, place, and time. Appears well-developed and well-nourished, in no significant distress Head: Normocephalic and atraumatic  Eyes: Conjunctivae and EOM are normal. Pupils are equal, round, and reactive to light Right Ear: External ear normal.  Left Ear: External ear normal Nose: Nose normal.  Mouth/Throat: Oropharynx is clear and moist  Neck: Normal range of motion. Neck supple. No JVD present. No tracheal deviation present or significant neck LA or mass Cardiovascular: Normal rate, regular rhythm, normal heart sounds and intact distal pulses.   Pulmonary/Chest: Effort normal and breath sounds without rales or wheezing  Abdominal: Soft. Bowel sounds are normal. NT. No HSM  Musculoskeletal:  Normal range of motion. Exhibits no edema Lymphadenopathy: Has no cervical adenopathy.  Neurological: Pt is alert and oriented to person, place, and time. Pt has normal reflexes. No cranial nerve deficit. Motor grossly intact Skin: Skin is warm and dry. No rash noted or new ulcers Psychiatric:  Has normal mood and affect. Behavior is normal.      Assessment & Plan:

## 2016-04-13 NOTE — Assessment & Plan Note (Signed)

## 2016-04-13 NOTE — Assessment & Plan Note (Addendum)
Lab Results  Component Value Date   HGBA1C 8.0 (H) 04/05/2016   Mild uncontrolled, for add glipizide ER 5 qd instead of 10 mg due to recent wt loss and better diet,  to f/u any worsening symptoms or concerns  In addition to the time spent performing CPE, I spent an additional 25 minutes face to face,in which greater than 50% of this time was spent in counseling and coordination of care for patient's acute illness as documented.

## 2016-04-13 NOTE — Assessment & Plan Note (Signed)
stable overall by history and exam, recent data reviewed with pt, and pt to continue medical treatment as before,  to f/u any worsening symptoms or concerns Lab Results  Component Value Date   LDLCALC 84 04/05/2016

## 2016-04-13 NOTE — Assessment & Plan Note (Signed)
stable overall by history and exam, recent data reviewed with pt, and pt to continue medical treatment as before,  to f/u any worsening symptoms or concerns BP Readings from Last 3 Encounters:  04/12/16 138/86  10/06/15 140/88  03/31/15 128/84

## 2016-05-22 ENCOUNTER — Other Ambulatory Visit: Payer: Self-pay | Admitting: *Deleted

## 2016-05-22 MED ORDER — METOPROLOL TARTRATE 25 MG PO TABS: 25.0000 mg | ORAL_TABLET | Freq: Every day | ORAL | 3 refills | Status: DC

## 2016-07-19 ENCOUNTER — Other Ambulatory Visit: Payer: Self-pay | Admitting: Internal Medicine

## 2016-08-24 ENCOUNTER — Other Ambulatory Visit: Payer: Self-pay | Admitting: Internal Medicine

## 2016-09-28 ENCOUNTER — Other Ambulatory Visit: Payer: Self-pay | Admitting: Internal Medicine

## 2016-10-04 ENCOUNTER — Other Ambulatory Visit (INDEPENDENT_AMBULATORY_CARE_PROVIDER_SITE_OTHER): Payer: 59

## 2016-10-04 DIAGNOSIS — E119 Type 2 diabetes mellitus without complications: Secondary | ICD-10-CM | POA: Diagnosis not present

## 2016-10-04 DIAGNOSIS — Z1159 Encounter for screening for other viral diseases: Secondary | ICD-10-CM

## 2016-10-04 LAB — LIPID PANEL
Cholesterol: 157 mg/dL (ref 0–200)
HDL: 35 mg/dL — ABNORMAL LOW (ref >39.00)
LDL Cholesterol: 88 mg/dL (ref 0–99)
NONHDL: 121.53
Total CHOL/HDL Ratio: 4
Triglycerides: 170 mg/dL — ABNORMAL HIGH (ref 0.0–149.0)
VLDL: 34 mg/dL (ref 0.0–40.0)

## 2016-10-04 LAB — BASIC METABOLIC PANEL
BUN: 12 mg/dL (ref 6–23)
CHLORIDE: 104 meq/L (ref 96–112)
CO2: 28 meq/L (ref 19–32)
Calcium: 9.8 mg/dL (ref 8.4–10.5)
Creatinine, Ser: 1.02 mg/dL (ref 0.40–1.50)
GFR: 79.51 mL/min (ref >60.00)
Glucose, Bld: 162 mg/dL — ABNORMAL HIGH (ref 70–99)
Potassium: 4.2 mEq/L (ref 3.5–5.1)
SODIUM: 137 meq/L (ref 135–145)

## 2016-10-04 LAB — HEMOGLOBIN A1C: HEMOGLOBIN A1C: 7.6 % — AB (ref 4.6–6.5)

## 2016-10-04 LAB — HEPATIC FUNCTION PANEL
ALK PHOS: 41 U/L (ref 39–117)
ALT: 15 U/L (ref 0–53)
AST: 16 U/L (ref 0–37)
Albumin: 4.5 g/dL (ref 3.5–5.2)
BILIRUBIN DIRECT: 0.2 mg/dL (ref 0.0–0.3)
TOTAL PROTEIN: 7.7 g/dL (ref 6.0–8.3)
Total Bilirubin: 0.5 mg/dL (ref 0.2–1.2)

## 2016-10-04 LAB — HEPATITIS C ANTIBODY: HCV AB: NEGATIVE

## 2016-10-11 ENCOUNTER — Encounter: Payer: Self-pay | Admitting: Internal Medicine

## 2016-10-11 ENCOUNTER — Ambulatory Visit (INDEPENDENT_AMBULATORY_CARE_PROVIDER_SITE_OTHER): Payer: 59 | Admitting: Internal Medicine

## 2016-10-11 VITALS — BP 140/80 | HR 73 | Temp 98.1°F | Resp 20 | Wt 222.5 lb

## 2016-10-11 DIAGNOSIS — E119 Type 2 diabetes mellitus without complications: Secondary | ICD-10-CM | POA: Diagnosis not present

## 2016-10-11 DIAGNOSIS — F411 Generalized anxiety disorder: Secondary | ICD-10-CM | POA: Diagnosis not present

## 2016-10-11 DIAGNOSIS — I10 Essential (primary) hypertension: Secondary | ICD-10-CM

## 2016-10-11 DIAGNOSIS — E782 Mixed hyperlipidemia: Secondary | ICD-10-CM | POA: Diagnosis not present

## 2016-10-11 DIAGNOSIS — Z0001 Encounter for general adult medical examination with abnormal findings: Secondary | ICD-10-CM

## 2016-10-11 MED ORDER — METFORMIN HCL ER 500 MG PO TB24: ORAL_TABLET | ORAL | 3 refills | Status: DC

## 2016-10-11 MED ORDER — LOSARTAN POTASSIUM 50 MG PO TABS: 50.0000 mg | ORAL_TABLET | Freq: Every day | ORAL | 3 refills | Status: DC

## 2016-10-11 MED ORDER — LORAZEPAM 0.5 MG PO TABS
0.5000 mg | ORAL_TABLET | Freq: Every day | ORAL | 1 refills | Status: DC | PRN
Start: 2016-10-11 — End: 2017-04-11

## 2016-10-11 MED ORDER — METOPROLOL TARTRATE 25 MG PO TABS: 12.5000 mg | ORAL_TABLET | Freq: Every day | ORAL | 3 refills | Status: DC

## 2016-10-11 MED ORDER — ROSUVASTATIN CALCIUM 40 MG PO TABS: 40.0000 mg | ORAL_TABLET | Freq: Every day | ORAL | 3 refills | Status: DC

## 2016-10-11 MED ORDER — FENOFIBRATE 160 MG PO TABS: 160.0000 mg | ORAL_TABLET | Freq: Every day | ORAL | 3 refills | Status: DC

## 2016-10-11 MED ORDER — ESCITALOPRAM OXALATE 20 MG PO TABS: 20.0000 mg | ORAL_TABLET | Freq: Every day | ORAL | 3 refills | Status: DC

## 2016-10-11 MED ORDER — GLIPIZIDE ER 10 MG PO TB24: 10.0000 mg | ORAL_TABLET | Freq: Every day | ORAL | 3 refills | Status: DC

## 2016-10-11 NOTE — Assessment & Plan Note (Signed)
Only take half the lopressor, BP remains slightly elevated, to add losartan 50 qd,  to f/u any worsening symptoms or concerns

## 2016-10-11 NOTE — Assessment & Plan Note (Signed)
Mild uncontrolled, to incresae the lexapro to 20 qd, cont benzo prn

## 2016-10-11 NOTE — Patient Instructions (Signed)
Please take all new medication as prescribed - the losartan for blood pressure and kidney protection  OK to take HALF the lopressor (metoprolol) as you do  OK to increase the lexapro to 20 mg per day   Please continue all other medications as before, and  All refills have been done  Please have the pharmacy call with any other refills you may need.  Please continue your efforts at being more active, low cholesterol diet, and weight control.  Please keep your appointments with your specialists as you may have planned  Please return in 6 months, or sooner if needed, with Lab testing done 3-5 days before

## 2016-10-11 NOTE — Progress Notes (Signed)
Pre visit review using our clinic review tool, if applicable. No additional management support is needed unless otherwise documented below in the visit note. 

## 2016-10-11 NOTE — Progress Notes (Signed)
Subjective:    Patient ID: Scott Valentine, male    DOB: [birth year], 61 y.o.   MRN: [id number]  HPI  Here to f/u; overall doing ok,  Pt denies chest pain, increasing sob or doe, wheezing, orthopnea, PND, increased LE swelling, palpitations, dizziness or syncope.  Pt denies new neurological symptoms such as new headache, or facial or extremity weakness or numbness.  Pt denies polydipsia, polyuria, or low sugar episode.   Pt denies new neurological symptoms such as new headache, or facial or extremity weakness or numbness.   Pt states overall good compliance with meds, mostly trying to follow appropriate diet, with wt overall stable,  but little exercise however.  Has more anxiety recently, some wt gain, BP remains elevated and asks for benzo refill.  No other new hisotry BP Readings from Last 3 Encounters:  10/11/16 140/80  04/12/16 138/86  10/06/15 140/88   Wt Readings from Last 3 Encounters:  10/11/16 222 lb 8 oz (100.9 kg)  04/12/16 214 lb 8 oz (97.3 kg)  10/06/15 219 lb (99.3 kg)   Past Medical History:  Diagnosis Date  . Allergy   . Anxiety state, unspecified 10/08/2007  . Cough 11/08/2009  . Dysmetabolic syndrome X 2/50/0370  . GERD 05/23/2008  . GLUCOSE INTOLERANCE 10/19/2008  . HYPERLIPIDEMIA 10/08/2007  . HYPERPLASIA PROSTATE UNS W/O UR OBST & OTH LUTS 10/08/2007  . Impaired glucose tolerance 12/30/2010  . Other and unspecified hyperlipidemia 05/01/2007  . Rectal bleeding   . 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 TOBACCO USE, QUIT 10/08/2007  . UNS ADVRS EFF UNS RX MEDICINAL&BIOLOGICAL SBSTNC 12/17/2007  . Unspecified essential hypertension 05/01/2007  . URI 10/29/2007   Past Surgical History:  Procedure Laterality Date  . ANAL FISSURE REPAIR  04/25/11  . finger surgury' Left 05/30/2008   3rd finger  . HEMORRHOID SURGERY  04/25/11    reports that he has been smoking.  He has been smoking about 1.00 pack per day. He has never used smokeless tobacco. He reports that he does  not drink alcohol or use drugs. family history includes Coronary artery disease (age of onset: 47) in his father; Diabetes in his mother; Heart attack in his paternal grandfather; Heart disease in his brother and father; Lung cancer in his father; Mental illness in his mother; Prostate cancer in his paternal grandfather. Allergies  Allergen Reactions  . Atorvastatin     REACTION: myalgias  . Ezetimibe     REACTION: myalgias  . Sulfonamide Derivatives     REACTION: stevens johnson   Current Outpatient Prescriptions on File Prior to Visit  Medication Sig Dispense Refill  . aspirin EC 81 MG tablet Take 1 tablet (81 mg total) by mouth daily. 90 tablet 11  . Blood Glucose Monitoring Suppl (ONE TOUCH ULTRA 2) w/Device KIT Use glucometer to test sugars 1-2 times daily. Dx. E11.9 1 each 1  . fexofenadine (ALLEGRA) 180 MG tablet Take 1 tablet (180 mg total) by mouth daily as needed. 90 tablet 3  . folic acid (FOLVITE) 1 MG tablet Take 1 tablet (1 mg total) by mouth daily. 90 tablet 3  . folic acid (FOLVITE) 488 MCG tablet Take 400 mcg by mouth daily.    Marland Kitchen glucose blood (ONE TOUCH ULTRA TEST) test strip Please use 1-2 test strips to test sugars daily. Dx. E11.9 100 each 12  . Multiple Vitamin (MULTIVITAMIN) tablet Take 1 tablet by mouth daily.      . Omega 3 1200 MG CAPS Take  by mouth.    Glory Rosebush DELICA LANCETS FINE MISC Please use 1-2 lancet  to test sugars daily. Dx E11.9 100 each 12   No current facility-administered medications on file prior to visit.    Review of Systems  Constitutional: Negative for unusual diaphoresis or night sweats HENT: Negative for ear swelling or discharge Eyes: Negative for worsening visual haziness  Respiratory: Negative for choking and stridor.   Gastrointestinal: Negative for distension or worsening eructation Genitourinary: Negative for retention or change in urine volume.  Musculoskeletal: Negative for other MSK pain or swelling Skin: Negative for color  change and worsening wound Neurological: Negative for tremors and numbness other than noted  Psychiatric/Behavioral: Negative for decreased concentration or agitation other than above   All other system neg per pt    Objective:   Physical Exam BP 140/80   Pulse 73   Temp 98.1 F (36.7 C) (Oral)   Resp 20   Wt 222 lb 8 oz (100.9 kg)   SpO2 97%   BMI 32.86 kg/m  VS noted,  Constitutional: Pt appears in no apparent distress HENT: Head: NCAT.  Right Ear: External ear normal.  Left Ear: External ear normal.  Eyes: . Pupils are equal, round, and reactive to light. Conjunctivae and EOM are normal Neck: Normal range of motion. Neck supple.  Cardiovascular: Normal rate and regular rhythm.   Pulmonary/Chest: Effort normal and breath sounds without rales or wheezing.  Neurological: Pt is alert. Not confused , motor grossly intact Skin: Skin is warm. No rash, no LE edema Psychiatric: Pt behavior is normal. No agitation.  No other new exam findings      Assessment & Plan:

## 2016-10-11 NOTE — Assessment & Plan Note (Signed)
stable overall by history and exam, recent data reviewed with pt, and pt to continue medical treatment as before,  to f/u any worsening symptoms or concerns Lab Results  Component Value Date   LDLCALC 88 10/04/2016

## 2016-10-11 NOTE — Assessment & Plan Note (Signed)
Mild uncontrolled, to increase the glipizide er to 10q d, cont diet and wt loss efforts Lab Results  Component Value Date   HGBA1C 7.6 (H) 10/04/2016

## 2017-04-04 ENCOUNTER — Other Ambulatory Visit (INDEPENDENT_AMBULATORY_CARE_PROVIDER_SITE_OTHER): Payer: 59

## 2017-04-04 DIAGNOSIS — Z0001 Encounter for general adult medical examination with abnormal findings: Secondary | ICD-10-CM | POA: Diagnosis not present

## 2017-04-04 DIAGNOSIS — E119 Type 2 diabetes mellitus without complications: Secondary | ICD-10-CM | POA: Diagnosis not present

## 2017-04-04 LAB — MICROALBUMIN / CREATININE URINE RATIO
Creatinine,U: 97.5 mg/dL
MICROALB/CREAT RATIO: 0.7 mg/g (ref 0.0–30.0)
Microalb, Ur: <0.7 mg/dL (ref 0.0–1.9)

## 2017-04-04 LAB — BASIC METABOLIC PANEL
BUN: 11 mg/dL (ref 6–23)
CHLORIDE: 105 meq/L (ref 96–112)
CO2: 29 meq/L (ref 19–32)
Calcium: 9.5 mg/dL (ref 8.4–10.5)
Creatinine, Ser: 1.11 mg/dL (ref 0.40–1.50)
GFR: 71.99 mL/min (ref >60.00)
GLUCOSE: 155 mg/dL — AB (ref 70–99)
Potassium: 4.8 mEq/L (ref 3.5–5.1)
Sodium: 140 mEq/L (ref 135–145)

## 2017-04-04 LAB — URINALYSIS, ROUTINE W REFLEX MICROSCOPIC
Bilirubin Urine: NEGATIVE
Hgb urine dipstick: NEGATIVE
Ketones, ur: NEGATIVE
Leukocytes, UA: NEGATIVE
Nitrite: NEGATIVE
RBC / HPF: NONE SEEN
Specific Gravity, Urine: 1.01
Total Protein, Urine: NEGATIVE
Urine Glucose: NEGATIVE
Urobilinogen, UA: 0.2
pH: 7 (ref 5.0–8.0)

## 2017-04-04 LAB — CBC WITH DIFFERENTIAL/PLATELET
BASOS PCT: 1 % (ref 0.0–3.0)
Basophils Absolute: 0.1 10*3/uL (ref 0.0–0.1)
EOS PCT: 5.6 % — AB (ref 0.0–5.0)
Eosinophils Absolute: 0.3 10*3/uL (ref 0.0–0.7)
HEMATOCRIT: 41.2 % (ref 39.0–52.0)
HEMOGLOBIN: 13.9 g/dL (ref 13.0–17.0)
LYMPHS PCT: 24 % (ref 12.0–46.0)
Lymphs Abs: 1.3 10*3/uL (ref 0.7–4.0)
MCHC: 33.9 g/dL (ref 30.0–36.0)
MCV: 92.1 fl (ref 78.0–100.0)
MONOS PCT: 7.7 % (ref 3.0–12.0)
Monocytes Absolute: 0.4 10*3/uL (ref 0.1–1.0)
NEUTROS ABS: 3.3 10*3/uL (ref 1.4–7.7)
Neutrophils Relative %: 61.7 % (ref 43.0–77.0)
Platelets: 208 10*3/uL (ref 150.0–400.0)
RBC: 4.47 Mil/uL (ref 4.22–5.81)
RDW: 13.1 % (ref 11.5–15.5)
WBC: 5.3 10*3/uL (ref 4.0–10.5)

## 2017-04-04 LAB — LIPID PANEL
CHOLESTEROL: 133 mg/dL (ref 0–200)
HDL: 32.3 mg/dL — ABNORMAL LOW (ref >39.00)
LDL Cholesterol: 68 mg/dL (ref 0–99)
NonHDL: 100.72
TRIGLYCERIDES: 162 mg/dL — AB (ref 0.0–149.0)
Total CHOL/HDL Ratio: 4
VLDL: 32.4 mg/dL (ref 0.0–40.0)

## 2017-04-04 LAB — HEPATIC FUNCTION PANEL
ALBUMIN: 4.3 g/dL (ref 3.5–5.2)
ALT: 14 U/L (ref 0–53)
AST: 16 U/L (ref 0–37)
Alkaline Phosphatase: 30 U/L — ABNORMAL LOW (ref 39–117)
BILIRUBIN TOTAL: 0.5 mg/dL (ref 0.2–1.2)
Bilirubin, Direct: 0.1 mg/dL (ref 0.0–0.3)
TOTAL PROTEIN: 7 g/dL (ref 6.0–8.3)

## 2017-04-04 LAB — TSH: TSH: 2.4 u[IU]/mL (ref 0.35–4.50)

## 2017-04-04 LAB — HEMOGLOBIN A1C: Hgb A1c MFr Bld: 7.3 % — ABNORMAL HIGH (ref 4.6–6.5)

## 2017-04-04 LAB — PSA: PSA: 1.16 ng/mL (ref 0.10–4.00)

## 2017-04-11 ENCOUNTER — Ambulatory Visit (INDEPENDENT_AMBULATORY_CARE_PROVIDER_SITE_OTHER): Payer: 59 | Admitting: Internal Medicine

## 2017-04-11 ENCOUNTER — Encounter: Payer: Self-pay | Admitting: Internal Medicine

## 2017-04-11 VITALS — BP 138/86 | HR 64 | Ht 69.0 in | Wt 214.0 lb

## 2017-04-11 DIAGNOSIS — Z23 Encounter for immunization: Secondary | ICD-10-CM

## 2017-04-11 DIAGNOSIS — E119 Type 2 diabetes mellitus without complications: Secondary | ICD-10-CM

## 2017-04-11 DIAGNOSIS — Z0000 Encounter for general adult medical examination without abnormal findings: Secondary | ICD-10-CM

## 2017-04-11 MED ORDER — ESCITALOPRAM OXALATE 20 MG PO TABS: 20.0000 mg | ORAL_TABLET | Freq: Every day | ORAL | 3 refills | Status: DC

## 2017-04-11 MED ORDER — METOPROLOL TARTRATE 25 MG PO TABS: 12.5000 mg | ORAL_TABLET | Freq: Every day | ORAL | 3 refills | Status: DC

## 2017-04-11 MED ORDER — FOLIC ACID 400 MCG PO TABS
400.0000 ug | ORAL_TABLET | Freq: Every day | ORAL | 3 refills | Status: DC
Start: 2017-04-11 — End: 2018-09-25

## 2017-04-11 MED ORDER — METFORMIN HCL ER 500 MG PO TB24: ORAL_TABLET | ORAL | 3 refills | Status: DC

## 2017-04-11 MED ORDER — FENOFIBRATE 160 MG PO TABS: 160.0000 mg | ORAL_TABLET | Freq: Every day | ORAL | 3 refills | Status: DC

## 2017-04-11 MED ORDER — ROSUVASTATIN CALCIUM 40 MG PO TABS: 40.0000 mg | ORAL_TABLET | Freq: Every day | ORAL | 3 refills | Status: DC

## 2017-04-11 MED ORDER — LORAZEPAM 0.5 MG PO TABS: 0.5000 mg | ORAL_TABLET | Freq: Every day | ORAL | 1 refills | Status: DC | PRN

## 2017-04-11 MED ORDER — LOSARTAN POTASSIUM 50 MG PO TABS: 50.0000 mg | ORAL_TABLET | Freq: Every day | ORAL | 3 refills | Status: DC

## 2017-04-11 MED ORDER — GLIPIZIDE ER 10 MG PO TB24: 10.0000 mg | ORAL_TABLET | Freq: Every day | ORAL | 3 refills | Status: DC

## 2017-04-11 NOTE — Patient Instructions (Addendum)
You had the Tdap Tetanus shot today, and Prevnar 13 pneumonia shot  Please continue all other medications as before, and refills have been done if requested.  Please have the pharmacy call with any other refills you may need.  Please continue your efforts at being more active, low cholesterol diet, and weight control.  You are otherwise up to date with prevention measures today.  Please keep your appointments with your specialists as you may have planned  Please return in 6 months, or sooner if needed, with Lab testing done 3-5 days before

## 2017-04-11 NOTE — Progress Notes (Signed)
Subjective:    Patient ID: Scott Valentine, male    DOB: 1955-10-03, 61 y.o.   MRN: [id number]  HPI  Here for wellness and f/u;  Overall doing ok;  Pt denies Chest pain, worsening SOB, DOE, wheezing, orthopnea, PND, worsening LE edema, palpitations, dizziness or syncope.  Pt denies neurological change such as new headache, facial or extremity weakness.  Pt denies polydipsia, polyuria, or low sugar symptoms. Pt states overall good compliance with treatment and medications, good tolerability, and has been trying to follow appropriate diet.  Pt denies worsening depressive symptoms, suicidal ideation or panic. No fever, night sweats, wt loss, loss of appetite, or other constitutional symptoms.  Pt states good ability with ADL's, has low fall risk, home safety reviewed and adequate, no other significant changes in hearing or vision, and not active with exercise except now more active position at his work, no longer sitting at desk.  No other exam findings Past Medical History:  Diagnosis Date  . Allergy   . Anxiety state, unspecified 10/08/2007  . Cough 11/08/2009  . Dysmetabolic syndrome X 10/11/5425  . GERD 05/23/2008  . GLUCOSE INTOLERANCE 10/19/2008  . HYPERLIPIDEMIA 10/08/2007  . HYPERPLASIA PROSTATE UNS W/O UR OBST & OTH LUTS 10/08/2007  . Impaired glucose tolerance 12/30/2010  . Other and unspecified hyperlipidemia 05/01/2007  . Rectal bleeding   . 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 TOBACCO USE, QUIT 10/08/2007  . UNS ADVRS EFF UNS RX MEDICINAL&BIOLOGICAL SBSTNC 12/17/2007  . Unspecified essential hypertension 05/01/2007  . URI 10/29/2007   Past Surgical History:  Procedure Laterality Date  . ANAL FISSURE REPAIR  04/25/11  . finger surgury' Left 05/30/2008   3rd finger  . HEMORRHOID SURGERY  04/25/11    reports that he has been smoking.  He has been smoking about 1.00 pack per day. He has never used smokeless tobacco. He reports that he does not drink alcohol or use drugs. family history  includes Coronary artery disease (age of onset: 32) in his father; Diabetes in his mother; Heart attack in his paternal grandfather; Heart disease in his brother and father; Lung cancer in his father; Mental illness in his mother; Prostate cancer in his paternal grandfather. Allergies  Allergen Reactions  . Atorvastatin     REACTION: myalgias  . Ezetimibe     REACTION: myalgias  . Sulfonamide Derivatives     REACTION: stevens johnson   Current Outpatient Prescriptions on File Prior to Visit  Medication Sig Dispense Refill  . aspirin EC 81 MG tablet Take 1 tablet (81 mg total) by mouth daily. 90 tablet 11  . Blood Glucose Monitoring Suppl (ONE TOUCH ULTRA 2) w/Device KIT Use glucometer to test sugars 1-2 times daily. Dx. E11.9 1 each 1  . fexofenadine (ALLEGRA) 180 MG tablet Take 1 tablet (180 mg total) by mouth daily as needed. 90 tablet 3  . folic acid (FOLVITE) 1 MG tablet Take 1 tablet (1 mg total) by mouth daily. 90 tablet 3  . glucose blood (ONE TOUCH ULTRA TEST) test strip Please use 1-2 test strips to test sugars daily. Dx. E11.9 100 each 12  . Multiple Vitamin (MULTIVITAMIN) tablet Take 1 tablet by mouth daily.      . Omega 3 1200 MG CAPS Take by mouth.    Glory Rosebush DELICA LANCETS FINE MISC Please use 1-2 lancet  to test sugars daily. Dx E11.9 100 each 12   No current facility-administered medications on file prior to visit.  Review of Systems Constitutional: Negative for other unusual diaphoresis, sweats, appetite or weight changes HENT: Negative for other worsening hearing loss, ear pain, facial swelling, mouth sores or neck stiffness.   Eyes: Negative for other worsening pain, redness or other visual disturbance.  Respiratory: Negative for other stridor or swelling Cardiovascular: Negative for other palpitations or other chest pain  Gastrointestinal: Negative for worsening diarrhea or loose stools, blood in stool, distention or other pain Genitourinary: Negative for  hematuria, flank pain or other change in urine volume.  Musculoskeletal: Negative for myalgias or other joint swelling.  Skin: Negative for other color change, or other wound or worsening drainage.  Neurological: Negative for other syncope or numbness. Hematological: Negative for other adenopathy or swelling Psychiatric/Behavioral: Negative for hallucinations, other worsening agitation, SI, self-injury, or new decreased concentration All other system neg per pt    Objective:   Physical Exam BP 138/86   Pulse 64   Ht '5\' 9"'  (1.753 m)   Wt 214 lb (97.1 kg)   SpO2 99%   BMI 31.60 kg/m  VS noted,  Constitutional: Pt is oriented to person, place, and time. Appears well-developed and well-nourished, in no significant distress and comfortable Head: Normocephalic and atraumatic  Eyes: Conjunctivae and EOM are normal. Pupils are equal, round, and reactive to light Right Ear: External ear normal without discharge Left Ear: External ear normal without discharge Nose: Nose without discharge or deformity Mouth/Throat: Oropharynx is without other ulcerations and moist  Neck: Normal range of motion. Neck supple. No JVD present. No tracheal deviation present or significant neck LA or mass Cardiovascular: Normal rate, regular rhythm, normal heart sounds and intact distal pulses.   Pulmonary/Chest: WOB normal and breath sounds without rales or wheezing  Abdominal: Soft. Bowel sounds are normal. NT. No HSM  Musculoskeletal: Normal range of motion. Exhibits no edema Lymphadenopathy: Has no other cervical adenopathy.  Neurological: Pt is alert and oriented to person, place, and time. Pt has normal reflexes. No cranial nerve deficit. Motor grossly intact, Gait intact Skin: Skin is warm and dry. No rash noted or new ulcerations Psychiatric:  Has normal mood and affect. Behavior is normal without agitation No other exam findings Lab Results  Component Value Date   WBC 5.3 04/04/2017   HGB 13.9  04/04/2017   HCT 41.2 04/04/2017   PLT 208.0 04/04/2017   GLUCOSE 155 (H) 04/04/2017   CHOL 133 04/04/2017   TRIG 162.0 (H) 04/04/2017   HDL 32.30 (L) 04/04/2017   LDLDIRECT 71.0 03/24/2015   LDLCALC 68 04/04/2017   ALT 14 04/04/2017   AST 16 04/04/2017   NA 140 04/04/2017   K 4.8 04/04/2017   CL 105 04/04/2017   CREATININE 1.11 04/04/2017   BUN 11 04/04/2017   CO2 29 04/04/2017   TSH 2.40 04/04/2017   PSA 1.16 04/04/2017   HGBA1C 7.3 (H) 04/04/2017   MICROALBUR <0.7 04/04/2017          Assessment & Plan:

## 2017-04-13 NOTE — Assessment & Plan Note (Signed)
stable overall by history and exam, recent data reviewed with pt, and pt to continue medical treatment as before,  to f/u any worsening symptoms or concerns Lab Results  Component Value Date   HGBA1C 7.3 (H) 04/04/2017

## 2017-04-13 NOTE — Assessment & Plan Note (Signed)

## 2017-07-10 ENCOUNTER — Ambulatory Visit (INDEPENDENT_AMBULATORY_CARE_PROVIDER_SITE_OTHER): Payer: 59 | Admitting: General Practice

## 2017-07-10 DIAGNOSIS — Z23 Encounter for immunization: Secondary | ICD-10-CM

## 2017-10-03 ENCOUNTER — Other Ambulatory Visit (INDEPENDENT_AMBULATORY_CARE_PROVIDER_SITE_OTHER): Payer: 59

## 2017-10-03 DIAGNOSIS — E119 Type 2 diabetes mellitus without complications: Secondary | ICD-10-CM

## 2017-10-03 LAB — BASIC METABOLIC PANEL
BUN: 14 mg/dL (ref 6–23)
CHLORIDE: 102 meq/L (ref 96–112)
CO2: 28 meq/L (ref 19–32)
CREATININE: 1.11 mg/dL (ref 0.40–1.50)
Calcium: 9.6 mg/dL (ref 8.4–10.5)
GFR: 71.87 mL/min (ref >60.00)
Glucose, Bld: 165 mg/dL — ABNORMAL HIGH (ref 70–99)
Potassium: 4.3 mEq/L (ref 3.5–5.1)
Sodium: 136 mEq/L (ref 135–145)

## 2017-10-03 LAB — LIPID PANEL
CHOL/HDL RATIO: 4
CHOLESTEROL: 143 mg/dL (ref 0–200)
HDL: 32.4 mg/dL — ABNORMAL LOW (ref >39.00)
LDL CALC: 74 mg/dL (ref 0–99)
NONHDL: 111.09
Triglycerides: 185 mg/dL — ABNORMAL HIGH (ref 0.0–149.0)
VLDL: 37 mg/dL (ref 0.0–40.0)

## 2017-10-03 LAB — HEPATIC FUNCTION PANEL
ALK PHOS: 38 U/L — AB (ref 39–117)
ALT: 17 U/L (ref 0–53)
AST: 18 U/L (ref 0–37)
Albumin: 4.5 g/dL (ref 3.5–5.2)
BILIRUBIN DIRECT: 0.1 mg/dL (ref 0.0–0.3)
TOTAL PROTEIN: 7.5 g/dL (ref 6.0–8.3)
Total Bilirubin: 0.5 mg/dL (ref 0.2–1.2)

## 2017-10-03 LAB — HEMOGLOBIN A1C: HEMOGLOBIN A1C: 7.9 % — AB (ref 4.6–6.5)

## 2017-10-10 ENCOUNTER — Encounter: Payer: Self-pay | Admitting: Internal Medicine

## 2017-10-10 ENCOUNTER — Ambulatory Visit (INDEPENDENT_AMBULATORY_CARE_PROVIDER_SITE_OTHER): Payer: 59 | Admitting: Internal Medicine

## 2017-10-10 VITALS — BP 126/84 | HR 75 | Temp 97.8°F | Ht 69.0 in | Wt 219.0 lb

## 2017-10-10 DIAGNOSIS — E782 Mixed hyperlipidemia: Secondary | ICD-10-CM | POA: Diagnosis not present

## 2017-10-10 DIAGNOSIS — I10 Essential (primary) hypertension: Secondary | ICD-10-CM | POA: Diagnosis not present

## 2017-10-10 DIAGNOSIS — Z0000 Encounter for general adult medical examination without abnormal findings: Secondary | ICD-10-CM | POA: Diagnosis not present

## 2017-10-10 DIAGNOSIS — E119 Type 2 diabetes mellitus without complications: Secondary | ICD-10-CM

## 2017-10-10 DIAGNOSIS — Z23 Encounter for immunization: Secondary | ICD-10-CM | POA: Diagnosis not present

## 2017-10-10 DIAGNOSIS — Z114 Encounter for screening for human immunodeficiency virus [HIV]: Secondary | ICD-10-CM

## 2017-10-10 MED ORDER — ZOSTER VAC RECOMB ADJUVANTED 50 MCG/0.5ML IM SUSR: 0.5000 mL | Freq: Once | INTRAMUSCULAR | 1 refills | Status: AC

## 2017-10-10 NOTE — Patient Instructions (Addendum)
Your shingles shot was sent to the pharmacy  You had the Pneumovax today  Please continue all other medications as before, and refills have been done if requested.  Please have the pharmacy call with any other refills you may need.  Please continue your efforts at being more active, low cholesterol diet, and weight control  Please keep your appointments with your specialists as you may have planned  Please return in 6 months, or sooner if needed, with Lab testing done 3-5 days before

## 2017-10-10 NOTE — Progress Notes (Signed)
Subjective:    Patient ID: Scott Valentine, male    DOB: 1956/09/05, 62 y.o.   MRN: [id number]  HPI  Here to f/u; overall doing ok,  Pt denies chest pain, increasing sob or doe, wheezing, orthopnea, PND, increased LE swelling, palpitations, dizziness or syncope.  Pt denies new neurological symptoms such as new headache, or facial or extremity weakness or numbness.  Pt denies polydipsia, polyuria, or low sugar episode.  Pt states overall good compliance with meds, mostly trying to follow appropriate diet, with wt overall stable,  but little exercise however. .Has allergies ok with allegra. No other interval hx or new complaints Wt Readings from Last 3 Encounters:  10/10/17 219 lb (99.3 kg)  04/11/17 214 lb (97.1 kg)  10/11/16 222 lb 8 oz (100.9 kg)   Past Medical History:  Diagnosis Date  . Allergy   . Anxiety state, unspecified 10/08/2007  . Cough 11/08/2009  . Dysmetabolic syndrome X 11/28/252  . GERD 05/23/2008  . GLUCOSE INTOLERANCE 10/19/2008  . HYPERLIPIDEMIA 10/08/2007  . HYPERPLASIA PROSTATE UNS W/O UR OBST & OTH LUTS 10/08/2007  . Impaired glucose tolerance 12/30/2010  . Other and unspecified hyperlipidemia 05/01/2007  . Rectal bleeding   . 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 TOBACCO USE, QUIT 10/08/2007  . UNS ADVRS EFF UNS RX MEDICINAL&BIOLOGICAL SBSTNC 12/17/2007  . Unspecified essential hypertension 05/01/2007  . URI 10/29/2007   Past Surgical History:  Procedure Laterality Date  . ANAL FISSURE REPAIR  04/25/11  . finger surgury' Left 05/30/2008   3rd finger  . HEMORRHOID SURGERY  04/25/11    reports that he has been smoking.  He has been smoking about 1.00 pack per day. he has never used smokeless tobacco. He reports that he does not drink alcohol or use drugs. family history includes Coronary artery disease (age of onset: 60) in his father; Diabetes in his mother; Heart attack in his paternal grandfather; Heart disease in his brother and father; Lung cancer in his  father; Mental illness in his mother; Prostate cancer in his paternal grandfather. Allergies  Allergen Reactions  . Atorvastatin     REACTION: myalgias  . Ezetimibe     REACTION: myalgias  . Sulfonamide Derivatives     REACTION: stevens johnson   Current Outpatient Medications on File Prior to Visit  Medication Sig Dispense Refill  . aspirin EC 81 MG tablet Take 1 tablet (81 mg total) by mouth daily. 90 tablet 11  . Blood Glucose Monitoring Suppl (ONE TOUCH ULTRA 2) w/Device KIT Use glucometer to test sugars 1-2 times daily. Dx. E11.9 1 each 1  . escitalopram (LEXAPRO) 20 MG tablet Take 1 tablet (20 mg total) by mouth daily. 90 tablet 3  . fenofibrate 160 MG tablet Take 1 tablet (160 mg total) by mouth daily. 90 tablet 3  . fexofenadine (ALLEGRA) 180 MG tablet Take 1 tablet (180 mg total) by mouth daily as needed. 90 tablet 3  . folic acid (FOLVITE) 1 MG tablet Take 1 tablet (1 mg total) by mouth daily. 90 tablet 3  . folic acid (FOLVITE) 270 MCG tablet Take 1 tablet (400 mcg total) by mouth daily. 90 tablet 3  . glipiZIDE (GLUCOTROL XL) 10 MG 24 hr tablet Take 1 tablet (10 mg total) by mouth daily with breakfast. 90 tablet 3  . glucose blood (ONE TOUCH ULTRA TEST) test strip Please use 1-2 test strips to test sugars daily. Dx. E11.9 100 each 12  . LORazepam (ATIVAN) 0.5 MG  tablet Take 1 tablet (0.5 mg total) by mouth daily as needed. 90 tablet 1  . losartan (COZAAR) 50 MG tablet Take 1 tablet (50 mg total) by mouth daily. 90 tablet 3  . metFORMIN (GLUCOPHAGE-XR) 500 MG 24 hr tablet Take 4 Tablets by mouth every morning 360 tablet 3  . metoprolol tartrate (LOPRESSOR) 25 MG tablet Take 0.5 tablets (12.5 mg total) by mouth daily. 45 tablet 3  . Multiple Vitamin (MULTIVITAMIN) tablet Take 1 tablet by mouth daily.      . Omega 3 1200 MG CAPS Take by mouth.    Glory Rosebush DELICA LANCETS FINE MISC Please use 1-2 lancet  to test sugars daily. Dx E11.9 100 each 12  . rosuvastatin (CRESTOR) 40 MG  tablet Take 1 tablet (40 mg total) by mouth daily. 90 tablet 3   No current facility-administered medications on file prior to visit.    Review of Systems  Constitutional: Negative for other unusual diaphoresis or sweats HENT: Negative for ear discharge or swelling Eyes: Negative for other worsening visual disturbances Respiratory: Negative for stridor or other swelling  Gastrointestinal: Negative for worsening distension or other blood Genitourinary: Negative for retention or other urinary change Musculoskeletal: Negative for other MSK pain or swelling Skin: Negative for color change or other new lesions Neurological: Negative for worsening tremors and other numbness  Psychiatric/Behavioral: Negative for worsening agitation or other fatigue All other system neg per pt    Objective:   Physical Exam BP 126/84   Pulse 75   Temp 97.8 F (36.6 C) (Oral)   Ht '5\' 9"'  (1.753 m)   Wt 219 lb (99.3 kg)   SpO2 97%   BMI 32.34 kg/m  VS noted,  Constitutional: Pt appears in NAD HENT: Head: NCAT.  Right Ear: External ear normal.  Left Ear: External ear normal.  Eyes: . Pupils are equal, round, and reactive to light. Conjunctivae and EOM are normal Nose: without d/c or deformity Neck: Neck supple. Gross normal ROM Cardiovascular: Normal rate and regular rhythm.   Pulmonary/Chest: Effort normal and breath sounds without rales or wheezing.  Neurological: Pt is alert. At baseline orientation, motor grossly intact Skin: Skin is warm. No rashes, other new lesions, no LE edema Psychiatric: Pt behavior is normal without agitation  No other exam findings    Assessment & Plan:

## 2017-10-11 NOTE — Assessment & Plan Note (Signed)
stable overall by history and exam, recent data reviewed with pt, and pt to continue medical treatment as before,  to f/u any worsening symptoms or concerns,. For f/u a1c with labs

## 2017-10-11 NOTE — Assessment & Plan Note (Signed)
stable overall by history and exam, recent data reviewed with pt, and pt to continue medical treatment as before,  to f/u any worsening symptoms or concerns BP Readings from Last 3 Encounters:  10/10/17 126/84  04/11/17 138/86  10/11/16 140/80

## 2017-10-11 NOTE — Assessment & Plan Note (Signed)
stable overall by history and exam, recent data reviewed with pt, and pt to continue medical treatment as before,  to f/u any worsening symptoms or concerns Lab Results  Component Value Date   LDLCALC 74 10/03/2017

## 2017-11-18 ENCOUNTER — Other Ambulatory Visit: Payer: Self-pay | Admitting: Internal Medicine

## 2017-11-19 NOTE — Telephone Encounter (Signed)
Done erx 

## 2018-01-04 ENCOUNTER — Other Ambulatory Visit: Payer: Self-pay | Admitting: Internal Medicine

## 2018-04-02 ENCOUNTER — Other Ambulatory Visit (INDEPENDENT_AMBULATORY_CARE_PROVIDER_SITE_OTHER): Payer: 59

## 2018-04-02 DIAGNOSIS — Z0000 Encounter for general adult medical examination without abnormal findings: Secondary | ICD-10-CM | POA: Diagnosis not present

## 2018-04-02 DIAGNOSIS — Z114 Encounter for screening for human immunodeficiency virus [HIV]: Secondary | ICD-10-CM

## 2018-04-02 DIAGNOSIS — E119 Type 2 diabetes mellitus without complications: Secondary | ICD-10-CM | POA: Diagnosis not present

## 2018-04-02 LAB — BASIC METABOLIC PANEL
BUN: 12 mg/dL (ref 6–23)
CALCIUM: 9.2 mg/dL (ref 8.4–10.5)
CO2: 28 meq/L (ref 19–32)
CREATININE: 0.95 mg/dL (ref 0.40–1.50)
Chloride: 102 mEq/L (ref 96–112)
GFR: 85.87 mL/min (ref >60.00)
GLUCOSE: 209 mg/dL — AB (ref 70–99)
Potassium: 4.5 mEq/L (ref 3.5–5.1)
Sodium: 137 mEq/L (ref 135–145)

## 2018-04-02 LAB — CBC WITH DIFFERENTIAL/PLATELET
BASOS ABS: 0.1 10*3/uL (ref 0.0–0.1)
Basophils Relative: 1.1 % (ref 0.0–3.0)
Eosinophils Absolute: 0.2 10*3/uL (ref 0.0–0.7)
Eosinophils Relative: 3.3 % (ref 0.0–5.0)
HCT: 39.9 % (ref 39.0–52.0)
Hemoglobin: 13.9 g/dL (ref 13.0–17.0)
LYMPHS ABS: 1.2 10*3/uL (ref 0.7–4.0)
Lymphocytes Relative: 21.5 % (ref 12.0–46.0)
MCHC: 35 g/dL (ref 30.0–36.0)
MCV: 89.8 fl (ref 78.0–100.0)
Monocytes Absolute: 0.4 10*3/uL (ref 0.1–1.0)
Monocytes Relative: 6.7 % (ref 3.0–12.0)
NEUTROS PCT: 67.4 % (ref 43.0–77.0)
Neutro Abs: 3.9 10*3/uL (ref 1.4–7.7)
Platelets: 215 10*3/uL (ref 150.0–400.0)
RBC: 4.44 Mil/uL (ref 4.22–5.81)
RDW: 12.7 % (ref 11.5–15.5)
WBC: 5.8 10*3/uL (ref 4.0–10.5)

## 2018-04-02 LAB — HEMOGLOBIN A1C: Hgb A1c MFr Bld: 8.8 % — ABNORMAL HIGH (ref 4.6–6.5)

## 2018-04-02 LAB — URINALYSIS, ROUTINE W REFLEX MICROSCOPIC
Bilirubin Urine: NEGATIVE
HGB URINE DIPSTICK: NEGATIVE
KETONES UR: NEGATIVE
Leukocytes, UA: NEGATIVE
NITRITE: NEGATIVE
RBC / HPF: NONE SEEN (ref 0–?)
Specific Gravity, Urine: 1.02 (ref 1.000–1.030)
Total Protein, Urine: NEGATIVE
UROBILINOGEN UA: 0.2 (ref 0.0–1.0)
Urine Glucose: 250 — AB
pH: 6 (ref 5.0–8.0)

## 2018-04-02 LAB — LIPID PANEL
CHOLESTEROL: 145 mg/dL (ref 0–200)
HDL: 32.4 mg/dL — AB (ref >39.00)
NonHDL: 112.41
Total CHOL/HDL Ratio: 4
Triglycerides: 205 mg/dL — ABNORMAL HIGH (ref 0.0–149.0)
VLDL: 41 mg/dL — AB (ref 0.0–40.0)

## 2018-04-02 LAB — LDL CHOLESTEROL, DIRECT: Direct LDL: 89 mg/dL

## 2018-04-02 LAB — HEPATIC FUNCTION PANEL
ALBUMIN: 4.2 g/dL (ref 3.5–5.2)
ALT: 15 U/L (ref 0–53)
AST: 13 U/L (ref 0–37)
Alkaline Phosphatase: 38 U/L — ABNORMAL LOW (ref 39–117)
Bilirubin, Direct: 0.1 mg/dL (ref 0.0–0.3)
Total Bilirubin: 0.4 mg/dL (ref 0.2–1.2)
Total Protein: 6.9 g/dL (ref 6.0–8.3)

## 2018-04-02 LAB — TSH: TSH: 1.91 u[IU]/mL (ref 0.35–4.50)

## 2018-04-02 LAB — PSA: PSA: 1.18 ng/mL (ref 0.10–4.00)

## 2018-04-02 LAB — MICROALBUMIN / CREATININE URINE RATIO
Creatinine,U: 109.4 mg/dL
Microalb Creat Ratio: 0.6 mg/g (ref 0.0–30.0)
Microalb, Ur: <0.7 mg/dL (ref 0.0–1.9)

## 2018-04-03 LAB — HIV ANTIBODY (ROUTINE TESTING W REFLEX): HIV 1&2 Ab, 4th Generation: NONREACTIVE

## 2018-04-10 ENCOUNTER — Encounter: Payer: Self-pay | Admitting: Internal Medicine

## 2018-04-10 ENCOUNTER — Ambulatory Visit (INDEPENDENT_AMBULATORY_CARE_PROVIDER_SITE_OTHER): Payer: 59 | Admitting: Internal Medicine

## 2018-04-10 VITALS — BP 122/72 | HR 57 | Temp 98.5°F | Ht 69.0 in | Wt 220.0 lb

## 2018-04-10 DIAGNOSIS — E119 Type 2 diabetes mellitus without complications: Secondary | ICD-10-CM | POA: Diagnosis not present

## 2018-04-10 DIAGNOSIS — Z0000 Encounter for general adult medical examination without abnormal findings: Secondary | ICD-10-CM | POA: Diagnosis not present

## 2018-04-10 MED ORDER — EMPAGLIFLOZIN 25 MG PO TABS: 25.0000 mg | ORAL_TABLET | Freq: Every day | ORAL | 3 refills | Status: DC

## 2018-04-10 MED ORDER — ZOSTER VAC RECOMB ADJUVANTED 50 MCG/0.5ML IM SUSR: 0.5000 mL | Freq: Once | INTRAMUSCULAR | 1 refills | Status: AC

## 2018-04-10 NOTE — Assessment & Plan Note (Signed)

## 2018-04-10 NOTE — Assessment & Plan Note (Signed)
Uncontrolled mostly related to more sedentary job; for increased activity, also add jardiacne 25 qd

## 2018-04-10 NOTE — Patient Instructions (Signed)
Please take all new medication as prescribed - the jardiance  Please continue all other medications as before, and refills have been done if requested.  Please have the pharmacy call with any other refills you may need.  Please continue your efforts at being more active, low cholesterol diet, and weight control.  You are otherwise up to date with prevention measures today.  Please keep your appointments with your specialists as you may have planned  Please return in 6 months, or sooner if needed, with Lab testing done 3-5 days before  

## 2018-04-10 NOTE — Progress Notes (Signed)
Subjective:    Patient ID: Scott Valentine, male    DOB: 1956/06/15, 62 y.o.   MRN: [id number]  HPI  Here for wellness and f/u;  Overall doing ok;  Pt denies Chest pain, worsening SOB, DOE, wheezing, orthopnea, PND, worsening LE edema, palpitations, dizziness or syncope.  Pt denies neurological change such as new headache, facial or extremity weakness.  Pt denies polydipsia, polyuria, or low sugar symptoms. Pt states overall good compliance with treatment and medications, good tolerability, and has been trying to follow appropriate diet.  Pt denies worsening depressive symptoms, suicidal ideation or panic. No fever, night sweats, wt loss, loss of appetite, or other constitutional symptoms.  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 BP Readings from Last 3 Encounters:  04/10/18 122/72  10/10/17 126/84  04/11/17 138/86   Wt Readings from Last 3 Encounters:  04/10/18 220 lb (99.8 kg)  10/10/17 219 lb (99.3 kg)  04/11/17 214 lb (97.1 kg)   Past Medical History:  Diagnosis Date  . Allergy   . Anxiety state, unspecified 10/08/2007  . Cough 11/08/2009  . Dysmetabolic syndrome X 12/16/8117  . GERD 05/23/2008  . GLUCOSE INTOLERANCE 10/19/2008  . HYPERLIPIDEMIA 10/08/2007  . HYPERPLASIA PROSTATE UNS W/O UR OBST & OTH LUTS 10/08/2007  . Impaired glucose tolerance 12/30/2010  . Other and unspecified hyperlipidemia 05/01/2007  . Rectal bleeding   . 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 TOBACCO USE, QUIT 10/08/2007  . UNS ADVRS EFF UNS RX MEDICINAL&BIOLOGICAL SBSTNC 12/17/2007  . Unspecified essential hypertension 05/01/2007  . URI 10/29/2007   Past Surgical History:  Procedure Laterality Date  . ANAL FISSURE REPAIR  04/25/11  . finger surgury' Left 05/30/2008   3rd finger  . HEMORRHOID SURGERY  04/25/11    reports that he has been smoking.  He has been smoking about 1.00 pack per day. He has never used smokeless tobacco. He reports that he does not drink alcohol or use drugs. family history includes Coronary artery disease (age of onset: 37) in his father; Diabetes in his mother; Heart attack in his paternal grandfather; Heart disease in his brother and father; Lung cancer in his father; Mental illness in his mother; Prostate cancer in his paternal grandfather. Allergies  Allergen Reactions  . Atorvastatin     REACTION: myalgias  . Ezetimibe     REACTION: myalgias  . Sulfonamide Derivatives     REACTION: stevens johnson   Current Outpatient Medications on File Prior to Visit  Medication Sig Dispense Refill  . aspirin EC 81 MG tablet Take 1 tablet (81 mg total) by mouth daily. 90 tablet 11  . Blood Glucose Monitoring Suppl (ONE TOUCH ULTRA 2) w/Device KIT Use glucometer to test sugars 1-2 times daily. Dx. E11.9 1 each 1  . escitalopram (LEXAPRO) 20 MG tablet Take 1 tablet (20 mg total) by mouth daily. 90 tablet 3  . fenofibrate 160 MG tablet Take 1 tablet (160 mg total) by mouth daily. 90 tablet 3  . fexofenadine (ALLEGRA) 180 MG tablet Take 1 tablet (180 mg total) by mouth daily as needed. 90 tablet 3  . folic acid (FOLVITE) 1 MG tablet Take 1 tablet (1 mg total) by mouth daily. 90 tablet 3  . folic acid (FOLVITE) 147 MCG tablet Take 1 tablet (400 mcg total) by mouth daily.  90 tablet 3  . glipiZIDE (GLUCOTROL XL) 10 MG 24 hr tablet Take 1 tablet (10 mg total) by mouth daily with breakfast. 90 tablet 3  . glucose blood (ONE TOUCH ULTRA TEST) test strip Please use 1-2 test strips to test sugars daily. Dx. E11.9 100 each 12  . LORazepam (ATIVAN) 0.5 MG tablet TAKE 1 TABLET DAILY AS NEEDED 90 tablet 1  . losartan (COZAAR) 50 MG tablet Take 1 tablet (50 mg total) by mouth daily. 90 tablet 3  . metFORMIN (GLUCOPHAGE-XR) 500 MG 24 hr tablet Take 4 Tablets by mouth every morning 360 tablet 3  .  metoprolol tartrate (LOPRESSOR) 25 MG tablet TAKE 1/2 TABLET BY MOUTH ONCE DAILY 45 tablet 2  . Multiple Vitamin (MULTIVITAMIN) tablet Take 1 tablet by mouth daily.      . Omega 3 1200 MG CAPS Take by mouth.    Glory Rosebush DELICA LANCETS FINE MISC Please use 1-2 lancet  to test sugars daily. Dx E11.9 100 each 12  . rosuvastatin (CRESTOR) 40 MG tablet Take 1 tablet (40 mg total) by mouth daily. 90 tablet 3   No current facility-administered medications on file prior to visit.    Review of Systems Constitutional: Negative for other unusual diaphoresis, sweats, appetite or weight changes HENT: Negative for other worsening hearing loss, ear pain, facial swelling, mouth sores or neck stiffness.   Eyes: Negative for other worsening pain, redness or other visual disturbance.  Respiratory: Negative for other stridor or swelling Cardiovascular: Negative for other palpitations or other chest pain  Gastrointestinal: Negative for worsening diarrhea or loose stools, blood in stool, distention or other pain Genitourinary: Negative for hematuria, flank pain or other change in urine volume.  Musculoskeletal: Negative for myalgias or other joint swelling.  Skin: Negative for other color change, or other wound or worsening drainage.  Neurological: Negative for other syncope or numbness. Hematological: Negative for other adenopathy or swelling Psychiatric/Behavioral: Negative for hallucinations, other worsening agitation, SI, self-injury, or new decreased concentration All other system neg per pt    Objective:   Physical Exam BP 122/72 (BP Location: Left Arm, Patient Position: Sitting, Cuff Size: Normal)   Pulse (!) 57   Temp 98.5 F (36.9 C) (Oral)   Ht 5' 9" (1.753 m)   Wt 220 lb (99.8 kg)   SpO2 96%   BMI 32.49 kg/m  VS noted,  Constitutional: Pt is oriented to person, place, and time. Appears well-developed and well-nourished, in no significant distress and comfortable Head: Normocephalic and  atraumatic  Eyes: Conjunctivae and EOM are normal. Pupils are equal, round, and reactive to light Right Ear: External ear normal without discharge Left Ear: External ear normal without discharge Nose: Nose without discharge or deformity Mouth/Throat: Oropharynx is without other ulcerations and moist  Neck: Normal range of motion. Neck supple. No JVD present. No tracheal deviation present or significant neck LA or mass Cardiovascular: Normal rate, regular rhythm, normal heart sounds and intact distal pulses.   Pulmonary/Chest: WOB normal and breath sounds without rales or wheezing  Abdominal: Soft. Bowel sounds are normal. NT. No HSM  Musculoskeletal: Normal range of motion. Exhibits no edema Lymphadenopathy: Has no other cervical adenopathy.  Neurological: Pt is alert and oriented to person, place, and time. Pt has normal reflexes. No cranial nerve deficit. Motor grossly intact, Gait intact Skin: Skin is warm and dry. No rash noted or new ulcerations Psychiatric:  Has normal mood and affect. Behavior is normal without agitation No other exam findings  Lab Results  Component Value Date   WBC 5.8 04/02/2018   HGB 13.9 04/02/2018   HCT 39.9 04/02/2018   PLT 215.0 04/02/2018   GLUCOSE 209 (H) 04/02/2018   CHOL 145 04/02/2018   TRIG 205.0 (H) 04/02/2018   HDL 32.40 (L) 04/02/2018   LDLDIRECT 89.0 04/02/2018   LDLCALC 74 10/03/2017   ALT 15 04/02/2018   AST 13 04/02/2018   NA 137 04/02/2018   K 4.5 04/02/2018   CL 102 04/02/2018   CREATININE 0.95 04/02/2018   BUN 12 04/02/2018   CO2 28 04/02/2018   TSH 1.91 04/02/2018   PSA 1.18 04/02/2018   HGBA1C 8.8 (H) 04/02/2018   MICROALBUR <0.7 04/02/2018         Assessment & Plan:

## 2018-05-03 ENCOUNTER — Other Ambulatory Visit: Payer: Self-pay | Admitting: Internal Medicine

## 2018-05-09 ENCOUNTER — Other Ambulatory Visit: Payer: Self-pay | Admitting: Internal Medicine

## 2018-05-13 ENCOUNTER — Other Ambulatory Visit: Payer: Self-pay | Admitting: Internal Medicine

## 2018-05-19 ENCOUNTER — Other Ambulatory Visit: Payer: Self-pay | Admitting: Internal Medicine

## 2018-05-19 MED ORDER — LOSARTAN POTASSIUM 25 MG PO TABS: 25.0000 mg | ORAL_TABLET | Freq: Two times a day (BID) | ORAL | 3 refills | Status: DC

## 2018-06-13 ENCOUNTER — Other Ambulatory Visit: Payer: Self-pay | Admitting: Internal Medicine

## 2018-06-15 NOTE — Telephone Encounter (Signed)
Done erx 

## 2018-06-23 DIAGNOSIS — L84 Corns and callosities: Secondary | ICD-10-CM | POA: Diagnosis not present

## 2018-06-23 DIAGNOSIS — E1142 Type 2 diabetes mellitus with diabetic polyneuropathy: Secondary | ICD-10-CM | POA: Diagnosis not present

## 2018-07-13 ENCOUNTER — Ambulatory Visit (INDEPENDENT_AMBULATORY_CARE_PROVIDER_SITE_OTHER): Payer: 59

## 2018-07-13 DIAGNOSIS — Z23 Encounter for immunization: Secondary | ICD-10-CM

## 2018-08-21 ENCOUNTER — Emergency Department (HOSPITAL_COMMUNITY): Payer: 59

## 2018-08-21 ENCOUNTER — Emergency Department (HOSPITAL_COMMUNITY)
Admission: EM | Admit: 2018-08-21 | Discharge: 2018-08-21 | Disposition: A | Discharge status: Home / Self Care | Payer: 59 | Attending: Emergency Medicine | Admitting: Emergency Medicine

## 2018-08-21 ENCOUNTER — Encounter (HOSPITAL_COMMUNITY): Payer: Self-pay | Admitting: Emergency Medicine

## 2018-08-21 ENCOUNTER — Ambulatory Visit: Payer: Self-pay | Admitting: *Deleted

## 2018-08-21 DIAGNOSIS — Z7982 Long term (current) use of aspirin: Secondary | ICD-10-CM | POA: Diagnosis not present

## 2018-08-21 DIAGNOSIS — E785 Hyperlipidemia, unspecified: Secondary | ICD-10-CM | POA: Insufficient documentation

## 2018-08-21 DIAGNOSIS — Z79899 Other long term (current) drug therapy: Secondary | ICD-10-CM | POA: Insufficient documentation

## 2018-08-21 DIAGNOSIS — M542 Cervicalgia: Secondary | ICD-10-CM | POA: Diagnosis not present

## 2018-08-21 DIAGNOSIS — Z7984 Long term (current) use of oral hypoglycemic drugs: Secondary | ICD-10-CM | POA: Diagnosis not present

## 2018-08-21 DIAGNOSIS — F17200 Nicotine dependence, unspecified, uncomplicated: Secondary | ICD-10-CM | POA: Diagnosis not present

## 2018-08-21 DIAGNOSIS — R200 Anesthesia of skin: Secondary | ICD-10-CM | POA: Insufficient documentation

## 2018-08-21 LAB — BASIC METABOLIC PANEL
Anion gap: 13 (ref 5–15)
BUN: 11 mg/dL (ref 6–20)
CO2: 24 mmol/L (ref 22–32)
Calcium: 9.6 mg/dL (ref 8.9–10.3)
Chloride: 102 mmol/L (ref 98–111)
Creatinine, Ser: 1 mg/dL (ref 0.61–1.24)
GFR calc Af Amer: >60 mL/min (ref >60)
GFR calc non Af Amer: >60 mL/min (ref >60)
GLUCOSE: 78 mg/dL (ref 70–99)
Potassium: 3.9 mmol/L (ref 3.5–5.1)
Sodium: 139 mmol/L (ref 135–145)

## 2018-08-21 LAB — CBC WITH DIFFERENTIAL/PLATELET
Abs Immature Granulocytes: 0.02 10*3/uL (ref 0.00–0.07)
Basophils Absolute: 0.1 10*3/uL (ref 0.0–0.1)
Basophils Relative: 1 %
EOS ABS: 0.2 10*3/uL (ref 0.0–0.5)
Eosinophils Relative: 2 %
HCT: 47.3 % (ref 39.0–52.0)
Hemoglobin: 15.1 g/dL (ref 13.0–17.0)
Immature Granulocytes: 0 %
Lymphocytes Relative: 19 %
Lymphs Abs: 1.4 10*3/uL (ref 0.7–4.0)
MCH: 28.9 pg (ref 26.0–34.0)
MCHC: 31.9 g/dL (ref 30.0–36.0)
MCV: 90.6 fL (ref 80.0–100.0)
Monocytes Absolute: 0.5 10*3/uL (ref 0.1–1.0)
Monocytes Relative: 6 %
NEUTROS PCT: 72 %
Neutro Abs: 5.5 10*3/uL (ref 1.7–7.7)
Platelets: 246 10*3/uL (ref 150–400)
RBC: 5.22 MIL/uL (ref 4.22–5.81)
RDW: 12.5 % (ref 11.5–15.5)
WBC: 7.6 10*3/uL (ref 4.0–10.5)
nRBC: 0 % (ref 0.0–0.2)

## 2018-08-21 MED ORDER — MORPHINE SULFATE (PF) 4 MG/ML IV SOLN
4.0000 mg | Freq: Once | INTRAVENOUS | Status: AC
  Administered 2018-08-21: 4 mg via INTRAVENOUS
  Filled 2018-08-21: qty 1

## 2018-08-21 MED ORDER — GABAPENTIN 100 MG PO CAPS: 100.0000 mg | ORAL_CAPSULE | Freq: Three times a day (TID) | ORAL | 0 refills | Status: DC

## 2018-08-21 MED ORDER — MELOXICAM 7.5 MG PO TABS: 7.5000 mg | ORAL_TABLET | Freq: Every day | ORAL | 0 refills | Status: DC

## 2018-08-21 MED ORDER — OXYCODONE-ACETAMINOPHEN 5-325 MG PO TABS
1.0000 | ORAL_TABLET | Freq: Once | ORAL | Status: AC
  Administered 2018-08-21: 1 via ORAL
  Filled 2018-08-21: qty 1

## 2018-08-21 MED ORDER — CYCLOBENZAPRINE HCL 10 MG PO TABS: 5.0000 mg | ORAL_TABLET | Freq: Two times a day (BID) | ORAL | 0 refills | Status: DC | PRN

## 2018-08-21 MED ORDER — KETOROLAC TROMETHAMINE 30 MG/ML IJ SOLN
30.0000 mg | Freq: Once | INTRAMUSCULAR | Status: AC
  Administered 2018-08-21: 30 mg via INTRAVENOUS
  Filled 2018-08-21: qty 1

## 2018-08-21 MED ORDER — ONDANSETRON HCL 4 MG/2ML IJ SOLN
4.0000 mg | Freq: Once | INTRAMUSCULAR | Status: AC
  Administered 2018-08-21: 4 mg via INTRAVENOUS
  Filled 2018-08-21: qty 2

## 2018-08-21 MED ORDER — SODIUM CHLORIDE 0.9 % IV BOLUS
500.0000 mL | Freq: Once | INTRAVENOUS | Status: AC
  Administered 2018-08-21: 500 mL via INTRAVENOUS

## 2018-08-21 NOTE — ED Provider Notes (Addendum)
[facility] EMERGENCY DEPARTMENT Provider Note   CSN: [id number] Arrival date & time: 08/21/18  1030     History   Chief Complaint Chief Complaint  Patient presents with  . Neck Injury  . Arm Pain    HPI Scott Valentine is a 62 y.o. male.  Neck pain and bilateral arm numbness right greater than left since this morning.  He has associated lower neck and right shoulder discomfort.  He states all of his fingers of the right hand the tips of his left hand are tingling.  No trauma.  Severity of symptoms is moderate.  Position and palpation make numbness worse     Past Medical History:  Diagnosis Date  . Allergy   . Anxiety state, unspecified 10/08/2007  . Cough 11/08/2009  . Dysmetabolic syndrome X 7/85/8850  . GERD 05/23/2008  . GLUCOSE INTOLERANCE 10/19/2008  . HYPERLIPIDEMIA 10/08/2007  . HYPERPLASIA PROSTATE UNS W/O UR OBST & OTH LUTS 10/08/2007  . Impaired glucose tolerance 12/30/2010  . Other and unspecified hyperlipidemia 05/01/2007  . Rectal bleeding   . 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 TOBACCO USE, QUIT 10/08/2007  . UNS ADVRS EFF UNS RX MEDICINAL&BIOLOGICAL SBSTNC 12/17/2007  . Unspecified essential hypertension 05/01/2007  . URI 10/29/2007    Patient Active Problem List   Diagnosis Date Noted  . Increased prostate specific antigen (PSA) velocity 03/18/2014  . Plantar fasciitis 11/11/2012  . Decreased libido 02/07/2012  . Anal fissure 06/11/2011  . Pruritus ani 06/11/2011  . Preventative health care 01/04/2011  . Anal or rectal pain 01/04/2011  . Diabetes ([place]) 12/30/2010  . GERD 05/23/2008  . UNS ADVRS EFF UNS RX MEDICINAL&BIOLOGICAL SBSTNC 12/17/2007  . HYPERLIPIDEMIA 10/08/2007  . Dysmetabolic syndrome X 27/74/1287  . Anxiety state 10/08/2007  . HYPERPLASIA PROSTATE UNS W/O UR OBST & OTH LUTS 10/08/2007  . 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 TOBACCO USE, QUIT 10/08/2007  . Essential hypertension 05/01/2007    Past  Surgical History:  Procedure Laterality Date  . ANAL FISSURE REPAIR  04/25/11  . finger surgury' Left 05/30/2008   3rd finger  . HEMORRHOID SURGERY  04/25/11        Home Medications    Prior to Admission medications   Medication Sig Start Date End Date Taking? Authorizing Provider  aspirin EC 81 MG tablet Take 1 tablet (81 mg total) by mouth daily. 03/18/14   Biagio Borg, MD  Blood Glucose Monitoring Suppl (ONE TOUCH ULTRA 2) w/Device KIT Use glucometer to test sugars 1-2 times daily. Dx. E11.9 04/12/16   Biagio Borg, MD  empagliflozin (JARDIANCE) 25 MG TABS tablet Take 25 mg by mouth daily. 04/10/18   Biagio Borg, MD  escitalopram (LEXAPRO) 20 MG tablet TAKE 1 TABLET BY MOUTH ONCE DAILY 05/12/18   Biagio Borg, MD  fenofibrate 160 MG tablet TAKE 1 TABLET BY MOUTH ONCE DAILY 05/04/18   Biagio Borg, MD  fexofenadine (ALLEGRA) 180 MG tablet Take 1 tablet (180 mg total) by mouth daily as needed. 03/18/14   Biagio Borg, MD  folic acid (FOLVITE) 1 MG tablet Take 1 tablet (1 mg total) by mouth daily. 03/18/14   Biagio Borg, MD  folic acid (FOLVITE) 867 MCG tablet Take 1 tablet (400 mcg total) by mouth daily. 04/11/17   Biagio Borg, MD  glipiZIDE (GLUCOTROL XL) 10 MG 24 hr tablet TAKE 1 TABLET BY MOUTH ONCE DAILY WITH BREAKFAST 05/12/18   Cathlean Cower  W, MD  glucose blood (ONE TOUCH ULTRA TEST) test strip Please use 1-2 test strips to test sugars daily. Dx. E11.9 04/12/16   Biagio Borg, MD  LORazepam (ATIVAN) 0.5 MG tablet TAKE 1 TABLET BY MOUTH EVERY DAY AS NEEDED 06/15/18   Biagio Borg, MD  losartan (COZAAR) 25 MG tablet Take 1 tablet (25 mg total) by mouth 2 (two) times daily. 05/19/18   Biagio Borg, MD  metFORMIN (GLUCOPHAGE-XR) 500 MG 24 hr tablet TAKE 4 TABLETS BY MOUTH EVERY MORNING 05/12/18   Biagio Borg, MD  metoprolol tartrate (LOPRESSOR) 25 MG tablet TAKE 1/2 TABLET BY MOUTH ONCE DAILY 01/05/18   Biagio Borg, MD  Multiple Vitamin (MULTIVITAMIN) tablet Take 1 tablet by mouth daily.       [provider]  Omega 3 1200 MG CAPS Take by mouth.    [provider]  [facility] DELICA LANCETS FINE MISC Please use 1-2 lancet  to test sugars daily. Dx E11.9 04/12/16   Biagio Borg, MD  rosuvastatin (CRESTOR) 40 MG tablet TAKE 1 TABLET BY MOUTH ONCE DAILY 05/04/18   Biagio Borg, MD    Family History Family History  Problem Relation Age of Onset  . Diabetes Mother   . Mental illness Mother   . Heart disease Father        MI x 2  . Coronary artery disease Father 69  . Lung cancer Father        pneumonectomy  . Heart disease Brother        heart mumur  . Heart attack Paternal Grandfather   . Prostate cancer Paternal Grandfather   . Colon cancer Neg Hx     Social History Social History   Tobacco Use  . Smoking status: Current Every Day Smoker    Packs/day: 1.00  . Smokeless tobacco: Never Used  . Tobacco comment: Chantix  Substance Use Topics  . Alcohol use: No    Alcohol/week: 0.0 standard drinks  . Drug use: No     Allergies   Atorvastatin; Ezetimibe; and Sulfonamide derivatives   Review of Systems Review of Systems  All other systems reviewed and are negative.    Physical Exam Updated Vital Signs BP 122/71   Pulse (!) 55 Comment: Simultaneous filing. User may not have seen previous data.  Temp 98.4 F (36.9 C) (Oral)   Resp 16   SpO2 100%   Physical Exam Vitals signs and nursing note reviewed.  Constitutional:      Appearance: He is well-developed.  HENT:     Head: Normocephalic and atraumatic.  Eyes:     Conjunctiva/sclera: Conjunctivae normal.  Neck:     Musculoskeletal: Neck supple.  Cardiovascular:     Rate and Rhythm: Normal rate and regular rhythm.  Pulmonary:     Effort: Pulmonary effort is normal.     Breath sounds: Normal breath sounds.  Abdominal:     General: Bowel sounds are normal.     Palpations: Abdomen is soft.  Musculoskeletal: Normal range of motion.     Comments: Right upper extremity 3/5 strength    Skin:    General: Skin is warm and dry.  Neurological:     Mental Status: He is alert and oriented to person, place, and time.  Psychiatric:        Behavior: Behavior normal.      ED Treatments / Results  Labs (all labs ordered are listed, but only abnormal results are displayed) Labs  Reviewed  CBC WITH DIFFERENTIAL/PLATELET  BASIC METABOLIC PANEL    EKG None  Radiology No results found.  Procedures Procedures (including critical care time)  Medications Ordered in ED Medications  sodium chloride 0.9 % bolus 500 mL (0 mLs Intravenous Stopped 08/21/18 1410)  ketorolac (TORADOL) 30 MG/ML injection 30 mg (30 mg Intravenous Given 08/21/18 1306)  morphine 4 MG/ML injection 4 mg (4 mg Intravenous Given 08/21/18 1307)  ondansetron (ZOFRAN) injection 4 mg (4 mg Intravenous Given 08/21/18 1305)  morphine 4 MG/ML injection 4 mg (4 mg Intravenous Given 08/21/18 1529)     Initial Impression / Assessment and Plan / ED Course  I have reviewed the triage vital signs and the nursing notes.  Pertinent labs & imaging results that were available during my care of the patient were reviewed by me and considered in my medical decision making (see chart for details).     Patient presents with bilateral arm numbness right greater than left with associated right arm weakness.  Will obtain MRI of cervical spine. Disc c Dr Ralene Bathe  Final Clinical Impressions(s) / ED Diagnoses   Final diagnoses:  Right arm numbness    ED Discharge Orders    None       Nat Christen, MD 08/21/18 1606    Nat Christen, MD 08/21/18 1626

## 2018-08-21 NOTE — ED Notes (Signed)
Patient verbalizes understanding of discharge instructions. Opportunity for questioning and answers were provided. Armband removed by staff, pt discharged from ED ambulatory w/ wife 

## 2018-08-21 NOTE — ED Notes (Signed)
MRI called and will be coming for pt.

## 2018-08-21 NOTE — ED Notes (Signed)
Pt states he has had right neck, shoulder, and arm pain since Monday He states right hand feels numb. He also states he feels tingling in left fingers.

## 2018-08-21 NOTE — ED Notes (Signed)
Patient transported to MRI 

## 2018-08-21 NOTE — ED Provider Notes (Signed)
Patient care assumed at 1600. Patient here for evaluation of four days of neck pain and paresthesias. MRI does note some spinal stenosis. He does have pain on examination, five out of five strength in all four extremities. Presentation is not consistent with epidural abscess, referred cardiac or intra-abdominal pain. Discussed with Dr. Venetia Constable with neurosurgery findings of MRI. Given patient is a poorly controlled diabetic, will hold steroids at this time. Plan to discharge home with anti-inflammatories, gabapentin, muscle relaxants and close outpatient follow-up as well as return precautions.   Quintella Reichert, MD 08/21/18 [phone]

## 2018-08-21 NOTE — ED Triage Notes (Signed)
Pt here from home with c/o neck pain and arm pain and numbness times 1 week , pt states that he has been under a lot stress f or about 3 weeks

## 2018-08-21 NOTE — Telephone Encounter (Signed)
   Reason for Disposition . [1] Numbness (i.e., loss of sensation) of the face, arm / hand, or leg / foot on one side of the body AND [2] sudden onset AND [3] present now    Patient is calling to report that he has been experiencing pain in R neck, shoulder,arm- into the hand- with numbness and tingling in the finger tips. Patient has been having to use the other hand tohelp pick up the arm to work. Patient states symptoms started this week and have continued and not gotten better. Patient advise due to the involvement of the entire arm/hand we have to have him evaluated at [facility]- he agrees to go.  Answer Assessment - Initial Assessment Questions 1. SYMPTOM: "What is the main symptom you are concerned about?" (e.g., weakness, numbness)     Pain in back of neck, shoulder, arm- R , tips of fingers are numb and hand is numb and tingling 2. ONSET: "When did this start?" (minutes, hours, days; while sleeping)     Monday 3. LAST NORMAL: "When was the last time you were normal (no symptoms)?"     Sunday 4. PATTERN "Does this come and go, or has it been constant since it started?"  "Is it present now?"     Constant- present now- patient is having trouble sleeping 5. CARDIAC SYMPTOMS: "Have you had any of the following symptoms: chest pain, difficulty breathing, palpitations?"     no 6. NEUROLOGIC SYMPTOMS: "Have you had any of the following symptoms: headache, dizziness, vision loss, double vision, changes in speech, unsteady on your feet?"     Increased stress 7. OTHER SYMPTOMS: "Do you have any other symptoms?"     no 8. PREGNANCY: "Is there any chance you are pregnant?" "When was your last menstrual period?"     n/a  Protocols used: NEUROLOGIC DEFICIT-A-AH

## 2018-08-24 ENCOUNTER — Ambulatory Visit: Payer: 59 | Admitting: Nurse Practitioner

## 2018-08-25 ENCOUNTER — Other Ambulatory Visit: Payer: Self-pay | Admitting: Internal Medicine

## 2018-08-25 ENCOUNTER — Other Ambulatory Visit: Payer: Self-pay | Admitting: Neurological Surgery

## 2018-08-25 ENCOUNTER — Telehealth: Payer: Self-pay

## 2018-08-25 DIAGNOSIS — M5412 Radiculopathy, cervical region: Secondary | ICD-10-CM | POA: Diagnosis not present

## 2018-08-25 MED ORDER — HYDROCODONE-ACETAMINOPHEN 5-325 MG PO TABS: 1.0000 | ORAL_TABLET | Freq: Four times a day (QID) | ORAL | 0 refills | Status: DC | PRN

## 2018-08-25 NOTE — Telephone Encounter (Signed)
Was this the nature of the conversation what the neurosurgeon earlier today? Please advise,  Copied from [place] [id number]. Topic: General - Other >> Aug 25, 2018 12:28 PM Valla Leaver wrote: Reason for CRM: Patient's wife calling to make sure Dr. Jenny Reichmann is aware of the patient's healthcare instructions under his neurologist and whether or not Dr. Jenny Reichmann received the medical notes from the neurologist? Says Dr. Jenny Reichmann needs to pick up prednisone and pain medicine scripts. Would like a call back from Dr. Jenny Reichmann cma.

## 2018-08-25 NOTE — Telephone Encounter (Signed)
No, between the NS provider and myself, it was felt that the steroid would exacerbate his sugars.  Therefore he was prescribed pain medication from my office in order to make to the appt for the steroid injection soon (I think next week).  thanks

## 2018-08-25 NOTE — Telephone Encounter (Signed)
Spoke to provider at [facility] seeing this pt this am, and wants to avoid steroid tx due to elevated sugars on multiple OHA  OK for hydrocodone 5 prn - done erx

## 2018-08-26 NOTE — Telephone Encounter (Signed)
Pt called back and was informed. He also stated that he would have FMLA paperwork faxed over.

## 2018-08-26 NOTE — Telephone Encounter (Signed)
Called pt, LVM.   

## 2018-08-27 ENCOUNTER — Other Ambulatory Visit: Payer: Self-pay | Admitting: Internal Medicine

## 2018-08-27 ENCOUNTER — Telehealth: Payer: Self-pay | Admitting: Internal Medicine

## 2018-08-27 MED ORDER — GABAPENTIN 100 MG PO CAPS: 100.0000 mg | ORAL_CAPSULE | Freq: Three times a day (TID) | ORAL | 0 refills | Status: DC

## 2018-08-27 MED ORDER — CYCLOBENZAPRINE HCL 10 MG PO TABS: 5.0000 mg | ORAL_TABLET | Freq: Two times a day (BID) | ORAL | 0 refills | Status: DC | PRN

## 2018-08-27 MED ORDER — MELOXICAM 7.5 MG PO TABS: 7.5000 mg | ORAL_TABLET | Freq: Every day | ORAL | 0 refills | Status: DC

## 2018-08-27 NOTE — Telephone Encounter (Signed)
Spoke with patient's wife. She said they need the forms because the the patient has been out of work until 08/21/18 and he does not see the doctor about the injection until 13/30/19. So we can write him out until they see him or how ever you would like to do this. I did set up an appointment for the patient on Monday for the hospital fu. Please advise if appointment needs to be canceled or how you would like to do this. Patients wife is asking about pain medication and such as well. I did feel like the appointment was needed. Thank you.

## 2018-08-27 NOTE — Telephone Encounter (Signed)
Copied from [place] [id number]. Topic: Quick Communication - Rx Refill/Question >> Aug 27, 2018 11:10 AM Bea Graff, NT wrote: Medication: meloxicam (MOBIC) 7.5 MG tablet, gabapentin (NEURONTIN) 100 MG capsule, cyclobenzaprine (FLEXERIL) 10 MG tablet  Pts wife is not sure if the dr wants him to stay on all of these? Was prescribed by the ER. She states the gabapentin he is out of as of this morning.  Has the patient contacted their pharmacy? Yes.   (Agent: If no, request that the patient contact the pharmacy for the refill.) (Agent: If yes, when and what did the pharmacy advise?)  Preferred Pharmacy (with phone number or street name): [facility], [address] [phone] (Phone) [phone] (Fax)    Agent: Please be advised that RX refills may take up to 3 business days. We ask that you follow-up with your pharmacy.

## 2018-08-27 NOTE — Telephone Encounter (Signed)
I received FMLA forms via fax for patient.   Scott Valentine seems to think they may have something to do with a surgery. I have called patient and LVM to call back about the forms.   If it is for a surgery why isn't the surgeon doing the forms? What is the surgery? What are the dates? I do not have any of this information.   Dr.John are you aware of any of this and are you okay with completing these forms if we get the information?

## 2018-08-27 NOTE — Telephone Encounter (Signed)
Ok, will see at [place], thanks

## 2018-08-27 NOTE — Telephone Encounter (Signed)
The Patient wife was seen on 08/21/18. They did a MRI he had a herniated disk in his neck. He is not going to have the procedure until 09/07/18.  The neurosurgeon can not completed the paper work because Scott Valentine is not a patient of his yet since he has not had the surgery yet.  He directed him to his PCP Dr. Jenny Reichmann.   Please advise.

## 2018-08-27 NOTE — Telephone Encounter (Signed)
Informed.

## 2018-08-27 NOTE — Telephone Encounter (Signed)
Please review for refills. See telephone encounter.  Prescribed by a historical provider (ED physician).

## 2018-08-27 NOTE — Telephone Encounter (Signed)
rx done erx 

## 2018-08-31 ENCOUNTER — Encounter: Payer: Self-pay | Admitting: Internal Medicine

## 2018-08-31 ENCOUNTER — Ambulatory Visit: Payer: 59 | Admitting: Internal Medicine

## 2018-08-31 VITALS — BP 118/74 | HR 69 | Temp 97.8°F | Ht 69.0 in | Wt 204.0 lb

## 2018-08-31 DIAGNOSIS — M5020 Other cervical disc displacement, unspecified cervical region: Secondary | ICD-10-CM | POA: Insufficient documentation

## 2018-08-31 DIAGNOSIS — M4802 Spinal stenosis, cervical region: Secondary | ICD-10-CM | POA: Insufficient documentation

## 2018-08-31 DIAGNOSIS — I10 Essential (primary) hypertension: Secondary | ICD-10-CM | POA: Diagnosis not present

## 2018-08-31 MED ORDER — CYCLOBENZAPRINE HCL 10 MG PO TABS: 5.0000 mg | ORAL_TABLET | Freq: Two times a day (BID) | ORAL | 2 refills | Status: DC | PRN

## 2018-08-31 MED ORDER — HYDROCODONE-ACETAMINOPHEN 5-325 MG PO TABS: 1.0000 | ORAL_TABLET | Freq: Four times a day (QID) | ORAL | 0 refills | Status: DC | PRN

## 2018-08-31 NOTE — Progress Notes (Signed)
Subjective:    Patient ID: Scott Valentine, male    DOB: 07-29-1956, 62 y.o.   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   Pain started Dec 9 and could not work, seen at ED dec 13 with MRI c/w IMPRESSION: 1. Markedly motion degraded examination. 2. C6-7 right subarticular disc extrusion that narrows the ventral thecal sac and indents the anterior spinal cord, causing mild spinal canal stenosis. 3. Multilevel mild-to-moderate neural foraminal narrowing. Saw Dr Zada Finders in referral from the ED on Dec 17, and per wife condition is nonsurgical and set up for ESI dec 30.  Wife is under impression he was to follow up prn.  Today has persistent pain essentially no change, out of pain medication, needs FMLA and ST disability forms filled out.  States he was told by NS to expect to be out of work for 6 wks.  Pt without bowel or bladder change, fever, wt loss,  worsening LE pain/numbness/weakness, gait change or falls.  Pt denies chest pain, increased sob or doe, wheezing, orthopnea, PND, increased LE swelling, palpitations, dizziness or syncope.   Pt denies polydipsia, polyuria Past Medical History:  Diagnosis Date  . Allergy   . Anxiety state, unspecified 10/08/2007  . Cough 11/08/2009  . Dysmetabolic syndrome X 10/17/221  . GERD 05/23/2008  . GLUCOSE INTOLERANCE 10/19/2008  . HYPERLIPIDEMIA 10/08/2007  . HYPERPLASIA PROSTATE UNS W/O UR OBST & OTH LUTS 10/08/2007  . Impaired glucose tolerance 12/30/2010  . Other and unspecified hyperlipidemia 05/01/2007  . Rectal bleeding   . 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 TOBACCO USE, QUIT 10/08/2007  . UNS ADVRS EFF UNS RX MEDICINAL&BIOLOGICAL SBSTNC 12/17/2007  .  Unspecified essential hypertension 05/01/2007  . URI 10/29/2007   Past Surgical History:  Procedure Laterality Date  . ANAL FISSURE REPAIR  04/25/11  . finger surgury' Left 05/30/2008   3rd finger  . HEMORRHOID SURGERY  04/25/11    reports that he has been smoking. He has been smoking about 1.00 pack per day. He has never used smokeless tobacco. He reports that he does not drink alcohol or use drugs. family history includes Coronary artery disease (age of onset: 67) in his father; Diabetes in his mother; Heart attack in his paternal grandfather; Heart disease in his brother and father; Lung cancer in his father; Mental illness in his mother; Prostate cancer in his paternal grandfather. Allergies  Allergen Reactions  . Atorvastatin Other (See Comments)    myalgias  . Ezetimibe Other (See Comments)    myalgias  . Sulfonamide Derivatives Other (See Comments)    stevens johnson   Current Outpatient Medications on File Prior to Visit  Medication Sig Dispense Refill  . aspirin EC 81 MG tablet Take 1 tablet (81 mg total) by mouth daily. 90 tablet 11  . Blood Glucose Monitoring Suppl (ONE TOUCH ULTRA 2) w/Device KIT Use glucometer to test sugars 1-2 times daily. Dx. E11.9 1 each 1  . empagliflozin (JARDIANCE) 25 MG TABS tablet Take 25 mg by mouth daily. 90 tablet 3  . escitalopram (LEXAPRO) 20 MG tablet TAKE 1 TABLET BY MOUTH ONCE DAILY (Patient taking differently: Take 20 mg by mouth daily. ) 30 tablet 11  .  fenofibrate 160 MG tablet TAKE 1 TABLET BY MOUTH ONCE DAILY (Patient taking differently: Take 160 mg by mouth daily. ) 30 tablet 11  . fexofenadine (ALLEGRA) 180 MG tablet Take 1 tablet (180 mg total) by mouth daily as needed. (Patient taking differently: Take 180 mg by mouth daily. ) 90 tablet 3  . folic acid (FOLVITE) 1 MG tablet Take 1 tablet (1 mg total) by mouth daily. 90 tablet 3  . folic acid (FOLVITE) 132 MCG tablet Take 1 tablet (400 mcg total) by mouth daily. 90 tablet 3  .  gabapentin (NEURONTIN) 100 MG capsule Take 1 capsule (100 mg total) by mouth 3 (three) times daily. 90 capsule 0  . glipiZIDE (GLUCOTROL XL) 10 MG 24 hr tablet TAKE 1 TABLET BY MOUTH ONCE DAILY WITH BREAKFAST (Patient taking differently: Take 10 mg by mouth daily with breakfast. ) 30 tablet 11  . glucose blood (ONE TOUCH ULTRA TEST) test strip Please use 1-2 test strips to test sugars daily. Dx. E11.9 100 each 12  . LORazepam (ATIVAN) 0.5 MG tablet TAKE 1 TABLET BY MOUTH EVERY DAY AS NEEDED (Patient taking differently: Take 0.5 mg by mouth See admin instructions. Take one tablet (0.64m) in the morning and one tablet in the evening as needed for anxiety.) 90 tablet 1  . losartan (COZAAR) 25 MG tablet Take 1 tablet (25 mg total) by mouth 2 (two) times daily. (Patient taking differently: Take 50 mg by mouth daily. ) 180 tablet 3  . magnesium oxide (MAG-OX) 400 MG tablet Take 400 mg by mouth daily.    . meloxicam (MOBIC) 7.5 MG tablet Take 1 tablet (7.5 mg total) by mouth daily. 30 tablet 0  . metFORMIN (GLUCOPHAGE-XR) 500 MG 24 hr tablet TAKE 4 TABLETS BY MOUTH EVERY MORNING (Patient taking differently: Take 2,000 mg by mouth daily with breakfast. ) 120 tablet 11  . metoprolol tartrate (LOPRESSOR) 25 MG tablet TAKE 1/2 TABLET BY MOUTH ONCE DAILY (Patient taking differently: Take 12.5 mg by mouth daily. ) 45 tablet 2  . Multiple Vitamin (MULTIVITAMIN) tablet Take 1 tablet by mouth daily.      .Glory RosebushDELICA LANCETS FINE MISC Please use 1-2 lancet  to test sugars daily. Dx E11.9 100 each 12  . rosuvastatin (CRESTOR) 40 MG tablet TAKE 1 TABLET BY MOUTH ONCE DAILY (Patient taking differently: Take 40 mg by mouth at bedtime. ) 30 tablet 11  . vitamin C (ASCORBIC ACID) 500 MG tablet Take 500 mg by mouth daily.     No current facility-administered medications on file prior to visit.    Review of Systems  Constitutional: Negative for other unusual diaphoresis or sweats HENT: Negative for ear discharge or  swelling Eyes: Negative for other worsening visual disturbances Respiratory: Negative for stridor or other swelling  Gastrointestinal: Negative for worsening distension or other blood Genitourinary: Negative for retention or other urinary change Musculoskeletal: Negative for other MSK pain or swelling Skin: Negative for color change or other new lesions Neurological: Negative for worsening tremors and other numbness  Psychiatric/Behavioral: Negative for worsening agitation or other fatigue All other system neg per pt    Objective:   Physical Exam BP 118/74   Pulse 69   Temp 97.8 F (36.6 C) (Oral)   Ht 5' 9" (1.753 m)   Wt 204 lb (92.5 kg)   SpO2 94%   BMI 30.13 kg/m  VS noted,  Constitutional: Pt appears in NAD HENT: Head: NCAT.  Right Ear: External ear normal.  Left Ear: External ear normal.  Eyes: . Pupils are equal, round, and reactive to light. Conjunctivae and EOM are normal Nose: without d/c or deformity Neck: Neck supple. Gross normal ROM Cardiovascular: Normal rate and regular rhythm.   Pulmonary/Chest: Effort normal and breath sounds without rales or wheezing.  Abd:  Soft, NT, ND, + BS, no organomegaly Spine with mild to mod tender midline low cervical, without swelling or rash Neurological: Pt is alert. At baseline orientation, motor grossly intact Skin: Skin is warm. No rashes, other new lesions, no LE edema Psychiatric: Pt behavior is normal without agitation  No other exam findings Lab Results  Component Value Date   WBC 7.6 08/21/2018   HGB 15.1 08/21/2018   HCT 47.3 08/21/2018   PLT 246 08/21/2018   GLUCOSE 78 08/21/2018   CHOL 145 04/02/2018   TRIG 205.0 (H) 04/02/2018   HDL 32.40 (L) 04/02/2018   LDLDIRECT 89.0 04/02/2018   LDLCALC 74 10/03/2017   ALT 15 04/02/2018   AST 13 04/02/2018   NA 139 08/21/2018   K 3.9 08/21/2018   CL 102 08/21/2018   CREATININE 1.00 08/21/2018   BUN 11 08/21/2018   CO2 24 08/21/2018   TSH 1.91 04/02/2018   PSA  1.18 04/02/2018   HGBA1C 8.8 (H) 04/02/2018   MICROALBUR <0.7 04/02/2018       Assessment & Plan:

## 2018-08-31 NOTE — Patient Instructions (Signed)
Please continue all other medications as before, and refills have been done if requested - the flexeril, and the hydrocodone  Please have the pharmacy call with any other refills you may need.  Please continue your efforts at being more active, low cholesterol diet, and weight control.  Please keep your appointments with your specialists as you may have planned - the injection for next week  Your FMLA form and AFLAC will be filled out asap  Please return in 1 month, or sooner if needed

## 2018-09-01 NOTE — Assessment & Plan Note (Signed)
Consider further surgical f/u if symptoms not improved or worsen

## 2018-09-01 NOTE — Assessment & Plan Note (Signed)
stable overall by history and exam, recent data reviewed with pt, and pt to continue medical treatment as before,  to f/u any worsening symptoms or concerns  

## 2018-09-01 NOTE — Assessment & Plan Note (Addendum)
New onset, remains symptomatic, for flexeril prn trial, refill pain control, cont with ESI planned dec 30, and forms to be filled out with out of work from Dec 9 until a return to work date of Oct 06, 2018, to f/u here about Jan 27 if not improved  Note:  Total time for pt hx, exam, review of record with pt in the room, determination of diagnoses and plan for further eval and tx is > 40 min, with over 50% spent in coordination and counseling of patient including the differential dx, tx, further evaluation and other management of cervical disc herniation and cervical stenosis, HTN

## 2018-09-03 NOTE — Telephone Encounter (Signed)
Patient calling to check status of FMLA paperwork. Patient states this is urgent and is requesting a call back as soon as possible. Please advise.

## 2018-09-04 NOTE — Telephone Encounter (Signed)
Patient wife called and the patient has been out of work since the 16th and he is  having a procedure done on Monday, the FMLA is due on the 30th but his work is pressuring him to get the forms in, please fill out and fax on Monday

## 2018-09-07 ENCOUNTER — Ambulatory Visit
Admission: RE | Admit: 2018-09-07 | Discharge: 2018-09-07 | Disposition: A | Discharge status: Home / Self Care | Payer: 59 | Source: Ambulatory Visit | Attending: Neurological Surgery | Admitting: Neurological Surgery

## 2018-09-07 DIAGNOSIS — M5412 Radiculopathy, cervical region: Secondary | ICD-10-CM | POA: Diagnosis not present

## 2018-09-07 DIAGNOSIS — Z0279 Encounter for issue of other medical certificate: Secondary | ICD-10-CM

## 2018-09-07 MED ORDER — IOPAMIDOL (ISOVUE-M 300) INJECTION 61%
1.0000 mL | Freq: Once | INTRAMUSCULAR | Status: AC | PRN
  Administered 2018-09-07: 1 mL via EPIDURAL

## 2018-09-07 MED ORDER — TRIAMCINOLONE ACETONIDE 40 MG/ML IJ SUSP (RADIOLOGY)
60.0000 mg | Freq: Once | INTRAMUSCULAR | Status: AC
  Administered 2018-09-07: 60 mg via EPIDURAL

## 2018-09-07 NOTE — Telephone Encounter (Signed)
I have been out of office. Forms have been completed & faxed to [place]. Copy sent to [employer] for.   Patient has been informed. Original has been mailed to patient.

## 2018-09-07 NOTE — Discharge Instructions (Signed)

## 2018-09-09 DIAGNOSIS — I82409 Acute embolism and thrombosis of unspecified deep veins of unspecified lower extremity: Secondary | ICD-10-CM

## 2018-09-09 HISTORY — DX: Acute embolism and thrombosis of unspecified deep veins of unspecified lower extremity: I82.409

## 2018-09-10 ENCOUNTER — Telehealth: Payer: Self-pay | Admitting: Internal Medicine

## 2018-09-10 DIAGNOSIS — M542 Cervicalgia: Secondary | ICD-10-CM

## 2018-09-10 NOTE — Telephone Encounter (Signed)
Copied from [place] [id number]. Topic: Referral - Request for Referral >> Sep 10, 2018  4:11 PM Leward Quan A wrote: Has patient seen PCP for this complaint? Yes.   *If NO, is insurance requiring patient see PCP for this issue before PCP can refer them? Referral for which specialty: [facility] for Spinal injection Preferred provider/office: Sigmund Hazel Reason for referral: Herniated disk in neck region  Per patient he had a shot done on 09/07/18 and was told if the pain did not subside he was to come back in five days but to contact Dr Jenny Reichmann for a referral

## 2018-09-11 NOTE — Addendum Note (Signed)
Addended by: Biagio Borg on: 09/11/2018 12:29 PM   Modules accepted: Orders

## 2018-09-11 NOTE — Telephone Encounter (Signed)
Ok this is done 

## 2018-09-14 ENCOUNTER — Other Ambulatory Visit: Payer: Self-pay | Admitting: Neurological Surgery

## 2018-09-14 DIAGNOSIS — M5412 Radiculopathy, cervical region: Secondary | ICD-10-CM

## 2018-09-17 ENCOUNTER — Other Ambulatory Visit: Payer: 59

## 2018-09-21 ENCOUNTER — Other Ambulatory Visit: Payer: 59

## 2018-09-21 ENCOUNTER — Ambulatory Visit
Admission: RE | Admit: 2018-09-21 | Discharge: 2018-09-21 | Disposition: A | Discharge status: Home / Self Care | Payer: 59 | Source: Ambulatory Visit | Attending: Neurological Surgery | Admitting: Neurological Surgery

## 2018-09-21 DIAGNOSIS — M5412 Radiculopathy, cervical region: Secondary | ICD-10-CM

## 2018-09-21 MED ORDER — TRIAMCINOLONE ACETONIDE 40 MG/ML IJ SUSP (RADIOLOGY)
60.0000 mg | Freq: Once | INTRAMUSCULAR | Status: AC
  Administered 2018-09-21: 60 mg via EPIDURAL

## 2018-09-21 MED ORDER — IOPAMIDOL (ISOVUE-M 300) INJECTION 61%
1.0000 mL | Freq: Once | INTRAMUSCULAR | Status: AC | PRN
  Administered 2018-09-21: 1 mL via EPIDURAL

## 2018-09-21 NOTE — Discharge Instructions (Signed)

## 2018-09-25 ENCOUNTER — Ambulatory Visit: Payer: 59 | Admitting: Internal Medicine

## 2018-09-25 ENCOUNTER — Encounter: Payer: Self-pay | Admitting: Internal Medicine

## 2018-09-25 VITALS — BP 122/72 | HR 76 | Temp 98.7°F | Ht 69.0 in | Wt 199.0 lb

## 2018-09-25 DIAGNOSIS — M5020 Other cervical disc displacement, unspecified cervical region: Secondary | ICD-10-CM

## 2018-09-25 DIAGNOSIS — I10 Essential (primary) hypertension: Secondary | ICD-10-CM | POA: Diagnosis not present

## 2018-09-25 DIAGNOSIS — M4802 Spinal stenosis, cervical region: Secondary | ICD-10-CM

## 2018-09-25 DIAGNOSIS — M5412 Radiculopathy, cervical region: Secondary | ICD-10-CM

## 2018-09-25 DIAGNOSIS — E119 Type 2 diabetes mellitus without complications: Secondary | ICD-10-CM

## 2018-09-25 MED ORDER — MELOXICAM 7.5 MG PO TABS: 7.5000 mg | ORAL_TABLET | Freq: Every day | ORAL | 5 refills | Status: DC

## 2018-09-25 MED ORDER — GABAPENTIN 100 MG PO CAPS: 100.0000 mg | ORAL_CAPSULE | Freq: Three times a day (TID) | ORAL | 5 refills | Status: DC

## 2018-09-25 MED ORDER — TRAMADOL HCL 50 MG PO TABS: 50.0000 mg | ORAL_TABLET | Freq: Four times a day (QID) | ORAL | 0 refills | Status: DC | PRN

## 2018-09-25 MED ORDER — CYCLOBENZAPRINE HCL 10 MG PO TABS: 5.0000 mg | ORAL_TABLET | Freq: Two times a day (BID) | ORAL | 2 refills | Status: DC | PRN

## 2018-09-25 NOTE — Progress Notes (Signed)
Subjective:    Patient ID: Scott Valentine, male    DOB: [birth year], 63 y.o.   MRN: [id number]  HPI  Here to f/u with wife; c/o persistent moderate to severe post neck pain, sharp and dull, constant, with some radiation to the right > left shoulders, worse to move the head and neck in any direction but mostly horizontal, but denies bowel or bladder change, fever, wt loss,  worsening extremity pain/weakness, gait change or falls.  Had recent MRI c spine with spinal stenosis disc herniation, and multilevel arthritic changes with potential neuroforaminal involvement.  Now s/p esi x 2 to neck, helped quite a bit for short times, but not resolved, now back worse than ever. Does mention Numbness to most of right hand and left hand, pressure to post neck just seems to be getting worse.  Has been out of work with FMLA  But will needs new FMLA for after jan 28, as does not appear he will be able by then.  Wife suggests referral to Dr Elsner/NS as heard good things.  Current meds including mobic and gabapentin not working well, does not want to take the hydrocodone as gets loopy with it.   Past Medical History:  Diagnosis Date  . Allergy   . Anxiety state, unspecified 10/08/2007  . Cough 11/08/2009  . Dysmetabolic syndrome X 01/17/2584  . GERD 05/23/2008  . GLUCOSE INTOLERANCE 10/19/2008  . HYPERLIPIDEMIA 10/08/2007  . HYPERPLASIA PROSTATE UNS W/O UR OBST & OTH LUTS 10/08/2007  . Impaired glucose tolerance 12/30/2010  . Other and unspecified hyperlipidemia 05/01/2007  . Rectal bleeding   . 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 TOBACCO USE, QUIT 10/08/2007  . UNS ADVRS EFF UNS RX MEDICINAL&BIOLOGICAL SBSTNC 12/17/2007  . Unspecified essential hypertension 05/01/2007  . URI 10/29/2007   Past Surgical History:  Procedure Laterality Date  . ANAL FISSURE REPAIR  04/25/11  . finger surgury' Left 05/30/2008   3rd finger  . HEMORRHOID SURGERY  04/25/11    reports that he has been smoking. He has been smoking  about 1.00 pack per day. He has never used smokeless tobacco. He reports that he does not drink alcohol or use drugs. family history includes Coronary artery disease (age of onset: 68) in his father; Diabetes in his mother; Heart attack in his paternal grandfather; Heart disease in his brother and father; Lung cancer in his father; Mental illness in his mother; Prostate cancer in his paternal grandfather. Allergies  Allergen Reactions  . Sulfonamide Derivatives Other (See Comments)    stevens johnson  . Atorvastatin Other (See Comments)    myalgias  . Ezetimibe Other (See Comments)    myalgias   Current Outpatient Medications on File Prior to Visit  Medication Sig Dispense Refill  . aspirin EC 81 MG tablet Take 1 tablet (81 mg total) by mouth daily. 90 tablet 11  . empagliflozin (JARDIANCE) 25 MG TABS tablet Take 25 mg by mouth daily. 90 tablet 3  . escitalopram (LEXAPRO) 20 MG tablet TAKE 1 TABLET BY MOUTH ONCE DAILY (Patient taking differently: Take 20 mg by mouth daily. ) 30 tablet 11  . fenofibrate 160 MG tablet TAKE 1 TABLET BY MOUTH ONCE DAILY (Patient taking differently: Take 160 mg by mouth daily. ) 30 tablet 11  . fexofenadine (ALLEGRA) 180 MG tablet Take 1 tablet (180 mg total) by mouth daily as needed. (Patient taking differently: Take 180 mg by mouth daily. ) 90 tablet 3  . folic acid (FOLVITE) 1  MG tablet Take 1 tablet (1 mg total) by mouth daily. 90 tablet 3  . glipiZIDE (GLUCOTROL XL) 10 MG 24 hr tablet TAKE 1 TABLET BY MOUTH ONCE DAILY WITH BREAKFAST (Patient taking differently: Take 10 mg by mouth daily with breakfast. ) 30 tablet 11  . HYDROcodone-acetaminophen (NORCO/VICODIN) 5-325 MG tablet Take 1 tablet by mouth every 6 (six) hours as needed for moderate pain. 120 tablet 0  . LORazepam (ATIVAN) 0.5 MG tablet TAKE 1 TABLET BY MOUTH EVERY DAY AS NEEDED (Patient taking differently: Take 0.5 mg by mouth See admin instructions. Take one tablet (0.5mg ) in the morning and one  tablet in the evening as needed for anxiety.) 90 tablet 1  . losartan (COZAAR) 25 MG tablet Take 1 tablet (25 mg total) by mouth 2 (two) times daily. (Patient taking differently: Take 50 mg by mouth daily. ) 180 tablet 3  . magnesium oxide (MAG-OX) 400 MG tablet Take 400 mg by mouth daily.    . metFORMIN (GLUCOPHAGE-XR) 500 MG 24 hr tablet TAKE 4 TABLETS BY MOUTH EVERY MORNING (Patient taking differently: Take 2,000 mg by mouth daily with breakfast. ) 120 tablet 11  . metoprolol tartrate (LOPRESSOR) 25 MG tablet TAKE 1/2 TABLET BY MOUTH ONCE DAILY (Patient taking differently: Take 12.5 mg by mouth daily. ) 45 tablet 2  . Multiple Vitamin (MULTIVITAMIN) tablet Take 1 tablet by mouth daily.      . rosuvastatin (CRESTOR) 40 MG tablet TAKE 1 TABLET BY MOUTH ONCE DAILY (Patient taking differently: Take 40 mg by mouth at bedtime. ) 30 tablet 11  . vitamin C (ASCORBIC ACID) 500 MG tablet Take 500 mg by mouth daily.     No current facility-administered medications on file prior to visit.    Review of Systems  Constitutional: Negative for other unusual diaphoresis or sweats HENT: Negative for ear discharge or swelling Eyes: Negative for other worsening visual disturbances Respiratory: Negative for stridor or other swelling  Gastrointestinal: Negative for worsening distension or other blood Genitourinary: Negative for retention or other urinary change Musculoskeletal: Negative for other MSK pain or swelling Skin: Negative for color change or other new lesions Neurological: Negative for worsening tremors and other numbness  Psychiatric/Behavioral: Negative for worsening agitation or other fatigue All other system neg per pt    Objective:   Physical Exam BP 122/72   Pulse 76   Temp 98.7 F (37.1 C) (Oral)   Ht 5\' 9"  (1.753 m)   Wt 199 lb (90.3 kg)   SpO2 95%   BMI 29.39 kg/m  VS noted, non toxic but in pain Constitutional: Pt appears in NAD HENT: Head: NCAT.  Right Ear: External ear normal.   Left Ear: External ear normal.  Eyes: . Pupils are equal, round, and reactive to light. Conjunctivae and EOM are normal Nose: without d/c or deformity Neck: Neck supple. Gross decreased ROM in all directions with pain Cardiovascular: Normal rate and regular rhythm.   Pulmonary/Chest: Effort normal and breath sounds without rales or wheezing.  Neurological: Pt is alert. At baseline orientation, motor 5/5 intact Skin: Skin is warm. No rashes, other new lesions, no LE edema Psychiatric: Pt behavior is normal without agitation  No other exam findings Lab Results  Component Value Date   WBC 7.6 08/21/2018   HGB 15.1 08/21/2018   HCT 47.3 08/21/2018   PLT 246 08/21/2018   GLUCOSE 78 08/21/2018   CHOL 145 04/02/2018   TRIG 205.0 (H) 04/02/2018   HDL 32.40 (L) 04/02/2018  LDLDIRECT 89.0 04/02/2018   LDLCALC 74 10/03/2017   ALT 15 04/02/2018   AST 13 04/02/2018   NA 139 08/21/2018   K 3.9 08/21/2018   CL 102 08/21/2018   CREATININE 1.00 08/21/2018   BUN 11 08/21/2018   CO2 24 08/21/2018   TSH 1.91 04/02/2018   PSA 1.18 04/02/2018   HGBA1C 8.8 (H) 04/02/2018   MICROALBUR <0.7 04/02/2018          Assessment & Plan:

## 2018-09-25 NOTE — Patient Instructions (Signed)
Please take all new medication as prescribed  - the tramadol as needed for pain  Please continue all other medications as before, including the mobic, flexeril and gabapentin  You will be contacted regarding the referral for: Neurosurgury - Dr Gwenlyn Saran are given the work note today  Please drop off a new FMLA form when you can  Please have the pharmacy call with any other refills you may need.  Please continue your efforts at being more active, low cholesterol diet, and weight control.  Please keep your appointments with your specialists as you may have planned

## 2018-09-27 NOTE — Assessment & Plan Note (Signed)
stable overall by history and exam, recent data reviewed with pt, and pt to continue medical treatment as before,  to f/u any worsening symptoms or concerns  

## 2018-09-27 NOTE — Assessment & Plan Note (Signed)
With persistent pain, to cont mobic, gabapentin and add flexeril 5 prn, and tramadol prn

## 2018-09-27 NOTE — Assessment & Plan Note (Signed)
To cont the gabapentin same dose for now, consider increase for persistent pain

## 2018-09-27 NOTE — Assessment & Plan Note (Signed)
For pain control, for referral to NS, will need FMLA extended to Nov 08, 2018 to accomodate time for further evaluation and tx

## 2018-09-29 DIAGNOSIS — L84 Corns and callosities: Secondary | ICD-10-CM | POA: Diagnosis not present

## 2018-09-29 DIAGNOSIS — B351 Tinea unguium: Secondary | ICD-10-CM | POA: Diagnosis not present

## 2018-09-29 DIAGNOSIS — E1142 Type 2 diabetes mellitus with diabetic polyneuropathy: Secondary | ICD-10-CM | POA: Diagnosis not present

## 2018-10-02 ENCOUNTER — Other Ambulatory Visit (HOSPITAL_COMMUNITY): Payer: Self-pay | Admitting: Neurological Surgery

## 2018-10-02 ENCOUNTER — Other Ambulatory Visit: Payer: Self-pay | Admitting: Neurological Surgery

## 2018-10-02 DIAGNOSIS — M5412 Radiculopathy, cervical region: Secondary | ICD-10-CM

## 2018-10-04 DIAGNOSIS — Z23 Encounter for immunization: Secondary | ICD-10-CM | POA: Diagnosis not present

## 2018-10-06 ENCOUNTER — Ambulatory Visit (HOSPITAL_COMMUNITY)
Admission: RE | Admit: 2018-10-06 | Discharge: 2018-10-06 | Disposition: A | Discharge status: Home / Self Care | Payer: 59 | Source: Ambulatory Visit | Attending: Neurological Surgery | Admitting: Neurological Surgery

## 2018-10-06 DIAGNOSIS — M5412 Radiculopathy, cervical region: Secondary | ICD-10-CM | POA: Diagnosis present

## 2018-10-06 DIAGNOSIS — M50123 Cervical disc disorder at C6-C7 level with radiculopathy: Secondary | ICD-10-CM | POA: Diagnosis not present

## 2018-10-06 DIAGNOSIS — M4802 Spinal stenosis, cervical region: Secondary | ICD-10-CM | POA: Diagnosis not present

## 2018-10-09 ENCOUNTER — Other Ambulatory Visit (INDEPENDENT_AMBULATORY_CARE_PROVIDER_SITE_OTHER): Payer: 59

## 2018-10-09 DIAGNOSIS — E119 Type 2 diabetes mellitus without complications: Secondary | ICD-10-CM

## 2018-10-09 DIAGNOSIS — Z6829 Body mass index (BMI) 29.0-29.9, adult: Secondary | ICD-10-CM | POA: Diagnosis not present

## 2018-10-09 DIAGNOSIS — M5412 Radiculopathy, cervical region: Secondary | ICD-10-CM | POA: Diagnosis not present

## 2018-10-09 DIAGNOSIS — I10 Essential (primary) hypertension: Secondary | ICD-10-CM | POA: Diagnosis not present

## 2018-10-09 LAB — LIPID PANEL
Cholesterol: 140 mg/dL (ref 0–200)
HDL: 43.9 mg/dL (ref >39.00)
LDL Cholesterol: 79 mg/dL (ref 0–99)
NonHDL: 95.74
TRIGLYCERIDES: 83 mg/dL (ref 0.0–149.0)
Total CHOL/HDL Ratio: 3
VLDL: 16.6 mg/dL (ref 0.0–40.0)

## 2018-10-09 LAB — BASIC METABOLIC PANEL
BUN: 22 mg/dL (ref 6–23)
CO2: 26 mEq/L (ref 19–32)
CREATININE: 1.07 mg/dL (ref 0.40–1.50)
Calcium: 9.5 mg/dL (ref 8.4–10.5)
Chloride: 104 mEq/L (ref 96–112)
GFR: 70.31 mL/min (ref >60.00)
Glucose, Bld: 157 mg/dL — ABNORMAL HIGH (ref 70–99)
Potassium: 4.2 mEq/L (ref 3.5–5.1)
Sodium: 138 mEq/L (ref 135–145)

## 2018-10-09 LAB — HEPATIC FUNCTION PANEL
ALBUMIN: 4.1 g/dL (ref 3.5–5.2)
ALT: 18 U/L (ref 0–53)
AST: 16 U/L (ref 0–37)
Alkaline Phosphatase: 32 U/L — ABNORMAL LOW (ref 39–117)
Bilirubin, Direct: 0.1 mg/dL (ref 0.0–0.3)
Total Bilirubin: 0.4 mg/dL (ref 0.2–1.2)
Total Protein: 6.9 g/dL (ref 6.0–8.3)

## 2018-10-09 LAB — HEMOGLOBIN A1C: Hgb A1c MFr Bld: 7.6 % — ABNORMAL HIGH (ref 4.6–6.5)

## 2018-10-12 ENCOUNTER — Other Ambulatory Visit: Payer: Self-pay | Admitting: Neurological Surgery

## 2018-10-13 NOTE — Pre-Procedure Instructions (Signed)
Scott Valentine  10/13/2018      [facility] [place] [address], [place] [address] [place] [address] Phone: [phone] Fax: [phone]    Your procedure is scheduled on October 20, 2018.  Report to [facility] Admitting at 1000 AM.  Call this number if you have problems the morning of surgery:  [phone]   Remember:  Do not eat or drink after midnight.   Take these medicines the morning of surgery with A SIP OF WATER  Cyclobenzaprine (Flexeril)-if needed Metoprolol tartrate (lopressor) Escitalopram (lexapro) Fexofenadine (allegra) Gabapentin (neurontin) Tramadol (ultram)-if needed Hydrocodone-acetaminophen (norco)-if needed Lorazepam (ativan)-if needed  Follow your surgeon's instructions on when to hold/resume aspirin.  If no instructions were given call the office to determine how they would like to you take aspirin  7 days prior to surgery STOP taking any meloxicam (mobic), Aleve, Naproxen, Ibuprofen, Motrin, Advil, Goody's, BC's, all herbal medications, fish oil, and all vitamins  WHAT DO I DO ABOUT MY DIABETES MEDICATION?  Marland Kitchen Do not take oral diabetes medicines (pills) the morning of surgery-empagliflozin (Jardiance), metformin (glucophage), glipizide (glucotrol XL) .  Marland Kitchen THE DAY BEFORE SURGERY, DO NOT take empagliflozin (Jardiance).  Reviewed and Endorsed by [facility] Patient Education Committee, August 2015  How to Manage Your Diabetes Before and After Surgery  Why is it important to control my blood sugar before and after surgery? . Improving blood sugar levels before and after surgery helps healing and can limit problems. . A way of improving blood sugar control is eating a healthy diet by: o  Eating less sugar and carbohydrates o  Increasing activity/exercise o  Talking with your doctor about reaching your blood sugar goals . High blood sugars (greater than 180 mg/dL) can raise your risk of infections and slow  your recovery, so you will need to focus on controlling your diabetes during the weeks before surgery. . Make sure that the doctor who takes care of your diabetes knows about your planned surgery including the date and location.  How do I manage my blood sugar before surgery? . Check your blood sugar at least 4 times a day, starting 2 days before surgery, to make sure that the level is not too high or low. o Check your blood sugar the morning of your surgery when you wake up and every 2 hours until you get to the Short Stay unit. . If your blood sugar is less than 70 mg/dL, you will need to treat for low blood sugar: o Do not take insulin. o Treat a low blood sugar (less than 70 mg/dL) with  cup of clear juice (cranberry or apple), 4 glucose tablets, OR glucose gel. Recheck blood sugar in 15 minutes after treatment (to make sure it is greater than 70 mg/dL). If your blood sugar is not greater than 70 mg/dL on recheck, call [phone] o  for further instructions. . Report your blood sugar to the short stay nurse when you get to Short Stay.  . If you are admitted to the hospital after surgery: o Your blood sugar will be checked by the staff and you will probably be given insulin after surgery (instead of oral diabetes medicines) to make sure you have good blood sugar levels. o The goal for blood sugar control after surgery is 80-180 mg/dL.   [facility]- Preparing For Surgery  Before surgery, you can play an important role. Because skin is not sterile, your skin  needs to be as free of germs as possible. You can reduce the number of germs on your skin by washing with CHG (chlorahexidine gluconate) Soap before surgery.  CHG is an antiseptic cleaner which kills germs and bonds with the skin to continue killing germs even after washing.    Oral Hygiene is also important to reduce your risk of infection.  Remember - BRUSH YOUR TEETH THE MORNING OF SURGERY WITH YOUR REGULAR TOOTHPASTE  Please do  not use if you have an allergy to CHG or antibacterial soaps. If your skin becomes reddened/irritated stop using the CHG.  Do not shave (including legs and underarms) for at least 48 hours prior to first CHG shower. It is OK to shave your face.  Please follow these instructions carefully.   1. Shower the NIGHT BEFORE SURGERY and the MORNING OF SURGERY with CHG.   2. If you chose to wash your hair, wash your hair first as usual with your normal shampoo.  3. After you shampoo, rinse your hair and body thoroughly to remove the shampoo.  4. Use CHG as you would any other liquid soap. You can apply CHG directly to the skin and wash gently with a scrungie or a clean washcloth.   5. Apply the CHG Soap to your body ONLY FROM THE NECK DOWN.  Do not use on open wounds or open sores. Avoid contact with your eyes, ears, mouth and genitals (private parts). Wash Face and genitals (private parts)  with your normal soap.  6. Wash thoroughly, paying special attention to the area where your surgery will be performed.  7. Thoroughly rinse your body with warm water from the neck down.  8. DO NOT shower/wash with your normal soap after using and rinsing off the CHG Soap.  9. Pat yourself dry with a CLEAN TOWEL.  10. Wear CLEAN PAJAMAS to bed the night before surgery, wear comfortable clothes the morning of surgery  11. Place CLEAN SHEETS on your bed the night of your first shower and DO NOT SLEEP WITH PETS.  Day of Surgery:  Do not apply any deodorants/lotions.  Please wear clean clothes to the hospital/surgery center.   Remember to brush your teeth WITH YOUR REGULAR TOOTHPASTE.    Do not wear jewelry  Do not wear lotions, powders, or colognes, or deodorant.  Men may shave face and neck.  Do not bring valuables to the hospital.  [facility] is not responsible for any belongings or valuables.  Contacts, dentures or bridgework may not be worn into surgery.  Leave your suitcase in the car.  After  surgery it may be brought to your room.  For patients admitted to the hospital, discharge time will be determined by your treatment team.  Patients discharged the day of surgery will not be allowed to drive home.   Please read over the following fact sheets that you were given. Pain Booklet, Coughing and Deep Breathing, MRSA Information and Surgical Site Infection Prevention

## 2018-10-14 ENCOUNTER — Encounter (HOSPITAL_COMMUNITY)
Admission: RE | Admit: 2018-10-14 | Discharge: 2018-10-14 | Disposition: A | Discharge status: Home / Self Care | Payer: 59 | Source: Ambulatory Visit | Attending: Neurological Surgery | Admitting: Neurological Surgery

## 2018-10-14 ENCOUNTER — Other Ambulatory Visit: Payer: Self-pay

## 2018-10-14 ENCOUNTER — Encounter (HOSPITAL_COMMUNITY): Payer: Self-pay

## 2018-10-14 DIAGNOSIS — Z01818 Encounter for other preprocedural examination: Secondary | ICD-10-CM | POA: Diagnosis present

## 2018-10-14 DIAGNOSIS — M5412 Radiculopathy, cervical region: Secondary | ICD-10-CM | POA: Insufficient documentation

## 2018-10-14 HISTORY — DX: Type 2 diabetes mellitus without complications: E11.9

## 2018-10-14 LAB — CBC
HCT: 44.5 % (ref 39.0–52.0)
HEMOGLOBIN: 14.5 g/dL (ref 13.0–17.0)
MCH: 30 pg (ref 26.0–34.0)
MCHC: 32.6 g/dL (ref 30.0–36.0)
MCV: 92.1 fL (ref 80.0–100.0)
Platelets: 207 10*3/uL (ref 150–400)
RBC: 4.83 MIL/uL (ref 4.22–5.81)
RDW: 12.7 % (ref 11.5–15.5)
WBC: 6.9 10*3/uL (ref 4.0–10.5)
nRBC: 0 % (ref 0.0–0.2)

## 2018-10-14 LAB — SURGICAL PCR SCREEN
MRSA, PCR: NEGATIVE
Staphylococcus aureus: NEGATIVE

## 2018-10-14 LAB — GLUCOSE, CAPILLARY: Glucose-Capillary: 135 mg/dL — ABNORMAL HIGH (ref 70–99)

## 2018-10-14 NOTE — Progress Notes (Signed)
PCP - John Cardiologist -none   Chest x-ray - n/a EKG - today Stress Test -2011- ?   ECHO - none Cardiac Cath - none  Sleep Study - n/a CPAP - n/a  Fasting Blood Sugar -135  Checks Blood Sugar __0___ times a day  Blood Thinner Instructions:none Aspirin Instructions:already holding   Anesthesia review: pending   Patient denies shortness of breath, fever, cough and chest pain at PAT appointment   Patient verbalized understanding of instructions that were given to them at the PAT appointment. Patient was also instructed that they will need to review over the PAT instructions again at home before surgery.  Pt. Reports that he had a stress test several yrs. Ago when he was under the care of Dr. Linna Darner. Pt. Doesn't recall the reason for having it done but reports that it was normal, no follow up requiutred.  Stress test result not found in Epic.

## 2018-10-14 NOTE — Pre-Procedure Instructions (Signed)
Scott Valentine  10/14/2018      [place] [place] [address] [place] [address] Phone: [phone] Fax: [phone]    Your procedure is scheduled on October 21, 2018.  Report to [facility] Admitting at 1000 AM.  Call this number if you have problems the morning of surgery:  [phone]   Remember:  Do not eat or drink after midnight.   Take these medicines the morning of surgery with A SIP OF WATER  Cyclobenzaprine (Flexeril)-if needed Metoprolol tartrate (lopressor) Escitalopram (lexapro) Fexofenadine (allegra) Gabapentin (neurontin) Tramadol (ultram)-if needed Hydrocodone-acetaminophen (norco)-if needed Lorazepam (ativan)-if needed  Follow your surgeon's instructions on when to hold/resume aspirin.  If no instructions were given call the office to determine how they would like to you take aspirin  7 days prior to surgery STOP taking any meloxicam (mobic), Aleve, Naproxen, Ibuprofen, Motrin, Advil, Goody's, BC's, all herbal medications, fish oil, and all vitamins  WHAT DO I DO ABOUT MY DIABETES MEDICATION?  Marland Kitchen Do not take oral diabetes medicines (pills) the morning of surgery-empagliflozin (Jardiance), metformin (glucophage), glipizide (glucotrol XL) .  Marland Kitchen THE DAY BEFORE SURGERY, DO NOT take empagliflozin (Jardiance).  Reviewed and Endorsed by [facility] Patient Education Committee, August 2015  How to Manage Your Diabetes Before and After Surgery  Why is it important to control my blood sugar before and after surgery? . Improving blood sugar levels before and after surgery helps healing and can limit problems. . A way of improving blood sugar control is eating a healthy diet by: o  Eating less sugar and carbohydrates o  Increasing activity/exercise o  Talking with your doctor about reaching your blood sugar goals . High blood sugars (greater than 180 mg/dL) can raise your risk of infections and slow  your recovery, so you will need to focus on controlling your diabetes during the weeks before surgery. . Make sure that the doctor who takes care of your diabetes knows about your planned surgery including the date and location.  How do I manage my blood sugar before surgery? . Check your blood sugar at least 4 times a day, starting 2 days before surgery, to make sure that the level is not too high or low. o Check your blood sugar the morning of your surgery when you wake up and every 2 hours until you get to the Short Stay unit. . If your blood sugar is less than 70 mg/dL, you will need to treat for low blood sugar: o Do not take insulin. o Treat a low blood sugar (less than 70 mg/dL) with  cup of clear juice (cranberry or apple), 4 glucose tablets, OR glucose gel. Recheck blood sugar in 15 minutes after treatment (to make sure it is greater than 70 mg/dL). If your blood sugar is not greater than 70 mg/dL on recheck, call [phone] o  for further instructions. . Report your blood sugar to the short stay nurse when you get to Short Stay.  . If you are admitted to the hospital after surgery: o Your blood sugar will be checked by the staff and you will probably be given insulin after surgery (instead of oral diabetes medicines) to make sure you have good blood sugar levels. o The goal for blood sugar control after surgery is 80-180 mg/dL.   [facility]- Preparing For Surgery  Before surgery, you can play an important role. Because skin is not sterile, your skin  needs to be as free of germs as possible. You can reduce the number of germs on your skin by washing with CHG (chlorahexidine gluconate) Soap before surgery.  CHG is an antiseptic cleaner which kills germs and bonds with the skin to continue killing germs even after washing.    Oral Hygiene is also important to reduce your risk of infection.  Remember - BRUSH YOUR TEETH THE MORNING OF SURGERY WITH YOUR REGULAR TOOTHPASTE  Please do  not use if you have an allergy to CHG or antibacterial soaps. If your skin becomes reddened/irritated stop using the CHG.  Do not shave (including legs and underarms) for at least 48 hours prior to first CHG shower. It is OK to shave your face.  Please follow these instructions carefully.   1. Shower the NIGHT BEFORE SURGERY and the MORNING OF SURGERY with CHG.   2. If you chose to wash your hair, wash your hair first as usual with your normal shampoo.  3. After you shampoo, rinse your hair and body thoroughly to remove the shampoo.  4. Use CHG as you would any other liquid soap. You can apply CHG directly to the skin and wash gently with a scrungie or a clean washcloth.   5. Apply the CHG Soap to your body ONLY FROM THE NECK DOWN.  Do not use on open wounds or open sores. Avoid contact with your eyes, ears, mouth and genitals (private parts). Wash Face and genitals (private parts)  with your normal soap.  6. Wash thoroughly, paying special attention to the area where your surgery will be performed.  7. Thoroughly rinse your body with warm water from the neck down.  8. DO NOT shower/wash with your normal soap after using and rinsing off the CHG Soap.  9. Pat yourself dry with a CLEAN TOWEL.  10. Wear CLEAN PAJAMAS to bed the night before surgery, wear comfortable clothes the morning of surgery  11. Place CLEAN SHEETS on your bed the night of your first shower and DO NOT SLEEP WITH PETS.  Day of Surgery:  Do not apply any deodorants/lotions.  Please wear clean clothes to the hospital/surgery center.   Remember to brush your teeth WITH YOUR REGULAR TOOTHPASTE.    Do not wear jewelry  Do not wear lotions, powders, or colognes, or deodorant.  Men may shave face and neck.  Do not bring valuables to the hospital.  [facility] is not responsible for any belongings or valuables.  Contacts, dentures or bridgework may not be worn into surgery.  Leave your suitcase in the car.  After  surgery it may be brought to your room.  For patients admitted to the hospital, discharge time will be determined by your treatment team.  Patients discharged the day of surgery will not be allowed to drive home.   Please read over the following fact sheets that you were given. Pain Booklet, Coughing and Deep Breathing, MRSA Information and Surgical Site Infection Prevention

## 2018-10-16 ENCOUNTER — Encounter: Payer: Self-pay | Admitting: Internal Medicine

## 2018-10-16 ENCOUNTER — Ambulatory Visit: Payer: 59 | Admitting: Internal Medicine

## 2018-10-16 VITALS — BP 124/76 | HR 84 | Temp 98.7°F | Ht 69.0 in | Wt 203.0 lb

## 2018-10-16 DIAGNOSIS — E119 Type 2 diabetes mellitus without complications: Secondary | ICD-10-CM | POA: Diagnosis not present

## 2018-10-16 DIAGNOSIS — Z0000 Encounter for general adult medical examination without abnormal findings: Secondary | ICD-10-CM | POA: Diagnosis not present

## 2018-10-16 DIAGNOSIS — I10 Essential (primary) hypertension: Secondary | ICD-10-CM | POA: Diagnosis not present

## 2018-10-16 DIAGNOSIS — M5412 Radiculopathy, cervical region: Secondary | ICD-10-CM | POA: Diagnosis not present

## 2018-10-16 MED ORDER — BLOOD GLUCOSE METER KIT: PACK | 0 refills | Status: DC

## 2018-10-16 MED ORDER — GLUCOSE BLOOD VI STRP: ORAL_STRIP | 12 refills | Status: DC

## 2018-10-16 MED ORDER — LANCETS MISC: 11 refills | Status: DC

## 2018-10-16 NOTE — Assessment & Plan Note (Signed)
Cont treatment, pain control, work note updated, FMLA form to be redone

## 2018-10-16 NOTE — Patient Instructions (Addendum)
Your prescription for the glucometer and supplis were sent  You are given the updated letter for out of work  Your [facility] form will be updated as well.   Please continue all other medications as before, and refills have been done if requested.  Please have the pharmacy call with any other refills you may need.  Please continue your efforts at being more active, low cholesterol diet, and weight control.  You are otherwise up to date with prevention measures today.  Please keep your appointments with your specialists as you may have planned  Please return in 3 months, or sooner if needed, with Lab testing done 3-5 days before  OK to cancel the appointment for 6 months

## 2018-10-16 NOTE — Assessment & Plan Note (Signed)
stable overall by history and exam, recent data reviewed with pt, and pt to continue medical treatment as before,  to f/u any worsening symptoms or concerns  

## 2018-10-16 NOTE — Progress Notes (Signed)
Subjective:    Patient ID: Scott Valentine, male    DOB: 1955-10-30, 63 y.o.   MRN: [id number]  HPI  Here to f/u; overall doing ok,  Pt denies chest pain, increasing sob or doe, wheezing, orthopnea, PND, increased LE swelling, palpitations, dizziness or syncope.  Pt denies new neurological symptoms such as new headache, or facial or extremity weakness or numbness.  Pt denies polydipsia, polyuria, or low sugar episode.  Pt states overall good compliance with meds, mostly trying to follow appropriate diet, with wt overall stable.  S/p ESI x 2 in late dec, early January, limited improvement, so now for Cervical sugury at C-5-6 and C6-7 with "trimming of the bones on both sides" where there exists radiculopathy.  Had cbc yesterday preop - normal.   Had CBG at hosp at 135.  Needs updated work notes and forms filled out now that has definite dates.  Needs [employer] form (and letter)  to say first date out of work was actually from Aug 24, 2018, and has limit of 90 days, so form has to say return to work on Nov 16, 2018 without restrictions (those dates verified with pt) Past Medical History:  Diagnosis Date  . Allergy   . Anxiety state, unspecified 10/08/2007  . Cough 11/08/2009  . Diabetes mellitus without complication ([place])   . Dysmetabolic syndrome X 0/25/8527  . GERD 05/23/2008  . GLUCOSE INTOLERANCE 10/19/2008  . HYPERLIPIDEMIA 10/08/2007  . HYPERPLASIA PROSTATE UNS W/O UR OBST & OTH LUTS 10/08/2007  . Impaired glucose tolerance 12/30/2010  . Other and unspecified hyperlipidemia 05/01/2007  . Rectal bleeding   . 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 TOBACCO USE, QUIT 10/08/2007  . UNS ADVRS EFF UNS RX MEDICINAL&BIOLOGICAL SBSTNC 12/17/2007  . Unspecified essential hypertension 05/01/2007  . URI 10/29/2007   Past Surgical History:  Procedure Laterality Date  . ANAL FISSURE REPAIR  04/25/11  . finger surgury' Left 05/30/2008   3rd finger  . HEMORRHOID SURGERY  04/25/11    reports that he has been  smoking. He has a 44.00 pack-year smoking history. He has never used smokeless tobacco. He reports that he does not drink alcohol or use drugs. family history includes Coronary artery disease (age of onset: 77) in his father; Diabetes in his mother; Heart attack in his paternal grandfather; Heart disease in his brother and father; Lung cancer in his father; Mental illness in his mother; Prostate cancer in his paternal grandfather. Allergies  Allergen Reactions  . Sulfonamide Derivatives Other (See Comments)    stevens johnson  . Atorvastatin Other (See Comments)    myalgias  . Ezetimibe Other (See Comments)    myalgias   Current Outpatient Medications on File Prior to Visit  Medication Sig Dispense Refill  . aspirin EC 81 MG tablet Take 1 tablet (81 mg total) by mouth daily. 90 tablet 11  . cyclobenzaprine (FLEXERIL) 10 MG tablet Take 0.5-1 tablets (5-10 mg total) by mouth 2 (two) times daily as needed for muscle spasms. 120 tablet 2  . empagliflozin (JARDIANCE) 25 MG TABS tablet Take 25 mg by mouth daily. 90 tablet 3  . escitalopram (LEXAPRO) 20 MG tablet TAKE 1 TABLET BY MOUTH ONCE DAILY (Patient taking differently: Take 20 mg by mouth daily. ) 30 tablet 11  . fenofibrate 160 MG tablet TAKE 1 TABLET BY MOUTH ONCE DAILY (Patient taking differently: Take 160 mg by mouth daily. ) 30 tablet 11  . fexofenadine (ALLEGRA) 180 MG tablet Take 1 tablet (  180 mg total) by mouth daily as needed. (Patient taking differently: Take 180 mg by mouth daily. ) 90 tablet 3  . folic acid (FOLVITE) 1 MG tablet Take 1 tablet (1 mg total) by mouth daily. 90 tablet 3  . gabapentin (NEURONTIN) 100 MG capsule Take 1 capsule (100 mg total) by mouth 3 (three) times daily. (Patient taking differently: Take 100 mg by mouth 3 (three) times daily. Pt. Reports that he is not taking since mid Jan. 2020) 90 capsule 5  . glipiZIDE (GLUCOTROL XL) 10 MG 24 hr tablet TAKE 1 TABLET BY MOUTH ONCE DAILY WITH BREAKFAST (Patient taking  differently: Take 10 mg by mouth daily with breakfast. ) 30 tablet 11  . HYDROcodone-acetaminophen (NORCO/VICODIN) 5-325 MG tablet Take 1 tablet by mouth every 6 (six) hours as needed for moderate pain. (Patient taking differently: Take 1 tablet by mouth every 6 (six) hours as needed for moderate pain. Pt. Reports he is not taking since mid Jan. 2020) 120 tablet 0  . LORazepam (ATIVAN) 0.5 MG tablet TAKE 1 TABLET BY MOUTH EVERY DAY AS NEEDED (Patient taking differently: Take 0.5 mg by mouth See admin instructions. Take one tablet (0.5mg ) in the morning and one tablet in the evening as needed for anxiety.) 90 tablet 1  . losartan (COZAAR) 25 MG tablet Take 1 tablet (25 mg total) by mouth 2 (two) times daily. (Patient taking differently: Take 50 mg by mouth daily. ) 180 tablet 3  . magnesium oxide (MAG-OX) 400 MG tablet Take 400 mg by mouth daily.    . meloxicam (MOBIC) 7.5 MG tablet Take 1 tablet (7.5 mg total) by mouth daily. 30 tablet 5  . metFORMIN (GLUCOPHAGE-XR) 500 MG 24 hr tablet TAKE 4 TABLETS BY MOUTH EVERY MORNING (Patient taking differently: Take 2,000 mg by mouth daily with breakfast. ) 120 tablet 11  . metoprolol tartrate (LOPRESSOR) 25 MG tablet TAKE 1/2 TABLET BY MOUTH ONCE DAILY (Patient taking differently: Take 12.5 mg by mouth daily. ) 45 tablet 2  . Multiple Vitamin (MULTIVITAMIN) tablet Take 1 tablet by mouth daily.      . rosuvastatin (CRESTOR) 40 MG tablet TAKE 1 TABLET BY MOUTH ONCE DAILY (Patient taking differently: Take 40 mg by mouth at bedtime. ) 30 tablet 11  . traMADol (ULTRAM) 50 MG tablet Take 1 tablet (50 mg total) by mouth every 6 (six) hours as needed. (Patient taking differently: Take 50 mg by mouth every 6 (six) hours as needed. Pt. Never filled this medicine, has not taken.) 30 tablet 0  . vitamin C (ASCORBIC ACID) 500 MG tablet Take 500 mg by mouth daily.     No current facility-administered medications on file prior to visit.    Review of Systems  Constitutional:  Negative for other unusual diaphoresis or sweats HENT: Negative for ear discharge or swelling Eyes: Negative for other worsening visual disturbances Respiratory: Negative for stridor or other swelling  Gastrointestinal: Negative for worsening distension or other blood Genitourinary: Negative for retention or other urinary change Musculoskeletal: Negative for other MSK pain or swelling Skin: Negative for color change or other new lesions Neurological: Negative for worsening tremors and other numbness  Psychiatric/Behavioral: Negative for worsening agitation or other fatigue All other system neg per pt    Objective:   Physical Exam BP 124/76   Pulse 84   Temp 98.7 F (37.1 C) (Oral)   Ht 5\' 9"  (1.753 m)   Wt 203 lb (92.1 kg)   SpO2 (!) 84%  BMI 29.98 kg/m  VS noted,  Constitutional: Pt appears in NAD HENT: Head: NCAT.  Right Ear: External ear normal.  Left Ear: External ear normal.  Eyes: . Pupils are equal, round, and reactive to light. Conjunctivae and EOM are normal Nose: without d/c or deformity Neck: Neck supple. Gross normal ROM Cardiovascular: Normal rate and regular rhythm.   Pulmonary/Chest: Effort normal and breath sounds without rales or wheezing.  Abd:  Soft, NT, ND, + BS, no organomegaly Neurological: Pt is alert. At baseline orientation, motor grossly intact Skin: Skin is warm. No rashes, other new lesions, no LE edema Psychiatric: Pt behavior is normal without agitation  No other exam findings Lab Results  Component Value Date   WBC 6.9 10/14/2018   HGB 14.5 10/14/2018   HCT 44.5 10/14/2018   PLT 207 10/14/2018   GLUCOSE 157 (H) 10/09/2018   CHOL 140 10/09/2018   TRIG 83.0 10/09/2018   HDL 43.90 10/09/2018   LDLDIRECT 89.0 04/02/2018   LDLCALC 79 10/09/2018   ALT 18 10/09/2018   AST 16 10/09/2018   NA 138 10/09/2018   K 4.2 10/09/2018   CL 104 10/09/2018   CREATININE 1.07 10/09/2018   BUN 22 10/09/2018   CO2 26 10/09/2018   TSH 1.91 04/02/2018     PSA 1.18 04/02/2018   HGBA1C 7.6 (H) 10/09/2018   MICROALBUR <0.7 04/02/2018       Assessment & Plan:

## 2018-10-21 ENCOUNTER — Ambulatory Visit (HOSPITAL_COMMUNITY): Payer: 59 | Admitting: Anesthesiology

## 2018-10-21 ENCOUNTER — Encounter (HOSPITAL_COMMUNITY): Payer: Self-pay

## 2018-10-21 ENCOUNTER — Encounter (HOSPITAL_COMMUNITY)
Admission: RE | Disposition: A | Discharge status: Home / Self Care | Payer: Self-pay | Source: Home / Self Care | Attending: Neurological Surgery

## 2018-10-21 ENCOUNTER — Ambulatory Visit (HOSPITAL_COMMUNITY): Payer: 59

## 2018-10-21 ENCOUNTER — Ambulatory Visit (HOSPITAL_COMMUNITY)
Admission: RE | Admit: 2018-10-21 | Discharge: 2018-10-22 | Disposition: A | Discharge status: Home / Self Care | Payer: 59 | Attending: Neurological Surgery | Admitting: Neurological Surgery

## 2018-10-21 ENCOUNTER — Other Ambulatory Visit: Payer: Self-pay

## 2018-10-21 DIAGNOSIS — I10 Essential (primary) hypertension: Secondary | ICD-10-CM | POA: Insufficient documentation

## 2018-10-21 DIAGNOSIS — E119 Type 2 diabetes mellitus without complications: Secondary | ICD-10-CM | POA: Diagnosis not present

## 2018-10-21 DIAGNOSIS — Z79899 Other long term (current) drug therapy: Secondary | ICD-10-CM | POA: Diagnosis not present

## 2018-10-21 DIAGNOSIS — M4802 Spinal stenosis, cervical region: Secondary | ICD-10-CM | POA: Diagnosis not present

## 2018-10-21 DIAGNOSIS — F17210 Nicotine dependence, cigarettes, uncomplicated: Secondary | ICD-10-CM | POA: Diagnosis not present

## 2018-10-21 DIAGNOSIS — Z8249 Family history of ischemic heart disease and other diseases of the circulatory system: Secondary | ICD-10-CM | POA: Diagnosis not present

## 2018-10-21 DIAGNOSIS — Z7984 Long term (current) use of oral hypoglycemic drugs: Secondary | ICD-10-CM | POA: Insufficient documentation

## 2018-10-21 DIAGNOSIS — Z833 Family history of diabetes mellitus: Secondary | ICD-10-CM | POA: Diagnosis not present

## 2018-10-21 DIAGNOSIS — F419 Anxiety disorder, unspecified: Secondary | ICD-10-CM | POA: Diagnosis not present

## 2018-10-21 DIAGNOSIS — Z419 Encounter for procedure for purposes other than remedying health state, unspecified: Secondary | ICD-10-CM

## 2018-10-21 DIAGNOSIS — E785 Hyperlipidemia, unspecified: Secondary | ICD-10-CM | POA: Insufficient documentation

## 2018-10-21 DIAGNOSIS — E8881 Metabolic syndrome: Secondary | ICD-10-CM | POA: Insufficient documentation

## 2018-10-21 DIAGNOSIS — Z981 Arthrodesis status: Secondary | ICD-10-CM | POA: Diagnosis not present

## 2018-10-21 DIAGNOSIS — M5412 Radiculopathy, cervical region: Secondary | ICD-10-CM | POA: Diagnosis not present

## 2018-10-21 HISTORY — DX: Radiculopathy, cervical region: M54.12

## 2018-10-21 HISTORY — PX: POSTERIOR CERVICAL LAMINECTOMY: SHX2248

## 2018-10-21 LAB — GLUCOSE, CAPILLARY
Glucose-Capillary: 149 mg/dL — ABNORMAL HIGH (ref 70–99)
Glucose-Capillary: 108 mg/dL — ABNORMAL HIGH (ref 70–99)
Glucose-Capillary: 130 mg/dL — ABNORMAL HIGH (ref 70–99)
Glucose-Capillary: 162 mg/dL — ABNORMAL HIGH (ref 70–99)
Glucose-Capillary: 306 mg/dL — ABNORMAL HIGH (ref 70–99)

## 2018-10-21 SURGERY — POSTERIOR CERVICAL LAMINECTOMY
Anesthesia: General | Site: Neck | Laterality: Right

## 2018-10-21 MED ORDER — CEFAZOLIN SODIUM-DEXTROSE 2-4 GM/100ML-% IV SOLN
INTRAVENOUS | Status: AC
  Filled 2018-10-21: qty 100

## 2018-10-21 MED ORDER — PHENYLEPHRINE 40 MCG/ML (10ML) SYRINGE FOR IV PUSH (FOR BLOOD PRESSURE SUPPORT)
PREFILLED_SYRINGE | INTRAVENOUS | Status: DC | PRN
  Administered 2018-10-21: 80 ug via INTRAVENOUS
  Administered 2018-10-21: 120 ug via INTRAVENOUS

## 2018-10-21 MED ORDER — PROMETHAZINE HCL 25 MG/ML IJ SOLN: 6.2500 mg | INTRAMUSCULAR | Status: DC | PRN

## 2018-10-21 MED ORDER — ONDANSETRON HCL 4 MG/2ML IJ SOLN
INTRAMUSCULAR | Status: AC
  Filled 2018-10-21: qty 4

## 2018-10-21 MED ORDER — DEXAMETHASONE SODIUM PHOSPHATE 10 MG/ML IJ SOLN
INTRAMUSCULAR | Status: AC
  Filled 2018-10-21: qty 1

## 2018-10-21 MED ORDER — INSULIN ASPART 100 UNIT/ML ~~LOC~~ SOLN
0.0000 [IU] | Freq: Every day | SUBCUTANEOUS | Status: DC
  Administered 2018-10-21: 4 [IU] via SUBCUTANEOUS

## 2018-10-21 MED ORDER — PHENYLEPHRINE 40 MCG/ML (10ML) SYRINGE FOR IV PUSH (FOR BLOOD PRESSURE SUPPORT)
PREFILLED_SYRINGE | INTRAVENOUS | Status: AC
  Filled 2018-10-21: qty 10

## 2018-10-21 MED ORDER — ONDANSETRON HCL 4 MG/2ML IJ SOLN: 4.0000 mg | Freq: Four times a day (QID) | INTRAMUSCULAR | Status: DC | PRN

## 2018-10-21 MED ORDER — SODIUM CHLORIDE 0.9 % IV SOLN
INTRAVENOUS | Status: DC | PRN
  Administered 2018-10-21: 50 ug/min via INTRAVENOUS

## 2018-10-21 MED ORDER — PROPOFOL 10 MG/ML IV BOLUS
INTRAVENOUS | Status: DC | PRN
  Administered 2018-10-21: 150 mg via INTRAVENOUS

## 2018-10-21 MED ORDER — 0.9 % SODIUM CHLORIDE (POUR BTL) OPTIME
TOPICAL | Status: DC | PRN
  Administered 2018-10-21: 1000 mL

## 2018-10-21 MED ORDER — CHLORHEXIDINE GLUCONATE CLOTH 2 % EX PADS: 6.0000 | MEDICATED_PAD | Freq: Once | CUTANEOUS | Status: DC

## 2018-10-21 MED ORDER — THROMBIN 5000 UNITS EX SOLR
CUTANEOUS | Status: AC
  Filled 2018-10-21: qty 5000

## 2018-10-21 MED ORDER — ROSUVASTATIN CALCIUM 20 MG PO TABS
40.0000 mg | ORAL_TABLET | Freq: Every day | ORAL | Status: DC
  Administered 2018-10-21: 40 mg via ORAL
  Filled 2018-10-21: qty 2

## 2018-10-21 MED ORDER — ONDANSETRON HCL 4 MG PO TABS: 4.0000 mg | ORAL_TABLET | Freq: Four times a day (QID) | ORAL | Status: DC | PRN

## 2018-10-21 MED ORDER — FENOFIBRATE 160 MG PO TABS
160.0000 mg | ORAL_TABLET | Freq: Every day | ORAL | Status: DC
  Administered 2018-10-21: 160 mg via ORAL
  Filled 2018-10-21: qty 1

## 2018-10-21 MED ORDER — PHENYLEPHRINE HCL 10 MG/ML IJ SOLN
INTRAMUSCULAR | Status: DC | PRN
  Administered 2018-10-21: 40 ug via INTRAVENOUS

## 2018-10-21 MED ORDER — ESCITALOPRAM OXALATE 20 MG PO TABS
20.0000 mg | ORAL_TABLET | Freq: Every day | ORAL | Status: DC
  Filled 2018-10-21: qty 1

## 2018-10-21 MED ORDER — CANAGLIFLOZIN 100 MG PO TABS
100.0000 mg | ORAL_TABLET | Freq: Every day | ORAL | Status: DC
  Administered 2018-10-22: 100 mg via ORAL
  Filled 2018-10-21: qty 1

## 2018-10-21 MED ORDER — FENTANYL CITRATE (PF) 250 MCG/5ML IJ SOLN
INTRAMUSCULAR | Status: AC
  Filled 2018-10-21: qty 5

## 2018-10-21 MED ORDER — GABAPENTIN 100 MG PO CAPS
100.0000 mg | ORAL_CAPSULE | Freq: Three times a day (TID) | ORAL | Status: DC
  Administered 2018-10-21 (×2): 100 mg via ORAL
  Filled 2018-10-21 (×2): qty 1

## 2018-10-21 MED ORDER — LIDOCAINE 2% (20 MG/ML) 5 ML SYRINGE
INTRAMUSCULAR | Status: AC
  Filled 2018-10-21: qty 10

## 2018-10-21 MED ORDER — ACETAMINOPHEN 650 MG RE SUPP: 650.0000 mg | RECTAL | Status: DC | PRN

## 2018-10-21 MED ORDER — HYDROMORPHONE HCL 1 MG/ML IJ SOLN: 0.5000 mg | INTRAMUSCULAR | Status: DC | PRN

## 2018-10-21 MED ORDER — OXYCODONE HCL 5 MG PO TABS: 5.0000 mg | ORAL_TABLET | ORAL | Status: DC | PRN

## 2018-10-21 MED ORDER — CEFAZOLIN SODIUM-DEXTROSE 2-4 GM/100ML-% IV SOLN
2.0000 g | Freq: Three times a day (TID) | INTRAVENOUS | Status: AC
  Administered 2018-10-21 – 2018-10-22 (×2): 2 g via INTRAVENOUS
  Filled 2018-10-21 (×2): qty 100

## 2018-10-21 MED ORDER — SODIUM CHLORIDE 0.9% FLUSH: 3.0000 mL | INTRAVENOUS | Status: DC | PRN

## 2018-10-21 MED ORDER — MAGNESIUM OXIDE 400 (241.3 MG) MG PO TABS
400.0000 mg | ORAL_TABLET | Freq: Every day | ORAL | Status: DC
  Administered 2018-10-21: 400 mg via ORAL
  Filled 2018-10-21: qty 1

## 2018-10-21 MED ORDER — METFORMIN HCL ER 500 MG PO TB24
2000.0000 mg | ORAL_TABLET | Freq: Every day | ORAL | Status: DC
  Administered 2018-10-22: 2000 mg via ORAL
  Filled 2018-10-21: qty 4

## 2018-10-21 MED ORDER — LACTATED RINGERS IV SOLN
INTRAVENOUS | Status: DC
  Administered 2018-10-21 (×2): via INTRAVENOUS

## 2018-10-21 MED ORDER — LIDOCAINE-EPINEPHRINE 1 %-1:100000 IJ SOLN
INTRAMUSCULAR | Status: AC
  Filled 2018-10-21: qty 1

## 2018-10-21 MED ORDER — CYCLOBENZAPRINE HCL 5 MG PO TABS
5.0000 mg | ORAL_TABLET | Freq: Two times a day (BID) | ORAL | Status: DC | PRN
  Administered 2018-10-21: 10 mg via ORAL
  Filled 2018-10-21: qty 2

## 2018-10-21 MED ORDER — FENTANYL CITRATE (PF) 250 MCG/5ML IJ SOLN
INTRAMUSCULAR | Status: DC | PRN
  Administered 2018-10-21: 50 ug via INTRAVENOUS
  Administered 2018-10-21 (×3): 100 ug via INTRAVENOUS

## 2018-10-21 MED ORDER — OXYCODONE HCL 5 MG PO TABS
10.0000 mg | ORAL_TABLET | ORAL | Status: DC | PRN
  Administered 2018-10-21 – 2018-10-22 (×3): 10 mg via ORAL
  Filled 2018-10-21 (×3): qty 2

## 2018-10-21 MED ORDER — GLYCOPYRROLATE 0.2 MG/ML IJ SOLN
INTRAMUSCULAR | Status: DC | PRN
  Administered 2018-10-21: 0.2 mg via INTRAVENOUS

## 2018-10-21 MED ORDER — LORATADINE 10 MG PO TABS: 10.0000 mg | ORAL_TABLET | Freq: Every day | ORAL | Status: DC

## 2018-10-21 MED ORDER — GLIPIZIDE ER 10 MG PO TB24
10.0000 mg | ORAL_TABLET | Freq: Every day | ORAL | Status: DC
  Administered 2018-10-22: 10 mg via ORAL
  Filled 2018-10-21: qty 1

## 2018-10-21 MED ORDER — GLYCOPYRROLATE PF 0.2 MG/ML IJ SOSY
PREFILLED_SYRINGE | INTRAMUSCULAR | Status: AC
  Filled 2018-10-21: qty 2

## 2018-10-21 MED ORDER — MEPERIDINE HCL 50 MG/ML IJ SOLN: 6.2500 mg | INTRAMUSCULAR | Status: DC | PRN

## 2018-10-21 MED ORDER — LIDOCAINE 2% (20 MG/ML) 5 ML SYRINGE
INTRAMUSCULAR | Status: DC | PRN
  Administered 2018-10-21: 100 mg via INTRAVENOUS

## 2018-10-21 MED ORDER — PROPOFOL 10 MG/ML IV BOLUS
INTRAVENOUS | Status: AC
  Filled 2018-10-21: qty 20

## 2018-10-21 MED ORDER — BACITRACIN ZINC 500 UNIT/GM EX OINT
TOPICAL_OINTMENT | CUTANEOUS | Status: DC | PRN
  Administered 2018-10-21: 1 via TOPICAL

## 2018-10-21 MED ORDER — DEXAMETHASONE SODIUM PHOSPHATE 10 MG/ML IJ SOLN
INTRAMUSCULAR | Status: DC | PRN
  Administered 2018-10-21: 10 mg via INTRAVENOUS

## 2018-10-21 MED ORDER — ROCURONIUM BROMIDE 50 MG/5ML IV SOSY
PREFILLED_SYRINGE | INTRAVENOUS | Status: AC
  Filled 2018-10-21: qty 5

## 2018-10-21 MED ORDER — MIDAZOLAM HCL 2 MG/2ML IJ SOLN
INTRAMUSCULAR | Status: AC
  Filled 2018-10-21: qty 2

## 2018-10-21 MED ORDER — POLYETHYLENE GLYCOL 3350 17 G PO PACK: 17.0000 g | PACK | Freq: Every day | ORAL | Status: DC | PRN

## 2018-10-21 MED ORDER — MIDAZOLAM HCL 5 MG/5ML IJ SOLN
INTRAMUSCULAR | Status: DC | PRN
  Administered 2018-10-21: 2 mg via INTRAVENOUS

## 2018-10-21 MED ORDER — LORAZEPAM 0.5 MG PO TABS: 0.5000 mg | ORAL_TABLET | Freq: Four times a day (QID) | ORAL | Status: DC | PRN

## 2018-10-21 MED ORDER — SODIUM CHLORIDE 0.9 % IV SOLN: 250.0000 mL | INTRAVENOUS | Status: DC

## 2018-10-21 MED ORDER — THROMBIN 5000 UNITS EX SOLR
OROMUCOSAL | Status: DC | PRN
  Administered 2018-10-21: 13:00:00 via TOPICAL

## 2018-10-21 MED ORDER — ONDANSETRON HCL 4 MG/2ML IJ SOLN
INTRAMUSCULAR | Status: DC | PRN
  Administered 2018-10-21: 4 mg via INTRAVENOUS

## 2018-10-21 MED ORDER — SODIUM CHLORIDE 0.9 % IV SOLN
INTRAVENOUS | Status: DC | PRN
  Administered 2018-10-21: 13:00:00

## 2018-10-21 MED ORDER — BACITRACIN ZINC 500 UNIT/GM EX OINT
TOPICAL_OINTMENT | CUTANEOUS | Status: AC
  Filled 2018-10-21: qty 28.35

## 2018-10-21 MED ORDER — ACETAMINOPHEN 325 MG PO TABS: 650.0000 mg | ORAL_TABLET | ORAL | Status: DC | PRN

## 2018-10-21 MED ORDER — CEFAZOLIN SODIUM-DEXTROSE 2-4 GM/100ML-% IV SOLN
2.0000 g | INTRAVENOUS | Status: AC
  Administered 2018-10-21: 2 g via INTRAVENOUS

## 2018-10-21 MED ORDER — ROCURONIUM BROMIDE 100 MG/10ML IV SOLN
INTRAVENOUS | Status: DC | PRN
  Administered 2018-10-21: 40 mg via INTRAVENOUS
  Administered 2018-10-21: 30 mg via INTRAVENOUS
  Administered 2018-10-21: 10 mg via INTRAVENOUS
  Administered 2018-10-21: 30 mg via INTRAVENOUS

## 2018-10-21 MED ORDER — HYDROMORPHONE HCL 1 MG/ML IJ SOLN: 0.2500 mg | INTRAMUSCULAR | Status: DC | PRN

## 2018-10-21 MED ORDER — SODIUM CHLORIDE 0.9% FLUSH: 3.0000 mL | Freq: Two times a day (BID) | INTRAVENOUS | Status: DC

## 2018-10-21 MED ORDER — MENTHOL 3 MG MT LOZG: 1.0000 | LOZENGE | OROMUCOSAL | Status: DC | PRN

## 2018-10-21 MED ORDER — INSULIN ASPART 100 UNIT/ML ~~LOC~~ SOLN: 0.0000 [IU] | Freq: Three times a day (TID) | SUBCUTANEOUS | Status: DC

## 2018-10-21 MED ORDER — DOCUSATE SODIUM 100 MG PO CAPS
100.0000 mg | ORAL_CAPSULE | Freq: Two times a day (BID) | ORAL | Status: DC
  Administered 2018-10-21: 100 mg via ORAL
  Filled 2018-10-21: qty 1

## 2018-10-21 MED ORDER — LIDOCAINE-EPINEPHRINE 1 %-1:100000 IJ SOLN
INTRAMUSCULAR | Status: DC | PRN
  Administered 2018-10-21: 8 mL

## 2018-10-21 MED ORDER — ROCURONIUM BROMIDE 50 MG/5ML IV SOSY
PREFILLED_SYRINGE | INTRAVENOUS | Status: AC
  Filled 2018-10-21: qty 15

## 2018-10-21 MED ORDER — PHENOL 1.4 % MT LIQD
1.0000 | OROMUCOSAL | Status: DC | PRN
  Filled 2018-10-21: qty 177

## 2018-10-21 MED ORDER — METOPROLOL TARTRATE 12.5 MG HALF TABLET: 12.5000 mg | ORAL_TABLET | Freq: Every day | ORAL | Status: DC

## 2018-10-21 SURGICAL SUPPLY — 59 items
ADH SKN CLS APL DERMABOND .7 (GAUZE/BANDAGES/DRESSINGS) ×1
BAG DECANTER FOR FLEXI CONT (MISCELLANEOUS) ×2 IMPLANT
BLADE CLIPPER SURG (BLADE) ×1 IMPLANT
BLADE SURG 11 STRL SS (BLADE) ×2 IMPLANT
BUR MATCHSTICK NEURO 3.0 LAGG (BURR) ×1 IMPLANT
BUR PRECISION FLUTE 5.0 (BURR) ×1 IMPLANT
CANISTER SUCT 3000ML PPV (MISCELLANEOUS) ×2 IMPLANT
COVER WAND RF STERILE (DRAPES) ×2 IMPLANT
DECANTER SPIKE VIAL GLASS SM (MISCELLANEOUS) ×2 IMPLANT
DERMABOND ADVANCED (GAUZE/BANDAGES/DRESSINGS) ×1
DERMABOND ADVANCED .7 DNX12 (GAUZE/BANDAGES/DRESSINGS) ×1 IMPLANT
DRAPE C-ARM 42X72 X-RAY (DRAPES) ×4 IMPLANT
DRAPE LAPAROTOMY T 98X78 PEDS (DRAPES) ×2 IMPLANT
DRAPE MICROSCOPE LEICA (MISCELLANEOUS) ×2 IMPLANT
DRAPE SURG 17X23 STRL (DRAPES) ×2 IMPLANT
DURAPREP 26ML APPLICATOR (WOUND CARE) ×2 IMPLANT
ELECT BLADE 6.5 EXT (BLADE) ×2 IMPLANT
ELECT REM PT RETURN 9FT ADLT (ELECTROSURGICAL) ×2
ELECTRODE REM PT RTRN 9FT ADLT (ELECTROSURGICAL) ×1 IMPLANT
GAUZE 4X4 16PLY RFD (DISPOSABLE) IMPLANT
GAUZE SPONGE 4X4 12PLY STRL (GAUZE/BANDAGES/DRESSINGS) IMPLANT
GLOVE BIO SURGEON STRL SZ7.5 (GLOVE) ×2 IMPLANT
GLOVE BIO SURGEON STRL SZ8 (GLOVE) ×1 IMPLANT
GLOVE BIOGEL PI IND STRL 7.5 (GLOVE) ×1 IMPLANT
GLOVE BIOGEL PI IND STRL 8 (GLOVE) IMPLANT
GLOVE BIOGEL PI INDICATOR 7.5 (GLOVE) ×1
GLOVE BIOGEL PI INDICATOR 8 (GLOVE) ×1
GLOVE ECLIPSE 7.5 STRL STRAW (GLOVE) ×4 IMPLANT
GLOVE EXAM NITRILE LRG STRL (GLOVE) IMPLANT
GLOVE EXAM NITRILE XL STR (GLOVE) IMPLANT
GLOVE EXAM NITRILE XS STR PU (GLOVE) IMPLANT
GOWN STRL REUS W/ TWL LRG LVL3 (GOWN DISPOSABLE) ×2 IMPLANT
GOWN STRL REUS W/ TWL XL LVL3 (GOWN DISPOSABLE) IMPLANT
GOWN STRL REUS W/TWL 2XL LVL3 (GOWN DISPOSABLE) ×1 IMPLANT
GOWN STRL REUS W/TWL LRG LVL3 (GOWN DISPOSABLE) ×2
GOWN STRL REUS W/TWL XL LVL3 (GOWN DISPOSABLE) ×2
HEMOSTAT POWDER KIT SURGIFOAM (HEMOSTASIS) ×2 IMPLANT
KIT BASIN OR (CUSTOM PROCEDURE TRAY) ×2 IMPLANT
KIT TURNOVER KIT B (KITS) ×2 IMPLANT
NDL HYPO 18GX1.5 BLUNT FILL (NEEDLE) IMPLANT
NDL SPNL 18GX3.5 QUINCKE PK (NEEDLE) IMPLANT
NEEDLE HYPO 18GX1.5 BLUNT FILL (NEEDLE) IMPLANT
NEEDLE HYPO 22GX1.5 SAFETY (NEEDLE) ×2 IMPLANT
NEEDLE SPNL 18GX3.5 QUINCKE PK (NEEDLE) ×2 IMPLANT
NS IRRIG 1000ML POUR BTL (IV SOLUTION) ×2 IMPLANT
PACK LAMINECTOMY NEURO (CUSTOM PROCEDURE TRAY) ×2 IMPLANT
PAD ARMBOARD 7.5X6 YLW CONV (MISCELLANEOUS) ×6 IMPLANT
PIN MAYFIELD SKULL DISP (PIN) ×1 IMPLANT
RUBBERBAND STERILE (MISCELLANEOUS) ×4 IMPLANT
SPONGE LAP 4X18 RFD (DISPOSABLE) IMPLANT
SUT MNCRL AB 3-0 PS2 18 (SUTURE) ×1 IMPLANT
SUT VIC AB 0 CT1 18XCR BRD8 (SUTURE) ×1 IMPLANT
SUT VIC AB 0 CT1 8-18 (SUTURE) ×2
SUT VIC AB 2-0 CP2 18 (SUTURE) ×1 IMPLANT
SUT VIC AB 4-0 RB1 18 (SUTURE) IMPLANT
SYR 3ML LL SCALE MARK (SYRINGE) IMPLANT
TOWEL GREEN STERILE (TOWEL DISPOSABLE) ×2 IMPLANT
TOWEL GREEN STERILE FF (TOWEL DISPOSABLE) ×2 IMPLANT
WATER STERILE IRR 1000ML POUR (IV SOLUTION) ×2 IMPLANT

## 2018-10-21 NOTE — Brief Op Note (Signed)
10/21/2018  3:37 PM  PATIENT:  Scott Valentine  63 y.o. male  PRE-OPERATIVE DIAGNOSIS:  Cervical radiculopathy  POST-OPERATIVE DIAGNOSIS:  Cervical radiculopathy  PROCEDURE:  Procedure(s): Right Cervical five-six Cervical six-seven Posterior cervical foraminotomies (Right)  SURGEON:  Surgeon(s) and Role:    * Judith Part, MD - Primary    * Kary Kos, MD - Assisting  PHYSICIAN ASSISTANT:   ANESTHESIA:   general  EBL:  25 mL   BLOOD ADMINISTERED:none  DRAINS: none   LOCAL MEDICATIONS USED:  LIDOCAINE   SPECIMEN:  No Specimen  DISPOSITION OF SPECIMEN:  N/A  COUNTS:  YES  TOURNIQUET:  * No tourniquets in log *  DICTATION: .Note written in EPIC  PLAN OF CARE: Admit for overnight observation  PATIENT DISPOSITION:  PACU - hemodynamically stable.   Delay start of Pharmacological VTE agent (>24hrs) due to surgical blood loss or risk of bleeding: yes

## 2018-10-21 NOTE — Transfer of Care (Signed)
Immediate Anesthesia Transfer of Care Note  Patient: Scott Valentine  Procedure(s) Performed: Right Cervical five-six Cervical six-seven Posterior cervical foraminotomies (Right Neck)  Patient Location: PACU  Anesthesia Type:General  Level of Consciousness: awake, alert  and oriented  Airway & Oxygen Therapy: Patient Spontanous Breathing and Patient connected to nasal cannula oxygen  Post-op Assessment: Report given to RN, Post -op Vital signs reviewed and stable and Patient moving all extremities X 4  Post vital signs: Reviewed and stable  Last Vitals:  Vitals Value Taken Time  BP 130/71 10/21/2018  3:45 PM  Temp 36.5 C 10/21/2018  3:45 PM  Pulse 75 10/21/2018  3:46 PM  Resp 19 10/21/2018  3:46 PM  SpO2 95 % 10/21/2018  3:46 PM  Vitals shown include unvalidated device data.  Last Pain:  Vitals:   10/21/18 1028  TempSrc:   PainSc: 2          Complications: No apparent anesthesia complications

## 2018-10-21 NOTE — Progress Notes (Signed)
Neurosurgery Service Post-operative progress note  Assessment & Plan: 63 y.o. man with cervical radiculopathy s/p right C5-6/C6-7 foraminotomies, seen in PACU, still recovering from anesthesia but MAEx4, states his right arm pain is currently resolved.  -admit to [facility] for obs overnight  Judith Part  10/21/18 4:41 PM

## 2018-10-21 NOTE — Anesthesia Procedure Notes (Signed)
Procedure Name: Intubation Date/Time: 10/21/2018 1:26 PM Performed by: Mariea Clonts, CRNA Pre-anesthesia Checklist: Patient identified, Emergency Drugs available, Suction available and Patient being monitored Patient Re-evaluated:Patient Re-evaluated prior to induction Oxygen Delivery Method: Circle System Utilized Preoxygenation: Pre-oxygenation with 100% oxygen Induction Type: IV induction Ventilation: Mask ventilation without difficulty and Oral airway inserted - appropriate to patient size Laryngoscope Size: Sabra Heck and 2 Grade View: Grade II Tube type: Oral Endobronchial tube: EBT position confirmed by auscultation Tube size: 7.5 mm Number of attempts: 1 Airway Equipment and Method: Stylet and Oral airway Placement Confirmation: ETT inserted through vocal cords under direct vision,  positive ETCO2 and breath sounds checked- equal and bilateral Tube secured with: Tape Dental Injury: Teeth and Oropharynx as per pre-operative assessment

## 2018-10-21 NOTE — Anesthesia Postprocedure Evaluation (Signed)
Anesthesia Post Note  Patient: Scott Valentine  Procedure(s) Performed: Right Cervical five-six Cervical six-seven Posterior cervical foraminotomies (Right Neck)     Patient location during evaluation: PACU Anesthesia Type: General Level of consciousness: sedated and patient cooperative Pain management: pain level controlled Vital Signs Assessment: post-procedure vital signs reviewed and stable Respiratory status: spontaneous breathing Cardiovascular status: stable Anesthetic complications: no    Last Vitals:  Vitals:   10/21/18 1615 10/21/18 1638  BP: 114/83 123/71  Pulse: 70 62  Resp: 16 18  Temp:  36.7 C  SpO2: 94% 97%    Last Pain:  Vitals:   10/21/18 1638  TempSrc: Oral  PainSc:                  [name]

## 2018-10-21 NOTE — Anesthesia Preprocedure Evaluation (Addendum)
Anesthesia Evaluation  Patient identified by MRN, date of birth, ID band Patient awake    Reviewed: Allergy & Precautions, NPO status , Patient's Chart, lab work & pertinent test results, reviewed documented beta blocker date and time   Airway Mallampati: II  TM Distance: >3 FB Neck ROM: Full    Dental  (+) Dental Advisory Given   Pulmonary neg pulmonary ROS, Current Smoker,    Pulmonary exam normal breath sounds clear to auscultation       Cardiovascular hypertension, Pt. on home beta blockers and Pt. on medications Normal cardiovascular exam Rhythm:Regular Rate:Normal     Neuro/Psych PSYCHIATRIC DISORDERS Anxiety  Neuromuscular disease    GI/Hepatic Neg liver ROS, PUD, GERD  ,  Endo/Other  diabetes, Type 2  Renal/GU negative Renal ROS     Musculoskeletal negative musculoskeletal ROS (+)   Abdominal   Peds  Hematology negative hematology ROS (+)   Anesthesia Other Findings   Reproductive/Obstetrics negative OB ROS                            Anesthesia Physical Anesthesia Plan  ASA: II  Anesthesia Plan: General   Post-op Pain Management:    Induction: Intravenous  PONV Risk Score and Plan: 2 and Ondansetron, Dexamethasone, Midazolam and Treatment may vary due to age or medical condition  Airway Management Planned: Oral ETT  Additional Equipment:   Intra-op Plan:   Post-operative Plan: Extubation in OR  Informed Consent: I have reviewed the patients History and Physical, chart, labs and discussed the procedure including the risks, benefits and alternatives for the proposed anesthesia with the patient or authorized representative who has indicated his/her understanding and acceptance.     Dental advisory given  Plan Discussed with: CRNA  Anesthesia Plan Comments:        Anesthesia Quick Evaluation

## 2018-10-21 NOTE — H&P (Signed)
Surgical H&P Update  HPI: 63 y.o. man with severe right sided pain, numbness, and weakness, here for right C5-6/6-7 posterior cervical foraminotomies. He has had severe right sided pain that did not respond to non-surgical treatment, MRI confirmed severe right sided cervical stenosis. No changes in health since she was last seen. Still having severe pain and wishes to proceed with surgery.  PMHx:  Past Medical History:  Diagnosis Date  . Allergy   . Anxiety state, unspecified 10/08/2007  . Cervical radiculopathy   . Cough 11/08/2009  . Diabetes mellitus without complication ([place])   . Dysmetabolic syndrome X 2/40/9735  . GERD 05/23/2008  . GLUCOSE INTOLERANCE 10/19/2008  . HYPERLIPIDEMIA 10/08/2007  . HYPERPLASIA PROSTATE UNS W/O UR OBST & OTH LUTS 10/08/2007  . Impaired glucose tolerance 12/30/2010  . Other and unspecified hyperlipidemia 05/01/2007  . Rectal bleeding   . 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 TOBACCO USE, QUIT 10/08/2007  . UNS ADVRS EFF UNS RX MEDICINAL&BIOLOGICAL SBSTNC 12/17/2007  . Unspecified essential hypertension 05/01/2007  . URI 10/29/2007   FamHx:  Family History  Problem Relation Age of Onset  . Diabetes Mother   . Mental illness Mother   . Heart disease Father        MI x 2  . Coronary artery disease Father 62  . Lung cancer Father        pneumonectomy  . Heart disease Brother        heart mumur  . Heart attack Paternal Grandfather   . Prostate cancer Paternal Grandfather   . Colon cancer Neg Hx    SocHx:  reports that he has been smoking. He has a 44.00 pack-year smoking history. He has never used smokeless tobacco. He reports that he does not drink alcohol or use drugs.  Physical Exam: AOx3, PERRL, FS, TM  Strength 5/5 x4 except 4/5 in right grip, triceps, biceps, SILTx4 except diffuse right hand numbness  Assesment/Plan: 63 y.o. man with right cervical radiculopathy, here for cervical foraminotomy. Risks, benefits, and alternatives discussed  and the patient would like to continue with surgery. -OR today -3C post-op  Judith Part, MD 10/21/18 1:11 PM

## 2018-10-21 NOTE — Op Note (Signed)
PATIENT: Scott Valentine  DAY OF SURGERY: 10/21/18   PRE-OPERATIVE DIAGNOSIS:  Cervical radiculopathy   POST-OPERATIVE DIAGNOSIS:  Cervical radiculopathy   PROCEDURE:  Right C5-6, C6-7 posterior cervical foraminotomies   SURGEON:  Surgeon(s) and Role:    Judith Part, MD - Primary    Kary Kos, MD - Assisting   ANESTHESIA: ETGA   BRIEF HISTORY: This is a 63 year old man who presented with severe right sided radicular pain that was not responsive to non-surgical measures. An MRI confirmed severe right C5-6 and C6-7 foraminal stenosis. This was discussed with the patient as well as risks, benefits, and alternatives and wished to proceed with surgical treatment.   OPERATIVE DETAIL: The patient was taken to the operating room and anesthesia was induced by the anesthesia team. [place] head holder was placed and the patient was then placed prone on the OR table. A formal time out was performed with two patient identifiers and confirmed the operative site. Anesthesia was induced by the anesthesia team. The operative site was marked, hair was clipped with surgical clippers, the area was then prepped and draped in a sterile fashion. Fluoroscopy was used to confirm the surgical level and a linear incision was placed in the cervical midline to expose C5 through C7. Dissection was performed to the right of midline in a subperiosteal fashion. Fluoroscopy was again used to confirm the surgical level after exposure. Using a high speed drill and kerrison rongeurs, a C5-6 laminotomy and medial facetectomy were performed and the foramen was followed and decompressed laterally until no residual stenosis was present. This was then repeated at C6-7 on the right. With no residual palpable stenosis, the incision was copiously irrigated, all instrument and sponge counts were correct, the incision was then closed in layers. The patient was then returned to anesthesia for emergence. No apparent complications at  the completion of the procedure.   EBL:  28mL   DRAINS: none   SPECIMENS: none   Judith Part, MD 10/21/18 3:37 PM

## 2018-10-22 ENCOUNTER — Encounter (HOSPITAL_COMMUNITY): Payer: Self-pay | Admitting: Neurological Surgery

## 2018-10-22 DIAGNOSIS — M5412 Radiculopathy, cervical region: Secondary | ICD-10-CM | POA: Diagnosis not present

## 2018-10-22 LAB — GLUCOSE, CAPILLARY: Glucose-Capillary: 212 mg/dL — ABNORMAL HIGH (ref 70–99)

## 2018-10-22 MED ORDER — OXYCODONE HCL 5 MG PO TABS: 5.0000 mg | ORAL_TABLET | ORAL | 0 refills | Status: DC | PRN

## 2018-10-22 NOTE — Discharge Summary (Signed)
Discharge Summary  Date of Admission: 10/21/2018  Date of Discharge: 10/22/18  Attending Physician: Emelda Brothers, MD  Hospital Course: Patient was admitted following an uncomplicated right P2-1/K2-4 posterior cervical foraminotomies for cervical radiculopathy. He was recovered in PACU and transferred to [facility] for overnight observation. His preoperative radicular pain was completely resolved after surgery. His hospital course was uncomplicated and the patient was discharged home on 10/22/2018. He will follow up in clinic with me in 2 weeks.  Neurologic exam at discharge:  AOx3, PERRL, EOMI, FS, TM Strength 5/5 x4 except for 4/5 in R grip, B/T, SILTx4 except for R hand numbness  Discharge diagnosis: Cervical radiculopathy  Judith Part, MD 10/22/18 7:25 AM

## 2018-10-22 NOTE — Progress Notes (Signed)
Patient is discharged from room 3C05 at this time. Alert and in stable condition. IV site d/c'd and instructions read to patient and spouse with understanding verbalized. Left unit via wheelchair with all belongings at side. 

## 2018-10-22 NOTE — Progress Notes (Signed)
OT Evaluation  Completed education regarding cervical precautions and ADL and functional mobility for ADL.     10/22/18 1200  OT Visit Information  Last OT Received On 10/22/18  Assistance Needed +1  History of Present Illness Pt is a 63 y/o male who presents s/p C5-C7 posterior laminectomy on 10/21/2018. PMH significant for HTN, DM.  Precautions  Precautions Fall;Cervical  Precaution Booklet Issued Yes (comment)  Precaution Comments Reviewed handout with pt and wife. Pt was cued for precautions during functional mobility.   Required Braces or Orthoses  ("no brace needed" order)  Restrictions  Weight Bearing Restrictions No  Home Living  Family/patient expects to be discharged to: Private residence  Living Arrangements Spouse/significant other  Available Help at Discharge Family;Available 24 hours/day  Type of [place] One level  Bathroom Shower/Tub Walk-in shower  Bathroom Toilet Handicapped height  Bathroom Accessibility Yes  How Accessible Accessible via walker  [place] held shower head;Shower seat - built in;Cane - single point  Prior Function  Level of Independence Independent  Comments Very active per pt and wife report  Communication  Communication No difficulties  Pain Assessment  Pain Assessment 0-10  Pain Score 2  Pain Location Incision site  Pain Descriptors / Indicators Operative site guarding  Pain Intervention(s) Limited activity within patient's tolerance  Cognition  Arousal/Alertness Awake/alert  Behavior During Therapy WFL for tasks assessed/performed  Overall Cognitive Status Within Functional Limits for tasks assessed  Upper Extremity Assessment  Upper Extremity Assessment RUE deficits/detail  RUE Deficits / Details minimally diminished grip and pinch but functional  Lower Extremity Assessment  Lower Extremity Assessment Overall WFL for tasks assessed  Cervical / Trunk Assessment  Cervical /  Trunk Assessment Other exceptions  Cervical / Trunk Exceptions s/p surgery  ADL  Overall ADL's  Needs assistance/impaired  Functional mobility during ADLs Independent  General ADL Comments Educated pt on cervical precautions during ADL. Pt/wife able to return demonstrate. Handout reviewed during ADL session.   Bed Mobility  Overal bed mobility Modified Independent  General bed mobility comments HOB flat and rails lowered to simulate home environment.   Transfers  Overall transfer level Modified independent  Equipment used None  General transfer comment Increased time, but pt able to power-up to full stand without assistance. No unsteadiness or LOB noted.   Balance  Overall balance assessment Needs assistance;Modified Independent  Exercises  Exercises Other exercises  Other Exercises  Other Exercises issued squeeze ball for pinch and grip strengthening  OT - End of Session  Activity Tolerance Patient tolerated treatment well  Patient left in chair;with call bell/phone within reach;with family/visitor present  Nurse Communication Mobility status;Other (comment) (DC plan)  OT Assessment  OT Recommendation/Assessment Patient does not need any further OT services  OT Visit Diagnosis Pain;Muscle weakness (generalized) (M62.81)  Pain - Right/Left Right  Pain - part of body  (neck)  OT Problem List Decreased strength;Pain  AM-PAC OT "6 Clicks" Daily Activity Outcome Measure (Version 2)  Help from another person eating meals? 4  Help from another person taking care of personal grooming? 4  Help from another person toileting, which includes using toliet, bedpan, or urinal? 4  Help from another person bathing (including washing, rinsing, drying)? 4  Help from another person to put on and taking off regular upper body clothing? 4  Help from another person to put on and taking off regular lower body clothing? 4  6 Click Score 24  OT Recommendation  Follow Up Recommendations No OT follow  up;Supervision - Intermittent  OT Equipment None recommended by OT  Acute Rehab OT Goals  Patient Stated Goal Home today  OT Goal Formulation All assessment and education complete, DC therapy  OT Time Calculation  OT Start Time (ACUTE ONLY) 0902  OT Stop Time (ACUTE ONLY) 0921  OT Time Calculation (min) 19 min  OT General Charges  $OT Visit 1 Visit  OT Evaluation  $OT Eval Low Complexity 1 Low  Written Expression  Dominant Hand Right  Maurie Boettcher, OT/L   Acute OT Clinical Specialist Acute Rehabilitation Services Pager [phone] Office [phone]

## 2018-10-22 NOTE — Evaluation (Signed)
Physical Therapy Evaluation and Discharge Patient Details Name: Scott Valentine MRN: [id number] DOB: 1956/03/02 Today's Date: 10/22/2018   History of Present Illness  Pt is a 63 y/o male who presents s/p C5-C7 posterior laminectomy on 10/21/2018. PMH significant for HTN, DM.  Clinical Impression  Patient evaluated by Physical Therapy with no further acute PT needs identified. All education has been completed and the patient has no further questions. At the time of PT eval pt was able to perform transfers and ambulation with gross supervision for safety to modified independence without an AD. Pt was educated on precautions, car transfer, activity progression and positioning recommendations. See below for any follow-up Physical Therapy or equipment needs. PT is signing off. Thank you for this referral.     Follow Up Recommendations No PT follow up;Supervision - Intermittent    Equipment Recommendations  None recommended by PT    Recommendations for Other Services       Precautions / Restrictions Precautions Precautions: Fall;Cervical Precaution Booklet Issued: Yes (comment) Precaution Comments: Reviewed handout with pt and wife. Pt was cued for precautions during functional mobility.  Required Braces or Orthoses: ("no brace needed" order) Restrictions Weight Bearing Restrictions: No      Mobility  Bed Mobility Overal bed mobility: Modified Independent             General bed mobility comments: HOB flat and rails lowered to simulate home environment.   Transfers Overall transfer level: Modified independent Equipment used: None             General transfer comment: Increased time, but pt able to power-up to full stand without assistance. No unsteadiness or LOB noted.   Ambulation/Gait Ambulation/Gait assistance: Supervision;Modified independent (Device/Increase time) Gait Distance (Feet): 300 Feet Assistive device: None Gait Pattern/deviations: Step-through  pattern;Decreased stride length;Trunk flexed Gait velocity: Decreased Gait velocity interpretation: 1.31 - 2.62 ft/sec, indicative of limited community ambulator General Gait Details: VC's for improved posture throughout gait training. Pt able to make corrective changes but unable to maintain. Generally slow but steady throughout.   Stairs            Wheelchair Mobility    Modified Rankin (Stroke Patients Only)       Balance Overall balance assessment: Needs assistance Sitting-balance support: Feet supported;No upper extremity supported Sitting balance-Leahy Scale: Fair     Standing balance support: No upper extremity supported;During functional activity Standing balance-Leahy Scale: Fair                               Pertinent Vitals/Pain Pain Assessment: Faces Faces Pain Scale: Hurts little more Pain Location: Incision site Pain Descriptors / Indicators: Operative site guarding Pain Intervention(s): Monitored during session;Repositioned    Home Living Family/patient expects to be discharged to:: Private residence Living Arrangements: Spouse/significant other Available Help at Discharge: Family;Available 24 hours/day Type of Home: House Home Access: Ramped entrance     Home Layout: One level Home Equipment: Hand held shower head;Shower seat - built in;Cane - single point      Prior Function Level of Independence: Independent         Comments: Very active per pt and wife report     Hand Dominance        Extremity/Trunk Assessment   Upper Extremity Assessment Upper Extremity Assessment: RUE deficits/detail RUE Deficits / Details: Decreased strength and sensation of fingertips consistent with pre-op diagnosis    Lower Extremity Assessment Lower Extremity  Assessment: Overall WFL for tasks assessed    Cervical / Trunk Assessment Cervical / Trunk Assessment: Other exceptions Cervical / Trunk Exceptions: s/p surgery  Communication    Communication: No difficulties  Cognition Arousal/Alertness: Awake/alert Behavior During Therapy: WFL for tasks assessed/performed Overall Cognitive Status: Within Functional Limits for tasks assessed                                        General Comments      Exercises     Assessment/Plan    PT Assessment Patent does not need any further PT services  PT Problem List         PT Treatment Interventions      PT Goals (Current goals can be found in the Care Plan section)  Acute Rehab PT Goals Patient Stated Goal: Home today PT Goal Formulation: All assessment and education complete, DC therapy    Frequency     Barriers to discharge        Co-evaluation               AM-PAC PT "6 Clicks" Mobility  Outcome Measure Help needed turning from your back to your side while in a flat bed without using bedrails?: None Help needed moving from lying on your back to sitting on the side of a flat bed without using bedrails?: None Help needed moving to and from a bed to a chair (including a wheelchair)?: None Help needed standing up from a chair using your arms (e.g., wheelchair or bedside chair)?: None Help needed to walk in hospital room?: A Little Help needed climbing 3-5 steps with a railing? : A Little 6 Click Score: 22    End of Session Equipment Utilized During Treatment: Gait belt Activity Tolerance: Patient tolerated treatment well Patient left: in chair;with call bell/phone within reach;with family/visitor present Nurse Communication: Mobility status PT Visit Diagnosis: Pain;Other symptoms and signs involving the nervous system (R29.898) Pain - part of body: (neck)    Time: 1610-9604 PT Time Calculation (min) (ACUTE ONLY): 19 min   Charges:   PT Evaluation $PT Eval Low Complexity: 1 Low          Rolinda Roan, PT, DPT Acute Rehabilitation Services Pager: [phone] Office: [phone]   Thelma Comp 10/22/2018, 10:46 AM

## 2018-10-23 DIAGNOSIS — Z0279 Encounter for issue of other medical certificate: Secondary | ICD-10-CM

## 2018-11-03 ENCOUNTER — Other Ambulatory Visit: Payer: Self-pay | Admitting: Internal Medicine

## 2018-12-20 ENCOUNTER — Other Ambulatory Visit: Payer: Self-pay | Admitting: Internal Medicine

## 2018-12-21 NOTE — Telephone Encounter (Signed)
Done erx 

## 2018-12-29 DIAGNOSIS — L84 Corns and callosities: Secondary | ICD-10-CM | POA: Diagnosis not present

## 2018-12-29 DIAGNOSIS — B351 Tinea unguium: Secondary | ICD-10-CM | POA: Diagnosis not present

## 2018-12-29 DIAGNOSIS — E1142 Type 2 diabetes mellitus with diabetic polyneuropathy: Secondary | ICD-10-CM | POA: Diagnosis not present

## 2019-01-15 ENCOUNTER — Other Ambulatory Visit (INDEPENDENT_AMBULATORY_CARE_PROVIDER_SITE_OTHER): Payer: 59

## 2019-01-15 DIAGNOSIS — Z125 Encounter for screening for malignant neoplasm of prostate: Secondary | ICD-10-CM | POA: Diagnosis not present

## 2019-01-15 DIAGNOSIS — Z0000 Encounter for general adult medical examination without abnormal findings: Secondary | ICD-10-CM | POA: Diagnosis not present

## 2019-01-15 DIAGNOSIS — E119 Type 2 diabetes mellitus without complications: Secondary | ICD-10-CM | POA: Diagnosis not present

## 2019-01-15 LAB — URINALYSIS, ROUTINE W REFLEX MICROSCOPIC
Bilirubin Urine: NEGATIVE
Hgb urine dipstick: NEGATIVE
Ketones, ur: NEGATIVE
Leukocytes,Ua: NEGATIVE
Nitrite: NEGATIVE
RBC / HPF: NONE SEEN (ref 0–?)
Specific Gravity, Urine: 1.02 (ref 1.000–1.030)
Total Protein, Urine: NEGATIVE
Urine Glucose: >=1000 — AB
Urobilinogen, UA: 0.2 (ref 0.0–1.0)
pH: 6 (ref 5.0–8.0)

## 2019-01-15 LAB — HEPATIC FUNCTION PANEL
ALT: 14 U/L (ref 0–53)
AST: 16 U/L (ref 0–37)
Albumin: 4.6 g/dL (ref 3.5–5.2)
Alkaline Phosphatase: 43 U/L (ref 39–117)
Bilirubin, Direct: 0.1 mg/dL (ref 0.0–0.3)
Total Bilirubin: 0.5 mg/dL (ref 0.2–1.2)
Total Protein: 7.2 g/dL (ref 6.0–8.3)

## 2019-01-15 LAB — LIPID PANEL
Cholesterol: 159 mg/dL (ref 0–200)
HDL: 37 mg/dL — ABNORMAL LOW (ref >39.00)
NonHDL: 122.45
Total CHOL/HDL Ratio: 4
Triglycerides: 205 mg/dL — ABNORMAL HIGH (ref 0.0–149.0)
VLDL: 41 mg/dL — ABNORMAL HIGH (ref 0.0–40.0)

## 2019-01-15 LAB — CBC WITH DIFFERENTIAL/PLATELET
Basophils Absolute: 0.1 10*3/uL (ref 0.0–0.1)
Basophils Relative: 0.8 % (ref 0.0–3.0)
Eosinophils Absolute: 0.2 10*3/uL (ref 0.0–0.7)
Eosinophils Relative: 3.1 % (ref 0.0–5.0)
HCT: 45.7 % (ref 39.0–52.0)
Hemoglobin: 15.8 g/dL (ref 13.0–17.0)
Lymphocytes Relative: 19 % (ref 12.0–46.0)
Lymphs Abs: 1.4 10*3/uL (ref 0.7–4.0)
MCHC: 34.7 g/dL (ref 30.0–36.0)
MCV: 91.2 fl (ref 78.0–100.0)
Monocytes Absolute: 0.6 10*3/uL (ref 0.1–1.0)
Monocytes Relative: 8.3 % (ref 3.0–12.0)
Neutro Abs: 5.1 10*3/uL (ref 1.4–7.7)
Neutrophils Relative %: 68.8 % (ref 43.0–77.0)
Platelets: 231 10*3/uL (ref 150.0–400.0)
RBC: 5.01 Mil/uL (ref 4.22–5.81)
RDW: 13 % (ref 11.5–15.5)
WBC: 7.4 10*3/uL (ref 4.0–10.5)

## 2019-01-15 LAB — MICROALBUMIN / CREATININE URINE RATIO
Creatinine,U: 57.1 mg/dL
Microalb Creat Ratio: 1.2 mg/g (ref 0.0–30.0)
Microalb, Ur: <0.7 mg/dL (ref 0.0–1.9)

## 2019-01-15 LAB — BASIC METABOLIC PANEL
BUN: 20 mg/dL (ref 6–23)
CO2: 26 mEq/L (ref 19–32)
Calcium: 9.2 mg/dL (ref 8.4–10.5)
Chloride: 101 mEq/L (ref 96–112)
Creatinine, Ser: 0.94 mg/dL (ref 0.40–1.50)
GFR: 81.57 mL/min (ref >60.00)
Glucose, Bld: 149 mg/dL — ABNORMAL HIGH (ref 70–99)
Potassium: 4 mEq/L (ref 3.5–5.1)
Sodium: 138 mEq/L (ref 135–145)

## 2019-01-15 LAB — PSA: PSA: 1.01 ng/mL (ref 0.10–4.00)

## 2019-01-15 LAB — TSH: TSH: 2.54 u[IU]/mL (ref 0.35–4.50)

## 2019-01-15 LAB — HEMOGLOBIN A1C: Hgb A1c MFr Bld: 7.1 % — ABNORMAL HIGH (ref 4.6–6.5)

## 2019-01-15 LAB — LDL CHOLESTEROL, DIRECT: Direct LDL: 99 mg/dL

## 2019-01-20 ENCOUNTER — Telehealth: Payer: Self-pay | Admitting: Internal Medicine

## 2019-01-20 NOTE — Telephone Encounter (Signed)
Please follow up with patient in regard to appointment scheduled tomorrow.  Patient is not able to complete a virtual visit.  Patient states Dr. Jenny Reichmann will probably want to extend the follow up on his diabetes until his CPE is due.

## 2019-01-20 NOTE — Telephone Encounter (Signed)
Spoke with pt, he does have cell with video capabilities.   I have sent pt up for a Doxy instead of an in office.

## 2019-01-22 ENCOUNTER — Ambulatory Visit (INDEPENDENT_AMBULATORY_CARE_PROVIDER_SITE_OTHER): Payer: 59 | Admitting: Internal Medicine

## 2019-01-22 ENCOUNTER — Encounter: Payer: Self-pay | Admitting: Internal Medicine

## 2019-01-22 DIAGNOSIS — E538 Deficiency of other specified B group vitamins: Secondary | ICD-10-CM | POA: Diagnosis not present

## 2019-01-22 DIAGNOSIS — E559 Vitamin D deficiency, unspecified: Secondary | ICD-10-CM

## 2019-01-22 DIAGNOSIS — Z0000 Encounter for general adult medical examination without abnormal findings: Secondary | ICD-10-CM

## 2019-01-22 DIAGNOSIS — E119 Type 2 diabetes mellitus without complications: Secondary | ICD-10-CM

## 2019-01-22 DIAGNOSIS — E611 Iron deficiency: Secondary | ICD-10-CM

## 2019-01-22 NOTE — Progress Notes (Signed)
Patient ID: Scott Valentine, male   DOB: Mar 20, 1956, 63 y.o.   MRN: [id number]  Virtual Visit via Video Note  I connected with Lurline Hare on 01/22/19 at  9:20 AM EDT by a video enabled telemedicine application and verified that I am speaking with the correct person using two identifiers.  Location: Patient: at his car Provider: at office   I discussed the limitations of evaluation and management by telemedicine and the availability of in person appointments. The patient expressed understanding and agreed to proceed.  History of Present Illness: Here for wellness and f/u;  Overall doing ok;  Pt denies Chest pain, worsening SOB, DOE, wheezing, orthopnea, PND, worsening LE edema, palpitations, dizziness or syncope.  Pt denies neurological change such as new headache, facial or extremity weakness.  Pt denies polydipsia, polyuria, or low sugar symptoms. Pt states overall good compliance with treatment and medications, good tolerability, and has been trying to follow appropriate diet.  Pt denies worsening depressive symptoms, suicidal ideation or panic. No fever, night sweats, wt loss, loss of appetite, or other constitutional symptoms.  Pt states good ability with ADL's, has low fall risk, home safety reviewed and adequate, no other significant changes in hearing or vision, and only occasionally active with exercise. Back to full time at work but light duty per Dr Zada Finders. Lost 25 lbs since dec 2019 neck surgury, but he is not overly concerned.  Appetite ok now, trying to not regain the wt.  .    Past Medical History:  Diagnosis Date  . Allergy   . Anxiety state, unspecified 10/08/2007  . Cervical radiculopathy   . Cough 11/08/2009  . Diabetes mellitus without complication ([place])   . Dysmetabolic syndrome X 9/37/9024  . GERD 05/23/2008  . GLUCOSE INTOLERANCE 10/19/2008  . HYPERLIPIDEMIA 10/08/2007  . HYPERPLASIA PROSTATE UNS W/O UR OBST & OTH LUTS 10/08/2007  . Impaired glucose tolerance  12/30/2010  . Other and unspecified hyperlipidemia 05/01/2007  . Rectal bleeding   . 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 TOBACCO USE, QUIT 10/08/2007  . UNS ADVRS EFF UNS RX MEDICINAL&BIOLOGICAL SBSTNC 12/17/2007  . Unspecified essential hypertension 05/01/2007  . URI 10/29/2007   Past Surgical History:  Procedure Laterality Date  . ANAL FISSURE REPAIR  04/25/11  . finger surgury' Left 05/30/2008   3rd finger  . HEMORRHOID SURGERY  04/25/11  . POSTERIOR CERVICAL LAMINECTOMY Right 10/21/2018   Procedure: Right Cervical five-six Cervical six-seven Posterior cervical foraminotomies;  Surgeon: Judith Part, MD;  Location: [place];  Service: Neurosurgery;  Laterality: Right;    reports that he has been smoking. He has a 44.00 pack-year smoking history. He has never used smokeless tobacco. He reports that he does not drink alcohol or use drugs. family history includes Coronary artery disease (age of onset: 33) in his father; Diabetes in his mother; Heart attack in his paternal grandfather; Heart disease in his brother and father; Lung cancer in his father; Mental illness in his mother; Prostate cancer in his paternal grandfather. Allergies  Allergen Reactions  . Sulfonamide Derivatives Other (See Comments)    stevens johnson  . Atorvastatin Other (See Comments)    myalgias  . Ezetimibe Other (See Comments)    myalgias   Current Outpatient Medications on File Prior to Visit  Medication Sig Dispense Refill  . blood glucose meter kit and supplies Dispense based on patient and insurance preference. Use up to four times daily as directed. (E11.9). 1 each 0  . cyclobenzaprine (  FLEXERIL) 10 MG tablet Take 0.5-1 tablets (5-10 mg total) by mouth 2 (two) times daily as needed for muscle spasms. 120 tablet 2  . empagliflozin (JARDIANCE) 25 MG TABS tablet Take 25 mg by mouth daily. 90 tablet 3  . escitalopram (LEXAPRO) 20 MG tablet TAKE 1 TABLET BY MOUTH ONCE DAILY (Patient taking differently:  Take 20 mg by mouth daily. ) 30 tablet 11  . fenofibrate 160 MG tablet TAKE 1 TABLET BY MOUTH ONCE DAILY (Patient taking differently: Take 160 mg by mouth daily. ) 30 tablet 11  . fexofenadine (ALLEGRA) 180 MG tablet Take 1 tablet (180 mg total) by mouth daily as needed. (Patient taking differently: Take 180 mg by mouth daily. ) 90 tablet 3  . folic acid (FOLVITE) 1 MG tablet Take 1 tablet (1 mg total) by mouth daily. 90 tablet 3  . gabapentin (NEURONTIN) 100 MG capsule Take 1 capsule (100 mg total) by mouth 3 (three) times daily. 90 capsule 5  . glipiZIDE (GLUCOTROL XL) 10 MG 24 hr tablet TAKE 1 TABLET BY MOUTH ONCE DAILY WITH BREAKFAST (Patient taking differently: Take 10 mg by mouth daily with breakfast. ) 30 tablet 11  . glucose blood (ONE TOUCH ULTRA TEST) test strip Use as directed three times per day E11.9 300 each 12  . HYDROcodone-acetaminophen (NORCO/VICODIN) 5-325 MG tablet Take 1 tablet by mouth every 6 (six) hours as needed for moderate pain. 120 tablet 0  . Lancets MISC Use as directed three times per day E11.9 300 each 11  . LORazepam (ATIVAN) 0.5 MG tablet TAKE 1 TABLET BY MOUTH ONCE DAILY AS NEEDED 90 tablet 1  . losartan (COZAAR) 25 MG tablet Take 1 tablet (25 mg total) by mouth 2 (two) times daily. (Patient taking differently: Take 50 mg by mouth daily. ) 180 tablet 3  . magnesium oxide (MAG-OX) 400 MG tablet Take 400 mg by mouth daily.    . metFORMIN (GLUCOPHAGE-XR) 500 MG 24 hr tablet TAKE 4 TABLETS BY MOUTH EVERY MORNING (Patient taking differently: Take 2,000 mg by mouth daily with breakfast. ) 120 tablet 11  . metoprolol tartrate (LOPRESSOR) 25 MG tablet TAKE 1/2 (ONE-HALF) TABLET BY MOUTH ONCE DAILY 55 tablet 1  . Multiple Vitamin (MULTIVITAMIN) tablet Take 1 tablet by mouth daily.      Marland Kitchen oxyCODONE (OXY IR/ROXICODONE) 5 MG immediate release tablet Take 1 tablet (5 mg total) by mouth every 4 (four) hours as needed (pain). 30 tablet 0  . rosuvastatin (CRESTOR) 40 MG tablet TAKE  1 TABLET BY MOUTH ONCE DAILY (Patient taking differently: Take 40 mg by mouth at bedtime. ) 30 tablet 11  . vitamin C (ASCORBIC ACID) 500 MG tablet Take 500 mg by mouth daily.     No current facility-administered medications on file prior to visit.    Observations/Objective: Alert, NAD, appropriate mood and affect, resps normal, cn 2-12 intact, moves all 4s, no visible rash or swelling Lab Results  Component Value Date   WBC 7.4 01/15/2019   HGB 15.8 01/15/2019   HCT 45.7 01/15/2019   PLT 231.0 01/15/2019   GLUCOSE 149 (H) 01/15/2019   CHOL 159 01/15/2019   TRIG 205.0 (H) 01/15/2019   HDL 37.00 (L) 01/15/2019   LDLDIRECT 99.0 01/15/2019   LDLCALC 79 10/09/2018   ALT 14 01/15/2019   AST 16 01/15/2019   NA 138 01/15/2019   K 4.0 01/15/2019   CL 101 01/15/2019   CREATININE 0.94 01/15/2019   BUN 20 01/15/2019   CO2  26 01/15/2019   TSH 2.54 01/15/2019   PSA 1.01 01/15/2019   HGBA1C 7.1 (H) 01/15/2019   MICROALBUR <0.7 01/15/2019   Assessment and Plan: See notes  Follow Up Instructions: See notes   I discussed the assessment and treatment plan with the patient. The patient was provided an opportunity to ask questions and all were answered. The patient agreed with the plan and demonstrated an understanding of the instructions.   The patient was advised to call back or seek an in-person evaluation if the symptoms worsen or if the condition fails to improve as anticipated.  Cathlean Cower, MD

## 2019-01-22 NOTE — Patient Instructions (Addendum)
Please continue all other medications as before, and refills have been done if requested.  Please have the pharmacy call with any other refills you may need.  Please continue your efforts at being more active, low cholesterol diet, and weight control.  You are otherwise up to date with prevention measures today.  Please keep your appointments with your specialists as you may have planned  Please return Apr 16 2019, or sooner if needed, with Lab testing done 3-5 days before

## 2019-01-23 ENCOUNTER — Encounter: Payer: Self-pay | Admitting: Internal Medicine

## 2019-01-23 NOTE — Assessment & Plan Note (Signed)
stable overall by history and exam, recent data reviewed with pt, and pt to continue medical treatment as before,  to f/u any worsening symptoms or concerns  

## 2019-01-23 NOTE — Assessment & Plan Note (Signed)

## 2019-04-09 ENCOUNTER — Other Ambulatory Visit (INDEPENDENT_AMBULATORY_CARE_PROVIDER_SITE_OTHER): Payer: 59

## 2019-04-09 ENCOUNTER — Ambulatory Visit (INDEPENDENT_AMBULATORY_CARE_PROVIDER_SITE_OTHER): Payer: 59 | Admitting: Internal Medicine

## 2019-04-09 ENCOUNTER — Encounter: Payer: Self-pay | Admitting: Internal Medicine

## 2019-04-09 VITALS — BP 136/84 | HR 64 | Temp 98.5°F | Ht 69.0 in

## 2019-04-09 DIAGNOSIS — M25562 Pain in left knee: Secondary | ICD-10-CM | POA: Insufficient documentation

## 2019-04-09 DIAGNOSIS — E559 Vitamin D deficiency, unspecified: Secondary | ICD-10-CM | POA: Diagnosis not present

## 2019-04-09 DIAGNOSIS — E538 Deficiency of other specified B group vitamins: Secondary | ICD-10-CM

## 2019-04-09 DIAGNOSIS — E611 Iron deficiency: Secondary | ICD-10-CM

## 2019-04-09 DIAGNOSIS — E119 Type 2 diabetes mellitus without complications: Secondary | ICD-10-CM

## 2019-04-09 DIAGNOSIS — I10 Essential (primary) hypertension: Secondary | ICD-10-CM | POA: Diagnosis not present

## 2019-04-09 DIAGNOSIS — M4802 Spinal stenosis, cervical region: Secondary | ICD-10-CM

## 2019-04-09 LAB — VITAMIN D 25 HYDROXY (VIT D DEFICIENCY, FRACTURES): VITD: 55.43 ng/mL (ref 30.00–100.00)

## 2019-04-09 LAB — BASIC METABOLIC PANEL
BUN: 19 mg/dL (ref 6–23)
CO2: 26 mEq/L (ref 19–32)
Calcium: 9.8 mg/dL (ref 8.4–10.5)
Chloride: 102 mEq/L (ref 96–112)
Creatinine, Ser: 1.06 mg/dL (ref 0.40–1.50)
GFR: 70.95 mL/min (ref >60.00)
Glucose, Bld: 135 mg/dL — ABNORMAL HIGH (ref 70–99)
Potassium: 4.6 mEq/L (ref 3.5–5.1)
Sodium: 138 mEq/L (ref 135–145)

## 2019-04-09 LAB — HEPATIC FUNCTION PANEL
ALT: 13 U/L (ref 0–53)
AST: 17 U/L (ref 0–37)
Albumin: 4.8 g/dL (ref 3.5–5.2)
Alkaline Phosphatase: 42 U/L (ref 39–117)
Bilirubin, Direct: 0.1 mg/dL (ref 0.0–0.3)
Total Bilirubin: 0.5 mg/dL (ref 0.2–1.2)
Total Protein: 7.9 g/dL (ref 6.0–8.3)

## 2019-04-09 LAB — IBC PANEL
Iron: 93 ug/dL (ref 42–165)
Saturation Ratios: 19.7 % — ABNORMAL LOW (ref 20.0–50.0)
Transferrin: 338 mg/dL (ref 212.0–360.0)

## 2019-04-09 LAB — LIPID PANEL
Cholesterol: 153 mg/dL (ref 0–200)
HDL: 34.5 mg/dL — ABNORMAL LOW (ref >39.00)
NonHDL: 118.21
Total CHOL/HDL Ratio: 4
Triglycerides: 206 mg/dL — ABNORMAL HIGH (ref 0.0–149.0)
VLDL: 41.2 mg/dL — ABNORMAL HIGH (ref 0.0–40.0)

## 2019-04-09 LAB — HEMOGLOBIN A1C: Hgb A1c MFr Bld: 7.4 % — ABNORMAL HIGH (ref 4.6–6.5)

## 2019-04-09 LAB — LDL CHOLESTEROL, DIRECT: Direct LDL: 93 mg/dL

## 2019-04-09 LAB — VITAMIN B12: Vitamin B-12: 393 pg/mL (ref 211–911)

## 2019-04-09 MED ORDER — JARDIANCE 25 MG PO TABS: 25.0000 mg | ORAL_TABLET | Freq: Every day | ORAL | 3 refills | Status: DC

## 2019-04-09 NOTE — Progress Notes (Signed)
Subjective:    Patient ID: Scott Valentine, male    DOB: Dec 20, 1955, 63 y.o.   MRN: [id number]  HPI  Here to f/u; overall doing ok,  Pt denies chest pain, increasing sob or doe, wheezing, orthopnea, PND, increased LE swelling, palpitations, dizziness or syncope.  Pt denies new neurological symptoms such as new headache, or facial or extremity weakness or numbness.  Pt denies polydipsia, polyuria, or low sugar episode.  Pt states overall good compliance with meds, mostly trying to follow appropriate diet, with wt overall stable,  but little exercise however. BP Readings from Last 3 Encounters:  04/09/19 136/84  10/22/18 121/69  10/16/18 124/76   Wt Readings from Last 3 Encounters:  10/21/18 201 lb (91.2 kg)  10/16/18 203 lb (92.1 kg)  10/14/18 201 lb 14.4 oz (91.6 kg)  Does building inspecting onsite for new construction, was inspecting a home footing, stepped in to a footing hold and had a funny feeling to the post left knee, which got worse after in the next hour and had to hold onto a cart to do some shopping at [employer]. Has been considered workmans comp for this, seen per [facility] health Dr Mila Merry.  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   Past Medical History:  Diagnosis Date  . Allergy   . Anxiety state, unspecified 10/08/2007  . Cervical radiculopathy   . Cough 11/08/2009  . Diabetes mellitus without complication ([place])   . Dysmetabolic syndrome X 11/20/2765  . GERD 05/23/2008  . GLUCOSE INTOLERANCE 10/19/2008  . HYPERLIPIDEMIA 10/08/2007  . HYPERPLASIA PROSTATE UNS W/O UR OBST & OTH LUTS 10/08/2007  . Impaired glucose tolerance 12/30/2010  . Other and unspecified hyperlipidemia 05/01/2007  . Rectal bleeding   . 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 TOBACCO USE, QUIT 10/08/2007  . UNS ADVRS EFF UNS RX MEDICINAL&BIOLOGICAL SBSTNC 12/17/2007  . Unspecified essential hypertension 05/01/2007  . URI 10/29/2007   Past Surgical History:   Procedure Laterality Date  . ANAL FISSURE REPAIR  04/25/11  . finger surgury' Left 05/30/2008   3rd finger  . HEMORRHOID SURGERY  04/25/11  . POSTERIOR CERVICAL LAMINECTOMY Right 10/21/2018   Procedure: Right Cervical five-six Cervical six-seven Posterior cervical foraminotomies;  Surgeon: Judith Part, MD;  Location: [place];  Service: Neurosurgery;  Laterality: Right;    reports that he has been smoking. He has a 44.00 pack-year smoking history. He has never used smokeless tobacco. He reports that he does not drink alcohol or use drugs. family history includes Coronary artery disease (age of onset: 41) in his father; Diabetes in his mother; Heart attack in his paternal grandfather; Heart disease in his brother and father; Lung cancer in his father; Mental illness in his mother; Prostate cancer in his paternal grandfather. Allergies  Allergen Reactions  . Sulfonamide Derivatives Other (See Comments)    stevens johnson  . Atorvastatin Other (See Comments)    myalgias  . Ezetimibe Other (See Comments)    myalgias   Current Outpatient Medications on File Prior to Visit  Medication Sig Dispense Refill  . blood glucose meter kit and supplies Dispense based on patient and insurance preference. Use up to four times daily as directed. (E11.9). 1 each 0  . cyclobenzaprine (FLEXERIL) 10 MG tablet Take 0.5-1 tablets (5-10 mg total) by mouth 2 (two) times daily as needed for muscle spasms. 120 tablet 2  . escitalopram (LEXAPRO) 20 MG tablet TAKE  1 TABLET BY MOUTH ONCE DAILY (Patient taking differently: Take 20 mg by mouth daily. ) 30 tablet 11  . fenofibrate 160 MG tablet TAKE 1 TABLET BY MOUTH ONCE DAILY (Patient taking differently: Take 160 mg by mouth daily. ) 30 tablet 11  . fexofenadine (ALLEGRA) 180 MG tablet Take 1 tablet (180 mg total) by mouth daily as needed. (Patient taking differently: Take 180 mg by mouth daily. ) 90 tablet 3  . folic acid (FOLVITE) 1 MG tablet Take 1 tablet (1 mg  total) by mouth daily. 90 tablet 3  . gabapentin (NEURONTIN) 100 MG capsule Take 1 capsule (100 mg total) by mouth 3 (three) times daily. 90 capsule 5  . glipiZIDE (GLUCOTROL XL) 10 MG 24 hr tablet TAKE 1 TABLET BY MOUTH ONCE DAILY WITH BREAKFAST (Patient taking differently: Take 10 mg by mouth daily with breakfast. ) 30 tablet 11  . glucose blood (ONE TOUCH ULTRA TEST) test strip Use as directed three times per day E11.9 300 each 12  . HYDROcodone-acetaminophen (NORCO/VICODIN) 5-325 MG tablet Take 1 tablet by mouth every 6 (six) hours as needed for moderate pain. 120 tablet 0  . Lancets MISC Use as directed three times per day E11.9 300 each 11  . LORazepam (ATIVAN) 0.5 MG tablet TAKE 1 TABLET BY MOUTH ONCE DAILY AS NEEDED 90 tablet 1  . losartan (COZAAR) 25 MG tablet Take 1 tablet (25 mg total) by mouth 2 (two) times daily. (Patient taking differently: Take 50 mg by mouth daily. ) 180 tablet 3  . magnesium oxide (MAG-OX) 400 MG tablet Take 400 mg by mouth daily.    . metFORMIN (GLUCOPHAGE-XR) 500 MG 24 hr tablet TAKE 4 TABLETS BY MOUTH EVERY MORNING (Patient taking differently: Take 2,000 mg by mouth daily with breakfast. ) 120 tablet 11  . metoprolol tartrate (LOPRESSOR) 25 MG tablet TAKE 1/2 (ONE-HALF) TABLET BY MOUTH ONCE DAILY 55 tablet 1  . Multiple Vitamin (MULTIVITAMIN) tablet Take 1 tablet by mouth daily.      Marland Kitchen oxyCODONE (OXY IR/ROXICODONE) 5 MG immediate release tablet Take 1 tablet (5 mg total) by mouth every 4 (four) hours as needed (pain). 30 tablet 0  . rosuvastatin (CRESTOR) 40 MG tablet TAKE 1 TABLET BY MOUTH ONCE DAILY (Patient taking differently: Take 40 mg by mouth at bedtime. ) 30 tablet 11  . vitamin C (ASCORBIC ACID) 500 MG tablet Take 500 mg by mouth daily.     No current facility-administered medications on file prior to visit.    Review of Systems  Constitutional: Negative for other unusual diaphoresis or sweats HENT: Negative for ear discharge or swelling Eyes:  Negative for other worsening visual disturbances Respiratory: Negative for stridor or other swelling  Gastrointestinal: Negative for worsening distension or other blood Genitourinary: Negative for retention or other urinary change Musculoskeletal: Negative for other MSK pain or swelling Skin: Negative for color change or other new lesions Neurological: Negative for worsening tremors and other numbness  Psychiatric/Behavioral: Negative for worsening agitation or other fatigue All other system neg per pt    Objective:   Physical Exam BP 136/84   Pulse 64   Temp 98.5 F (36.9 C) (Oral)   Ht '5\' 9"'  (1.753 m)   SpO2 97%   BMI 29.68 kg/m  VS noted,  Constitutional: Pt appears in NAD HENT: Head: NCAT.  Right Ear: External ear normal.  Left Ear: External ear normal.  Eyes: . Pupils are equal, round, and reactive to light. Conjunctivae and EOM  are normal Nose: without d/c or deformity Neck: Neck supple. Gross normal ROM Cardiovascular: Normal rate and regular rhythm.   Pulmonary/Chest: Effort normal and breath sounds without rales or wheezing.  Abd:  Soft, NT, ND, + BS, no organomegaly Tender post left knee at the lateral post tendon Neurological: Pt is alert. At baseline orientation, motor grossly intact Skin: Skin is warm. No rashes, other new lesions, no LE edema Psychiatric: Pt behavior is normal without agitation  No other exam findings  Lab Results  Component Value Date   WBC 7.4 01/15/2019   HGB 15.8 01/15/2019   HCT 45.7 01/15/2019   PLT 231.0 01/15/2019   GLUCOSE 149 (H) 01/15/2019   CHOL 159 01/15/2019   TRIG 205.0 (H) 01/15/2019   HDL 37.00 (L) 01/15/2019   LDLDIRECT 99.0 01/15/2019   LDLCALC 79 10/09/2018   ALT 14 01/15/2019   AST 16 01/15/2019   NA 138 01/15/2019   K 4.0 01/15/2019   CL 101 01/15/2019   CREATININE 0.94 01/15/2019   BUN 20 01/15/2019   CO2 26 01/15/2019   TSH 2.54 01/15/2019   PSA 1.01 01/15/2019   HGBA1C 7.1 (H) 01/15/2019   MICROALBUR  <0.7 01/15/2019       Assessment & Plan:

## 2019-04-09 NOTE — Assessment & Plan Note (Signed)
Improved pain, pt pleased,  to f/u any worsening symptoms or concerns

## 2019-04-09 NOTE — Assessment & Plan Note (Signed)
[place] for handicapped parking application signed today,  to f/u any worsening symptoms or concerns

## 2019-04-09 NOTE — Assessment & Plan Note (Signed)
stable overall by history and exam, recent data reviewed with pt, and pt to continue medical treatment as before,  to f/u any worsening symptoms or concerns  

## 2019-04-09 NOTE — Patient Instructions (Signed)
Please continue all other medications as before, and refills have been done if requested.  Please have the pharmacy call with any other refills you may need.  Please continue your efforts at being more active, low cholesterol diet, and weight control.  Please keep your appointments with your specialists as you may have planned  You are given the handicap parking application form signed

## 2019-04-16 ENCOUNTER — Ambulatory Visit (HOSPITAL_COMMUNITY)
Admission: RE | Admit: 2019-04-16 | Discharge: 2019-04-16 | Disposition: A | Discharge status: Home / Self Care | Payer: 59 | Source: Ambulatory Visit | Attending: Internal Medicine | Admitting: Internal Medicine

## 2019-04-16 ENCOUNTER — Encounter: Payer: Self-pay | Admitting: Internal Medicine

## 2019-04-16 ENCOUNTER — Telehealth: Payer: Self-pay

## 2019-04-16 ENCOUNTER — Other Ambulatory Visit: Payer: Self-pay

## 2019-04-16 ENCOUNTER — Ambulatory Visit (INDEPENDENT_AMBULATORY_CARE_PROVIDER_SITE_OTHER): Payer: 59 | Admitting: Internal Medicine

## 2019-04-16 ENCOUNTER — Encounter: Payer: Self-pay | Admitting: Family

## 2019-04-16 VITALS — BP 124/78 | HR 73 | Temp 98.1°F | Ht 69.0 in | Wt 212.0 lb

## 2019-04-16 DIAGNOSIS — M7989 Other specified soft tissue disorders: Secondary | ICD-10-CM | POA: Insufficient documentation

## 2019-04-16 DIAGNOSIS — M25562 Pain in left knee: Secondary | ICD-10-CM

## 2019-04-16 DIAGNOSIS — M79662 Pain in left lower leg: Secondary | ICD-10-CM

## 2019-04-16 DIAGNOSIS — E119 Type 2 diabetes mellitus without complications: Secondary | ICD-10-CM

## 2019-04-16 DIAGNOSIS — I10 Essential (primary) hypertension: Secondary | ICD-10-CM | POA: Diagnosis not present

## 2019-04-16 MED ORDER — HYDROCODONE-ACETAMINOPHEN 7.5-325 MG PO TABS: 1.0000 | ORAL_TABLET | Freq: Four times a day (QID) | ORAL | 0 refills | Status: DC | PRN

## 2019-04-16 NOTE — Telephone Encounter (Signed)
Pt has been informed of results and expressed understanding.  °

## 2019-04-16 NOTE — Progress Notes (Signed)
Subjective:    Patient ID: Scott Valentine, male    DOB: [birth year], 63 y.o.   MRN: [id number]  HPI  Here to f/u with c/o worsening sharp and dull achy left knee pain and swelling now hobbling markedly with a cane, all since his initial injury July 29, has been trying to work but has to go home early several hours in the last few days; has fortunately been allowed to park closer.  Has been using crutches at home as well.  Left knee pain now mod to quite occasionally severe, also has a new tender area to the prox distal leg medially.  No fever, trauma, falls, or giveaways.  Notes he has no help from city [employer] as he was denied approval.  Pt denies chest pain, increased sob or doe, wheezing, orthopnea, PND, increased LE swelling, palpitations, dizziness or syncope.  Pt denies new neurological symptoms such as new headache, or facial or extremity weakness or numbness   Pt denies polydipsia, polyuria, or low sugar symptoms such as weakness or confusion improved with po intake.  Pt states overall good compliance with meds, trying to follow lower cholesterol, diabetic diet, wt overall stable but little exercise however.   No prior hx of DVT Past Medical History:  Diagnosis Date  . Allergy   . Anxiety state, unspecified 10/08/2007  . Cervical radiculopathy   . Cough 11/08/2009  . Diabetes mellitus without complication ([place])   . Dysmetabolic syndrome X 01/09/8881  . GERD 05/23/2008  . GLUCOSE INTOLERANCE 10/19/2008  . HYPERLIPIDEMIA 10/08/2007  . HYPERPLASIA PROSTATE UNS W/O UR OBST & OTH LUTS 10/08/2007  . Impaired glucose tolerance 12/30/2010  . Other and unspecified hyperlipidemia 05/01/2007  . Rectal bleeding   . 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 TOBACCO USE, QUIT 10/08/2007  . UNS ADVRS EFF UNS RX MEDICINAL&BIOLOGICAL SBSTNC 12/17/2007  . Unspecified essential hypertension 05/01/2007  . URI 10/29/2007   Past Surgical History:  Procedure Laterality Date  . ANAL FISSURE REPAIR  04/25/11   . finger surgury' Left 05/30/2008   3rd finger  . HEMORRHOID SURGERY  04/25/11  . POSTERIOR CERVICAL LAMINECTOMY Right 10/21/2018   Procedure: Right Cervical five-six Cervical six-seven Posterior cervical foraminotomies;  Surgeon: Judith Part, MD;  Location: [facility];  Service: Neurosurgery;  Laterality: Right;    reports that he has been smoking. He has a 44.00 pack-year smoking history. He has never used smokeless tobacco. He reports that he does not drink alcohol or use drugs. family history includes Coronary artery disease (age of onset: 43) in his father; Diabetes in his mother; Heart attack in his paternal grandfather; Heart disease in his brother and father; Lung cancer in his father; Mental illness in his mother; Prostate cancer in his paternal grandfather. Allergies  Allergen Reactions  . Sulfonamide Derivatives Other (See Comments)    stevens johnson  . Atorvastatin Other (See Comments)    myalgias  . Ezetimibe Other (See Comments)    myalgias   Current Outpatient Medications on File Prior to Visit  Medication Sig Dispense Refill  . blood glucose meter kit and supplies Dispense based on patient and insurance preference. Use up to four times daily as directed. (E11.9). 1 each 0  . cyclobenzaprine (FLEXERIL) 10 MG tablet Take 0.5-1 tablets (5-10 mg total) by mouth 2 (two) times daily as needed for muscle spasms. 120 tablet 2  . empagliflozin (JARDIANCE) 25 MG TABS tablet Take 25 mg by mouth daily. 90 tablet 3  .  escitalopram (LEXAPRO) 20 MG tablet TAKE 1 TABLET BY MOUTH ONCE DAILY (Patient taking differently: Take 20 mg by mouth daily. ) 30 tablet 11  . fenofibrate 160 MG tablet TAKE 1 TABLET BY MOUTH ONCE DAILY (Patient taking differently: Take 160 mg by mouth daily. ) 30 tablet 11  . fexofenadine (ALLEGRA) 180 MG tablet Take 1 tablet (180 mg total) by mouth daily as needed. (Patient taking differently: Take 180 mg by mouth daily. ) 90 tablet 3  . folic acid (FOLVITE) 1 MG tablet  Take 1 tablet (1 mg total) by mouth daily. 90 tablet 3  . gabapentin (NEURONTIN) 100 MG capsule Take 1 capsule (100 mg total) by mouth 3 (three) times daily. 90 capsule 5  . glipiZIDE (GLUCOTROL XL) 10 MG 24 hr tablet TAKE 1 TABLET BY MOUTH ONCE DAILY WITH BREAKFAST (Patient taking differently: Take 10 mg by mouth daily with breakfast. ) 30 tablet 11  . glucose blood (ONE TOUCH ULTRA TEST) test strip Use as directed three times per day E11.9 300 each 12  . Lancets MISC Use as directed three times per day E11.9 300 each 11  . LORazepam (ATIVAN) 0.5 MG tablet TAKE 1 TABLET BY MOUTH ONCE DAILY AS NEEDED 90 tablet 1  . losartan (COZAAR) 25 MG tablet Take 1 tablet (25 mg total) by mouth 2 (two) times daily. (Patient taking differently: Take 50 mg by mouth daily. ) 180 tablet 3  . magnesium oxide (MAG-OX) 400 MG tablet Take 400 mg by mouth daily.    . metFORMIN (GLUCOPHAGE-XR) 500 MG 24 hr tablet TAKE 4 TABLETS BY MOUTH EVERY MORNING (Patient taking differently: Take 2,000 mg by mouth daily with breakfast. ) 120 tablet 11  . metoprolol tartrate (LOPRESSOR) 25 MG tablet TAKE 1/2 (ONE-HALF) TABLET BY MOUTH ONCE DAILY 55 tablet 1  . Multiple Vitamin (MULTIVITAMIN) tablet Take 1 tablet by mouth daily.      Marland Kitchen oxyCODONE (OXY IR/ROXICODONE) 5 MG immediate release tablet Take 1 tablet (5 mg total) by mouth every 4 (four) hours as needed (pain). 30 tablet 0  . rosuvastatin (CRESTOR) 40 MG tablet TAKE 1 TABLET BY MOUTH ONCE DAILY (Patient taking differently: Take 40 mg by mouth at bedtime. ) 30 tablet 11  . vitamin C (ASCORBIC ACID) 500 MG tablet Take 500 mg by mouth daily.     No current facility-administered medications on file prior to visit.    Review of Systems  Constitutional: Negative for other unusual diaphoresis or sweats HENT: Negative for ear discharge or swelling Eyes: Negative for other worsening visual disturbances Respiratory: Negative for stridor or other swelling  Gastrointestinal: Negative  for worsening distension or other blood Genitourinary: Negative for retention or other urinary change Musculoskeletal: Negative for other MSK pain or swelling Skin: Negative for color change or other new lesions Neurological: Negative for worsening tremors and other numbness  Psychiatric/Behavioral: Negative for worsening agitation or other fatigue All other system neg per pt    Objective:   Physical Exam BP 124/78   Pulse 73   Temp 98.1 F (36.7 C) (Oral)   Ht _0  (1.753 m)   Wt 212 lb (96.2 kg)   SpO2 99%   BMI 31.31 kg/m  VS noted, in pain, hobbling severe with cane due to left knee pain Constitutional: Pt appears in NAD HENT: Head: NCAT.  Right Ear: External ear normal.  Left Ear: External ear normal.  Eyes: . Pupils are equal, round, and reactive to light. Conjunctivae and EOM are  normal Nose: without d/c or deformity Neck: Neck supple. Gross normal ROM Cardiovascular: Normal rate and regular rhythm.   Pulmonary/Chest: Effort normal and breath sounds without rales or wheezing.  Left knee with 3+ effusion and mod tender with some swelling to the distal leg and foot as well, and a tender area to the prox distal leg just below the knee medial aspect, no definite cord palpated Neurological: Pt is alert. At baseline orientation, motor grossly intact Skin: Skin is warm. No rashes, other new lesions, no LE edema Psychiatric: Pt behavior is normal without agitation  No other exam findings Lab Results  Component Value Date   WBC 7.4 01/15/2019   HGB 15.8 01/15/2019   HCT 45.7 01/15/2019   PLT 231.0 01/15/2019   GLUCOSE 135 (H) 04/09/2019   CHOL 153 04/09/2019   TRIG 206.0 (H) 04/09/2019   HDL 34.50 (L) 04/09/2019   LDLDIRECT 93.0 04/09/2019   LDLCALC 79 10/09/2018   ALT 13 04/09/2019   AST 17 04/09/2019   NA 138 04/09/2019   K 4.6 04/09/2019   CL 102 04/09/2019   CREATININE 1.06 04/09/2019   BUN 19 04/09/2019   CO2 26 04/09/2019   TSH 2.54 01/15/2019   PSA 1.01  01/15/2019   HGBA1C 7.4 (H) 04/09/2019   MICROALBUR <0.7 01/15/2019      Assessment & Plan:

## 2019-04-16 NOTE — Patient Instructions (Addendum)
Please take all new medication as prescribed - the pain medication  Please note we are only allowed for 1 week of pain medication to start by law;  Please call in 1 wk if you need further medication  Please continue all other medications as before, and refills have been done if requested.  Please have the pharmacy call with any other refills you may need.  Please continue your efforts at being more active, low cholesterol diet, and weight control.  Please keep your appointments with your specialists as you may have planned  You will be contacted regarding the referral for: Leg circulation testing today to make sure no blood clots  You will be contacted regarding the referral for: Dr Tamala Julian specifically for the left knee  You are given the work note for HALF days only until May 03, 2019  Please return in 3 months, or sooner if needed

## 2019-04-16 NOTE — Telephone Encounter (Signed)
-----   Message from Biagio Borg, MD sent at 04/16/2019  1:03 PM EDT ----- Left message on MyChart, pt to cont same tx  The test results show that your current treatment is OK, as there was no DVT (deep vein blood clot) seen.  There was a small area of superficial phlebitis (a superficial blood clot in a vein) which can cause your pain, but does not cause further problems, and does not need other treatment by itself.  You should still plan to see Dr Tamala Julian for the left knee pain.  Shirron to please inform pt, cont same tx

## 2019-04-17 ENCOUNTER — Encounter: Payer: Self-pay | Admitting: Internal Medicine

## 2019-04-17 NOTE — Assessment & Plan Note (Signed)
S/p injury with worsening pain and swelling with time and ambulation; suspect significant knee injury including meniscal tearing or ligamentous injury; for pain control, and refer sports medicine

## 2019-04-17 NOTE — Assessment & Plan Note (Signed)
stable overall by history and exam, recent data reviewed with pt, and pt to continue medical treatment as before,  to f/u any worsening symptoms or concerns  

## 2019-04-17 NOTE — Assessment & Plan Note (Signed)
Large, cant r/o DVT - for stat venous doppler u/s

## 2019-04-19 NOTE — Telephone Encounter (Signed)
No specific tx needed as it is only superficial, ok for tylenol prn, cont all other meds and may take 4-6 wks for the pain to resolve with healing

## 2019-04-19 NOTE — Telephone Encounter (Signed)
Patient's wife calling requesting a call back to discuss continuatin of care regarding blood clot. Prefers a call before 12pm today.

## 2019-04-19 NOTE — Telephone Encounter (Signed)
Pt has already been informed of results and expressed understanding. Please advise if you have further recommendations based on his wife's request.

## 2019-04-20 NOTE — Telephone Encounter (Signed)
Pt's wife has been informed.  

## 2019-05-19 ENCOUNTER — Encounter: Payer: Self-pay | Admitting: Family Medicine

## 2019-05-19 ENCOUNTER — Other Ambulatory Visit: Payer: Self-pay

## 2019-05-19 ENCOUNTER — Ambulatory Visit (INDEPENDENT_AMBULATORY_CARE_PROVIDER_SITE_OTHER): Payer: 59 | Admitting: Family Medicine

## 2019-05-19 ENCOUNTER — Ambulatory Visit: Payer: Self-pay

## 2019-05-19 VITALS — BP 142/72 | HR 72 | Ht 69.0 in | Wt 212.0 lb

## 2019-05-19 DIAGNOSIS — M25562 Pain in left knee: Secondary | ICD-10-CM | POA: Diagnosis not present

## 2019-05-19 DIAGNOSIS — G8929 Other chronic pain: Secondary | ICD-10-CM

## 2019-05-19 NOTE — Assessment & Plan Note (Signed)
Secondary to more of the popliteus tendinitis.  We discussed with patient about a compression sleeve, topical anti-inflammatories, icing regimen.  We discussed avoiding certain full extension or flexion of the knee if possible.  Patient does have what appears to be a very small degenerative tear of the medial meniscus that could also be contributing and if continues to have pain will consider x-rays and formal physical therapy.  Follow-up with me again in 4 to 6 weeks if not completely resolved

## 2019-05-19 NOTE — Patient Instructions (Signed)
Compression sleeve Ice 20 min 2x daily Exercises 3x a week Pennsaid 2x a day, finger tip sized amount See me in 5-6 weeks

## 2019-05-19 NOTE — Progress Notes (Signed)
[facility] [address], [place] [address] Phone: [phone] Subjective:   Scott Valentine, am serving as a scribe for Dr. Hulan Valentine.  Referred for evaluation Scott Borg, MD  CC:  Left knee pain  [id number]  Scott Valentine is a 63 y.o. male coming in with complaint of left knee pain. Had blood clot at end of July. Dr. Jenny Valentine wanted him to follow up with [place]. Patient is having pain in posterior aspect, 2/10. Not having to use any medication for pain. Does work as an Agricultural consultant. Tried light duty for 2 weeks. Was in so much pain from the clot he was using a cane and crutches.  Patient states now doing much better.  Patient states that he just wanted to make sure that he is okay to increase activity as tolerated.    Past Medical History:  Diagnosis Date  . Allergy   . Anxiety state, unspecified 10/08/2007  . Cervical radiculopathy   . Cough 11/08/2009  . Diabetes mellitus without complication ([place])   . Dysmetabolic syndrome X 3/79/0240  . GERD 05/23/2008  . GLUCOSE INTOLERANCE 10/19/2008  . HYPERLIPIDEMIA 10/08/2007  . HYPERPLASIA PROSTATE UNS W/O UR OBST & OTH LUTS 10/08/2007  . Impaired glucose tolerance 12/30/2010  . Other and unspecified hyperlipidemia 05/01/2007  . Rectal bleeding   . 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 TOBACCO USE, QUIT 10/08/2007  . UNS ADVRS EFF UNS RX MEDICINAL&BIOLOGICAL SBSTNC 12/17/2007  . Unspecified essential hypertension 05/01/2007  . URI 10/29/2007   Past Surgical History:  Procedure Laterality Date  . ANAL FISSURE REPAIR  04/25/11  . finger surgury' Left 05/30/2008   3rd finger  . HEMORRHOID SURGERY  04/25/11  . POSTERIOR CERVICAL LAMINECTOMY Right 10/21/2018   Procedure: Right Cervical five-six Cervical six-seven Posterior cervical foraminotomies;  Surgeon: Scott Part, MD;  Location: [facility];  Service: Neurosurgery;  Laterality: Right;   Social History   Socioeconomic History  . Marital  status: Married    Spouse name: Not on file  . Number of children: 3  . Years of education: Not on file  . Highest education level: Not on file  Occupational History  . 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 Financial resource strain: Not on file  . Food insecurity    Worry: Not on file    Inability: Not on file  . Transportation needs    Medical: Not on file    Non-medical: Not on file  Tobacco Use  . Smoking status: Current Every Day Smoker    Packs/day: 1.00    Years: 44.00    Pack years: 44.00  . Smokeless tobacco: Never Used  . Tobacco comment: Chantix  Substance and Sexual Activity  . Alcohol use: Valentine    Alcohol/week: 0.0 standard drinks  . Drug use: Valentine  . Sexual activity: Not on file  Lifestyle  . Physical activity    Days per week: Not on file    Minutes per session: Not on file  . Stress: Not on file  Relationships  . Social Herbalist on phone: Not on file    Gets together: Not on file    Attends religious service: Not on file    Active member of club or organization: Not on file    Attends meetings of clubs or organizations: Not on file    Relationship status: Not on file  Other Topics Concern  . Not  on file  Social History Narrative  . Not on file   Allergies  Allergen Reactions  . Sulfonamide Derivatives Other (See Comments)    stevens johnson  . Atorvastatin Other (See Comments)    myalgias  . Ezetimibe Other (See Comments)    myalgias   Family History  Problem Relation Age of Onset  . Diabetes Mother   . Mental illness Mother   . Heart disease Father        MI x 2  . Coronary artery disease Father 58  . Lung cancer Father        pneumonectomy  . Heart disease Brother        heart mumur  . Heart attack Paternal Grandfather   . Prostate cancer Paternal Grandfather   . Colon cancer Neg Hx     Current Outpatient Medications (Endocrine & Metabolic):  .  empagliflozin (JARDIANCE) 25 MG TABS tablet, Take 25 mg by mouth  daily. Marland Kitchen  glipiZIDE (GLUCOTROL XL) 10 MG 24 hr tablet, TAKE 1 TABLET BY MOUTH ONCE DAILY WITH BREAKFAST (Patient taking differently: Take 10 mg by mouth daily with breakfast. ) .  metFORMIN (GLUCOPHAGE-XR) 500 MG 24 hr tablet, TAKE 4 TABLETS BY MOUTH EVERY MORNING (Patient taking differently: Take 2,000 mg by mouth daily with breakfast. )  Current Outpatient Medications (Cardiovascular):  .  fenofibrate 160 MG tablet, TAKE 1 TABLET BY MOUTH ONCE DAILY (Patient taking differently: Take 160 mg by mouth daily. ) .  losartan (COZAAR) 25 MG tablet, Take 1 tablet (25 mg total) by mouth 2 (two) times daily. (Patient taking differently: Take 50 mg by mouth daily. ) .  metoprolol tartrate (LOPRESSOR) 25 MG tablet, TAKE 1/2 (ONE-HALF) TABLET BY MOUTH ONCE DAILY .  rosuvastatin (CRESTOR) 40 MG tablet, TAKE 1 TABLET BY MOUTH ONCE DAILY (Patient taking differently: Take 40 mg by mouth at bedtime. )  Current Outpatient Medications (Respiratory):  .  fexofenadine (ALLEGRA) 180 MG tablet, Take 1 tablet (180 mg total) by mouth daily as needed. (Patient taking differently: Take 180 mg by mouth daily. )  Current Outpatient Medications (Analgesics):  .  HYDROcodone-acetaminophen (NORCO) 7.5-325 MG tablet, Take 1 tablet by mouth every 6 (six) hours as needed for moderate pain. Marland Kitchen  oxyCODONE (OXY IR/ROXICODONE) 5 MG immediate release tablet, Take 1 tablet (5 mg total) by mouth every 4 (four) hours as needed (pain).  Current Outpatient Medications (Hematological):  .  folic acid (FOLVITE) 1 MG tablet, Take 1 tablet (1 mg total) by mouth daily.  Current Outpatient Medications (Other):  .  blood glucose meter kit and supplies, Dispense based on patient and insurance preference. Use up to four times daily as directed. (E11.9). .  cyclobenzaprine (FLEXERIL) 10 MG tablet, Take 0.5-1 tablets (5-10 mg total) by mouth 2 (two) times daily as needed for muscle spasms. Marland Kitchen  escitalopram (LEXAPRO) 20 MG tablet, TAKE 1 TABLET BY  MOUTH ONCE DAILY (Patient taking differently: Take 20 mg by mouth daily. ) .  gabapentin (NEURONTIN) 100 MG capsule, Take 1 capsule (100 mg total) by mouth 3 (three) times daily. Marland Kitchen  glucose blood (ONE TOUCH ULTRA TEST) test strip, Use as directed three times per day E11.9 .  Lancets MISC, Use as directed three times per day E11.9 .  LORazepam (ATIVAN) 0.5 MG tablet, TAKE 1 TABLET BY MOUTH ONCE DAILY AS NEEDED .  magnesium oxide (MAG-OX) 400 MG tablet, Take 400 mg by mouth daily. .  Multiple Vitamin (MULTIVITAMIN) tablet, Take 1 tablet by  mouth daily.   .  vitamin C (ASCORBIC ACID) 500 MG tablet, Take 500 mg by mouth daily.    Past medical history, social, surgical and family history all reviewed in electronic medical record.  Valentine pertanent information unless stated regarding to the chief complaint.   Review of Systems:  Valentine headache, visual changes, nausea, vomiting, diarrhea, constipation, dizziness, abdominal pain, skin rash, fevers, chills, night sweats, weight loss, swollen lymph nodes, body aches, joint swelling, chest pain, shortness of breath, mood changes.  Positive muscle aches  Objective  Blood pressure (!) 142/72, pulse 72, height 5' 9" (1.753 m), weight 212 lb (96.2 kg), SpO2 97 %.    General: Valentine apparent distress alert and oriented x3 mood and affect normal, dressed appropriately.  HEENT: Pupils equal, extraocular movements intact  Respiratory: Patient's speak in full sentences and does not appear short of breath  Cardiovascular: Valentine lower extremity edema, non tender, Valentine erythema  Skin: Warm dry intact with Valentine signs of infection or rash on extremities or on axial skeleton.  Abdomen: Soft nontender  Neuro: Cranial nerves II through XII are intact, neurovascularly intact in all extremities with 2+ DTRs and 2+ pulses.  Lymph: Valentine lymphadenopathy of posterior or anterior cervical chain or axillae bilaterally.  Gait normal with good balance and coordination.  MSK:  tender with full  range of motion and good stability and symmetric strength and tone of shoulders, elbows, wrist, hip, and ankles bilaterally.  Left knee exam has full range of motion.  Mild crepitus noted.  Patient has Valentine instability.  Mild tenderness along medial joint line in the popliteal area.  Limited musculoskeletal ultrasound was performed and interpreted Lyndal Pulley  Limited ultrasound of patient's popliteal area has hypoechoic changes within the tendon sheath laterally.  All veins compressible in the popliteal area.  Degenerative meniscal tear noted on the medial compartment.  Valentine significant displacement.    Impression and Recommendations:     This case required medical decision making of moderate complexity. The above documentation has been reviewed and is accurate and complete Lyndal Pulley, DO       Note: This dictation was prepared with Dragon dictation along with smaller phrase technology. Any transcriptional errors that result from this process are unintentional.

## 2019-05-23 ENCOUNTER — Other Ambulatory Visit: Payer: Self-pay | Admitting: Internal Medicine

## 2019-06-09 ENCOUNTER — Other Ambulatory Visit: Payer: Self-pay | Admitting: Internal Medicine

## 2019-06-23 ENCOUNTER — Other Ambulatory Visit: Payer: Self-pay | Admitting: Internal Medicine

## 2019-06-25 ENCOUNTER — Encounter: Payer: Self-pay | Admitting: Family Medicine

## 2019-06-25 ENCOUNTER — Other Ambulatory Visit: Payer: Self-pay

## 2019-06-25 ENCOUNTER — Ambulatory Visit (INDEPENDENT_AMBULATORY_CARE_PROVIDER_SITE_OTHER): Payer: 59 | Admitting: Family Medicine

## 2019-06-25 DIAGNOSIS — M25562 Pain in left knee: Secondary | ICD-10-CM

## 2019-06-25 NOTE — Assessment & Plan Note (Signed)
Significant improvement at this time.  Concern for may be a mild meniscal tear but will monitor.  With patient improving no significant change in management.  Follow-up in 2 months if not completely resolved

## 2019-06-25 NOTE — Patient Instructions (Signed)
Doing great Pennsaid 2x a day fingertip sized amount See me in 2 months if not perfect

## 2019-06-25 NOTE — Progress Notes (Signed)
[facility] [address] [place], [place] [address] Phone: [phone] Subjective:   Scott Valentine, am serving as a scribe for Dr. Hulan Saas.   CC: Left knee pain  LHT:[id number]   05/19/2019 Secondary to more of the popliteus tendinitis.  We discussed with patient about a compression sleeve, topical anti-inflammatories, icing regimen.  We discussed avoiding certain full extension or flexion of the knee if possible.  Patient does have what appears to be a very small degenerative tear of the medial meniscus that could also be contributing and if continues to have pain will consider x-rays and formal physical therapy.  Follow-up with me again in 4 to 6 weeks if not completely resolved  Update 06/25/2019 Scott Valentine is a 63 y.o. male coming in with complaint of left knee pain. Patient states that he has felt some improvement. Still has pain with FABER position in the medial hamstring tendons. Pain is at 1/10 after a day at work.  Patient would state  overall he is 95% better  Past Medical History:  Diagnosis Date  . Allergy   . Anxiety state, unspecified 10/08/2007  . Cervical radiculopathy   . Cough 11/08/2009  . Diabetes mellitus without complication ([place])   . Dysmetabolic syndrome X 04/03/2034  . GERD 05/23/2008  . GLUCOSE INTOLERANCE 10/19/2008  . HYPERLIPIDEMIA 10/08/2007  . HYPERPLASIA PROSTATE UNS W/O UR OBST & OTH LUTS 10/08/2007  . Impaired glucose tolerance 12/30/2010  . Other and unspecified hyperlipidemia 05/01/2007  . Rectal bleeding   . 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 TOBACCO USE, QUIT 10/08/2007  . UNS ADVRS EFF UNS RX MEDICINAL&BIOLOGICAL SBSTNC 12/17/2007  . Unspecified essential hypertension 05/01/2007  . URI 10/29/2007   Past Surgical History:  Procedure Laterality Date  . ANAL FISSURE REPAIR  04/25/11  . finger surgury' Left 05/30/2008   3rd finger  . HEMORRHOID SURGERY  04/25/11  . POSTERIOR CERVICAL LAMINECTOMY Right  10/21/2018   Procedure: Right Cervical five-six Cervical six-seven Posterior cervical foraminotomies;  Surgeon: Judith Part, MD;  Location: [facility];  Service: Neurosurgery;  Laterality: Right;   Social History   Socioeconomic History  . Marital status: Married    Spouse name: Not on file  . Number of children: 3  . Years of education: Not on file  . Highest education level: Not on file  Occupational History  . 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 Financial resource strain: Not on file  . Food insecurity    Worry: Not on file    Inability: Not on file  . Transportation needs    Medical: Not on file    Non-medical: Not on file  Tobacco Use  . Smoking status: Current Every Day Smoker    Packs/day: 1.00    Years: 44.00    Pack years: 44.00  . Smokeless tobacco: Never Used  . Tobacco comment: Chantix  Substance and Sexual Activity  . Alcohol use: Valentine    Alcohol/week: 0.0 standard drinks  . Drug use: Valentine  . Sexual activity: Not on file  Lifestyle  . Physical activity    Days per week: Not on file    Minutes per session: Not on file  . Stress: Not on file  Relationships  . Social Herbalist on phone: Not on file    Gets together: Not on file    Attends religious service: Not on file    Active member of club  or organization: Not on file    Attends meetings of clubs or organizations: Not on file    Relationship status: Not on file  Other Topics Concern  . Not on file  Social History Narrative  . Not on file   Allergies  Allergen Reactions  . Sulfonamide Derivatives Other (See Comments)    stevens johnson  . Atorvastatin Other (See Comments)    myalgias  . Ezetimibe Other (See Comments)    myalgias   Family History  Problem Relation Age of Onset  . Diabetes Mother   . Mental illness Mother   . Heart disease Father        MI x 2  . Coronary artery disease Father 80  . Lung cancer Father        pneumonectomy  . Heart disease Brother         heart mumur  . Heart attack Paternal Grandfather   . Prostate cancer Paternal Grandfather   . Colon cancer Neg Hx     Current Outpatient Medications (Endocrine & Metabolic):  .  empagliflozin (JARDIANCE) 25 MG TABS tablet, Take 25 mg by mouth daily. Marland Kitchen  glipiZIDE (GLUCOTROL XL) 10 MG 24 hr tablet, Take 1 tablet by mouth once daily with breakfast .  metFORMIN (GLUCOPHAGE-XR) 500 MG 24 hr tablet, TAKE 4 TABLETS BY MOUTH IN THE MORNING  Current Outpatient Medications (Cardiovascular):  .  fenofibrate 160 MG tablet, Take 1 tablet by mouth once daily .  losartan (COZAAR) 25 MG tablet, Take 1 tablet (25 mg total) by mouth 2 (two) times daily. (Patient taking differently: Take 50 mg by mouth daily. ) .  losartan (COZAAR) 50 MG tablet, Take 1 tablet by mouth once daily .  metoprolol tartrate (LOPRESSOR) 25 MG tablet, TAKE 1/2 (ONE-HALF) TABLET BY MOUTH ONCE DAILY .  rosuvastatin (CRESTOR) 40 MG tablet, Take 1 tablet by mouth once daily  Current Outpatient Medications (Respiratory):  .  fexofenadine (ALLEGRA) 180 MG tablet, Take 1 tablet (180 mg total) by mouth daily as needed. (Patient taking differently: Take 180 mg by mouth daily. )  Current Outpatient Medications (Analgesics):  .  HYDROcodone-acetaminophen (NORCO) 7.5-325 MG tablet, Take 1 tablet by mouth every 6 (six) hours as needed for moderate pain. Marland Kitchen  oxyCODONE (OXY IR/ROXICODONE) 5 MG immediate release tablet, Take 1 tablet (5 mg total) by mouth every 4 (four) hours as needed (pain).  Current Outpatient Medications (Hematological):  .  folic acid (FOLVITE) 1 MG tablet, Take 1 tablet (1 mg total) by mouth daily.  Current Outpatient Medications (Other):  .  blood glucose meter kit and supplies, Dispense based on patient and insurance preference. Use up to four times daily as directed. (E11.9). .  cyclobenzaprine (FLEXERIL) 10 MG tablet, Take 0.5-1 tablets (5-10 mg total) by mouth 2 (two) times daily as needed for muscle spasms. Marland Kitchen   escitalopram (LEXAPRO) 20 MG tablet, Take 1 tablet by mouth once daily .  gabapentin (NEURONTIN) 100 MG capsule, Take 1 capsule (100 mg total) by mouth 3 (three) times daily. Marland Kitchen  glucose blood (ONE TOUCH ULTRA TEST) test strip, Use as directed three times per day E11.9 .  Lancets MISC, Use as directed three times per day E11.9 .  LORazepam (ATIVAN) 0.5 MG tablet, TAKE 1 TABLET BY MOUTH ONCE DAILY AS NEEDED .  magnesium oxide (MAG-OX) 400 MG tablet, Take 400 mg by mouth daily. .  Multiple Vitamin (MULTIVITAMIN) tablet, Take 1 tablet by mouth daily.   .  vitamin C (  ASCORBIC ACID) 500 MG tablet, Take 500 mg by mouth daily.    Past medical history, social, surgical and family history all reviewed in electronic medical record.  Valentine pertanent information unless stated regarding to the chief complaint.   Review of Systems:  Valentine headache, visual changes, nausea, vomiting, diarrhea, constipation, dizziness, abdominal pain, skin rash, fevers, chills, night sweats, weight loss, swollen lymph nodes, body aches, joint swelling, muscle aches, chest pain, shortness of breath, mood changes.   Objective  Blood pressure 134/80, pulse 84, height '5\' 9"'  (1.753 m), SpO2 97 %.    General: Valentine apparent distress alert and oriented x3 mood and affect normal, dressed appropriately.  HEENT: Pupils equal, extraocular movements intact  Respiratory: Patient's speak in full sentences and does not appear short of breath  Cardiovascular: Valentine lower extremity edema, non tender, Valentine erythema  Skin: Warm dry intact with Valentine signs of infection or rash on extremities or on axial skeleton.  Abdomen: Soft nontender  Neuro: Cranial nerves II through XII are intact, neurovascularly intact in all extremities with 2+ DTRs and 2+ pulses.  Lymph: Valentine lymphadenopathy of posterior or anterior cervical chain or axillae bilaterally.  Gait normal with good balance and coordination.  MSK:  Non tender with full range of motion and good stability and  symmetric strength and tone of shoulders, elbows, wrist, hip, and ankles bilaterally.  Knee exam shows the patient has very minimal tenderness to palpation over the medial joint line full range of motion.  Valentine instability.  Mild positive McMurray's left knee. Contralateral knee unremarkable   Impression and Recommendations:      The above documentation has been reviewed and is accurate and complete Scott Pulley, DO       Note: This dictation was prepared with Dragon dictation along with smaller phrase technology. Any transcriptional errors that result from this process are unintentional.

## 2019-06-30 ENCOUNTER — Other Ambulatory Visit: Payer: Self-pay

## 2019-06-30 ENCOUNTER — Ambulatory Visit (INDEPENDENT_AMBULATORY_CARE_PROVIDER_SITE_OTHER): Payer: 59

## 2019-06-30 DIAGNOSIS — Z23 Encounter for immunization: Secondary | ICD-10-CM

## 2019-07-08 ENCOUNTER — Other Ambulatory Visit: Payer: Self-pay | Admitting: Internal Medicine

## 2019-07-14 ENCOUNTER — Other Ambulatory Visit: Payer: Self-pay | Admitting: Internal Medicine

## 2019-07-14 ENCOUNTER — Other Ambulatory Visit: Payer: Self-pay | Admitting: *Deleted

## 2019-07-14 MED ORDER — METOPROLOL TARTRATE 25 MG PO TABS: ORAL_TABLET | ORAL | 1 refills | Status: DC

## 2019-07-14 NOTE — Telephone Encounter (Signed)
Done erx 

## 2019-07-15 ENCOUNTER — Other Ambulatory Visit (INDEPENDENT_AMBULATORY_CARE_PROVIDER_SITE_OTHER): Payer: 59

## 2019-07-15 ENCOUNTER — Telehealth: Payer: Self-pay

## 2019-07-15 DIAGNOSIS — E119 Type 2 diabetes mellitus without complications: Secondary | ICD-10-CM

## 2019-07-15 LAB — BASIC METABOLIC PANEL
BUN: 14 mg/dL (ref 6–23)
CO2: 25 mEq/L (ref 19–32)
Calcium: 9.8 mg/dL (ref 8.4–10.5)
Chloride: 104 mEq/L (ref 96–112)
Creatinine, Ser: 1.04 mg/dL (ref 0.40–1.50)
GFR: 72.47 mL/min (ref >60.00)
Glucose, Bld: 147 mg/dL — ABNORMAL HIGH (ref 70–99)
Potassium: 4.6 mEq/L (ref 3.5–5.1)
Sodium: 138 mEq/L (ref 135–145)

## 2019-07-15 LAB — HEMOGLOBIN A1C: Hgb A1c MFr Bld: 7.5 % — ABNORMAL HIGH (ref 4.6–6.5)

## 2019-07-15 NOTE — Telephone Encounter (Signed)
DM lab work needed for upcoming follow up [place]

## 2019-07-23 ENCOUNTER — Encounter: Payer: Self-pay | Admitting: Internal Medicine

## 2019-07-23 ENCOUNTER — Ambulatory Visit (INDEPENDENT_AMBULATORY_CARE_PROVIDER_SITE_OTHER)
Admission: RE | Admit: 2019-07-23 | Discharge: 2019-07-23 | Disposition: A | Discharge status: Home / Self Care | Payer: 59 | Source: Ambulatory Visit | Attending: Internal Medicine | Admitting: Internal Medicine

## 2019-07-23 ENCOUNTER — Other Ambulatory Visit: Payer: Self-pay

## 2019-07-23 ENCOUNTER — Ambulatory Visit (INDEPENDENT_AMBULATORY_CARE_PROVIDER_SITE_OTHER): Payer: 59 | Admitting: Internal Medicine

## 2019-07-23 VITALS — BP 140/86 | HR 64 | Temp 98.6°F | Ht 69.0 in | Wt 213.0 lb

## 2019-07-23 DIAGNOSIS — R062 Wheezing: Secondary | ICD-10-CM | POA: Diagnosis not present

## 2019-07-23 DIAGNOSIS — Z0000 Encounter for general adult medical examination without abnormal findings: Secondary | ICD-10-CM

## 2019-07-23 DIAGNOSIS — F17200 Nicotine dependence, unspecified, uncomplicated: Secondary | ICD-10-CM

## 2019-07-23 DIAGNOSIS — F411 Generalized anxiety disorder: Secondary | ICD-10-CM | POA: Diagnosis not present

## 2019-07-23 DIAGNOSIS — E782 Mixed hyperlipidemia: Secondary | ICD-10-CM | POA: Diagnosis not present

## 2019-07-23 DIAGNOSIS — E119 Type 2 diabetes mellitus without complications: Secondary | ICD-10-CM | POA: Diagnosis not present

## 2019-07-23 DIAGNOSIS — I10 Essential (primary) hypertension: Secondary | ICD-10-CM | POA: Diagnosis not present

## 2019-07-23 MED ORDER — LORAZEPAM 0.5 MG PO TABS: 0.5000 mg | ORAL_TABLET | Freq: Two times a day (BID) | ORAL | 5 refills | Status: DC | PRN

## 2019-07-23 NOTE — Progress Notes (Addendum)
Subjective:    Patient ID: Scott Valentine, male    DOB: [birth year], 63 y.o.   MRN: [id number]  HPI Here to f/u; overall doing ok,  Pt denies chest pain, increasing sob or doe, wheezing, orthopnea, PND, increased LE swelling, palpitations, dizziness or syncope.  Pt denies new neurological symptoms such as new headache, or facial or extremity weakness or numbness.  Pt denies polydipsia, polyuria, or low sugar episode.  Pt states overall good compliance with meds, mostly trying to follow appropriate diet, with wt overall stable,  but little exercise however.  Wt Readings from Last 3 Encounters:  07/23/19 213 lb (96.6 kg)  05/19/19 212 lb (96.2 kg)  04/16/19 212 lb (96.2 kg)  Plans to retire in 6 mo, does not want to change DM meds now.  Denies worsening depressive symptoms, suicidal ideation, or panic, has ongoing anxiety and family stress Past Medical History:  Diagnosis Date  . Allergy   . Anxiety state, unspecified 10/08/2007  . Cervical radiculopathy   . Cough 11/08/2009  . Diabetes mellitus without complication ([place])   . Dysmetabolic syndrome X 7/82/9562  . GERD 05/23/2008  . GLUCOSE INTOLERANCE 10/19/2008  . HYPERLIPIDEMIA 10/08/2007  . HYPERPLASIA PROSTATE UNS W/O UR OBST & OTH LUTS 10/08/2007  . Impaired glucose tolerance 12/30/2010  . Other and unspecified hyperlipidemia 05/01/2007  . Rectal bleeding   . 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 TOBACCO USE, QUIT 10/08/2007  . UNS ADVRS EFF UNS RX MEDICINAL&BIOLOGICAL SBSTNC 12/17/2007  . Unspecified essential hypertension 05/01/2007  . URI 10/29/2007   Past Surgical History:  Procedure Laterality Date  . ANAL FISSURE REPAIR  04/25/11  . finger surgury' Left 05/30/2008   3rd finger  . HEMORRHOID SURGERY  04/25/11  . POSTERIOR CERVICAL LAMINECTOMY Right 10/21/2018   Procedure: Right Cervical five-six Cervical six-seven Posterior cervical foraminotomies;  Surgeon: Judith Part, MD;  Location: [facility];  Service: Neurosurgery;   Laterality: Right;    reports that he has been smoking. He has a 44.00 pack-year smoking history. He has never used smokeless tobacco. He reports that he does not drink alcohol or use drugs. family history includes Coronary artery disease (age of onset: 26) in his father; Diabetes in his mother; Heart attack in his paternal grandfather; Heart disease in his brother and father; Lung cancer in his father; Mental illness in his mother; Prostate cancer in his paternal grandfather. Allergies  Allergen Reactions  . Sulfonamide Derivatives Other (See Comments)    stevens johnson  . Atorvastatin Other (See Comments)    myalgias  . Ezetimibe Other (See Comments)    myalgias   Current Outpatient Medications on File Prior to Visit  Medication Sig Dispense Refill  . blood glucose meter kit and supplies Dispense based on patient and insurance preference. Use up to four times daily as directed. (E11.9). 1 each 0  . cyclobenzaprine (FLEXERIL) 10 MG tablet Take 0.5-1 tablets (5-10 mg total) by mouth 2 (two) times daily as needed for muscle spasms. 120 tablet 2  . empagliflozin (JARDIANCE) 25 MG TABS tablet Take 25 mg by mouth daily. 90 tablet 3  . escitalopram (LEXAPRO) 20 MG tablet Take 1 tablet by mouth once daily 30 tablet 0  . fenofibrate 160 MG tablet Take 1 tablet by mouth once daily 30 tablet 5  . fexofenadine (ALLEGRA) 180 MG tablet Take 1 tablet (180 mg total) by mouth daily as needed. (Patient taking differently: Take 180 mg by mouth daily. ) 90 tablet 3  .  folic acid (FOLVITE) 1 MG tablet Take 1 tablet (1 mg total) by mouth daily. 90 tablet 3  . gabapentin (NEURONTIN) 100 MG capsule Take 1 capsule (100 mg total) by mouth 3 (three) times daily. 90 capsule 5  . glipiZIDE (GLUCOTROL XL) 10 MG 24 hr tablet Take 1 tablet by mouth once daily with breakfast 30 tablet 5  . glucose blood (ONE TOUCH ULTRA TEST) test strip Use as directed three times per day E11.9 300 each 12  . HYDROcodone-acetaminophen  (NORCO) 7.5-325 MG tablet Take 1 tablet by mouth every 6 (six) hours as needed for moderate pain. 30 tablet 0  . Lancets MISC Use as directed three times per day E11.9 300 each 11  . losartan (COZAAR) 25 MG tablet Take 1 tablet (25 mg total) by mouth 2 (two) times daily. (Patient taking differently: Take 50 mg by mouth daily. ) 180 tablet 3  . losartan (COZAAR) 50 MG tablet Take 1 tablet by mouth once daily 30 tablet 0  . magnesium oxide (MAG-OX) 400 MG tablet Take 400 mg by mouth daily.    . metFORMIN (GLUCOPHAGE-XR) 500 MG 24 hr tablet TAKE 4 TABLETS BY MOUTH IN THE MORNING 120 tablet 0  . metoprolol tartrate (LOPRESSOR) 25 MG tablet TAKE 1/2 (ONE-HALF) TABLET BY MOUTH ONCE DAILY 55 tablet 1  . Multiple Vitamin (MULTIVITAMIN) tablet Take 1 tablet by mouth daily.      Marland Kitchen oxyCODONE (OXY IR/ROXICODONE) 5 MG immediate release tablet Take 1 tablet (5 mg total) by mouth every 4 (four) hours as needed (pain). 30 tablet 0  . rosuvastatin (CRESTOR) 40 MG tablet Take 1 tablet by mouth once daily 30 tablet 5  . vitamin C (ASCORBIC ACID) 500 MG tablet Take 500 mg by mouth daily.     No current facility-administered medications on file prior to visit.    Review of Systems  Constitutional: Negative for other unusual diaphoresis or sweats HENT: Negative for ear discharge or swelling Eyes: Negative for other worsening visual disturbances Respiratory: Negative for stridor or other swelling  Gastrointestinal: Negative for worsening distension or other blood Genitourinary: Negative for retention or other urinary change Musculoskeletal: Negative for other MSK pain or swelling Skin: Negative for color change or other new lesions Neurological: Negative for worsening tremors and other numbness  Psychiatric/Behavioral: Negative for worsening agitation or other fatigue All otherwise neg per pt     Objective:   Physical Exam BP 140/86   Pulse 64   Temp 98.6 F (37 C) (Oral)   Ht '5\' 9"'  (1.753 m)   Wt 213 lb  (96.6 kg)   SpO2 97%   BMI 31.45 kg/m  VS noted,  Constitutional: Pt appears in NAD HENT: Head: NCAT.  Right Ear: External ear normal.  Left Ear: External ear normal.  Eyes: . Pupils are equal, round, and reactive to light. Conjunctivae and EOM are normal Nose: without d/c or deformity Neck: Neck supple. Gross normal ROM Cardiovascular: Normal rate and regular rhythm.   Pulmonary/Chest: Effort normal and breath sounds without rales but with isolated wheezing to left chest noted.  Abd:  Soft, NT, ND, + BS, no organomegaly Neurological: Pt is alert. At baseline orientation, motor grossly intact Skin: Skin is warm. No rashes, other new lesions, no LE edema Psychiatric: Pt behavior is normal without agitation , 1+ nervous All otherwise neg per pt Lab Results  Component Value Date   WBC 7.4 01/15/2019   HGB 15.8 01/15/2019   HCT 45.7 01/15/2019  PLT 231.0 01/15/2019   GLUCOSE 147 (H) 07/15/2019   CHOL 153 04/09/2019   TRIG 206.0 (H) 04/09/2019   HDL 34.50 (L) 04/09/2019   LDLDIRECT 93.0 04/09/2019   LDLCALC 79 10/09/2018   ALT 13 04/09/2019   AST 17 04/09/2019   NA 138 07/15/2019   K 4.6 07/15/2019   CL 104 07/15/2019   CREATININE 1.04 07/15/2019   BUN 14 07/15/2019   CO2 25 07/15/2019   TSH 2.54 01/15/2019   PSA 1.01 01/15/2019   HGBA1C 7.5 (H) 07/15/2019   MICROALBUR <0.7 01/15/2019      Assessment & Plan:

## 2019-07-23 NOTE — Patient Instructions (Addendum)
OK to increase the lorazepam to twice per day as needed  Please continue all other medications as before, and refills have been done if requested.  Please have the pharmacy call with any other refills you may need.  Please continue your efforts at being more active, low cholesterol diet, and weight control.  You are otherwise up to date with prevention measures today.  Please keep your appointments with your specialists as you may have planned  Please go to the XRAY Department in the Basement (go straight as you get off the elevator) for the x-ray testing  You will be contacted by phone if any changes need to be made immediately.  Otherwise, you will receive a letter about your results with an explanation, but please check with MyChart first.  Please remember to sign up for MyChart if you have not done so, as this will be important to you in the future with finding out test results, communicating by private email, and scheduling acute appointments online when needed.  Please return in 6 months, or sooner if needed, with Lab testing done 3-5 days before

## 2019-07-24 ENCOUNTER — Encounter: Payer: Self-pay | Admitting: Internal Medicine

## 2019-07-24 DIAGNOSIS — R062 Wheezing: Secondary | ICD-10-CM | POA: Insufficient documentation

## 2019-07-24 DIAGNOSIS — F17200 Nicotine dependence, unspecified, uncomplicated: Secondary | ICD-10-CM | POA: Insufficient documentation

## 2019-07-24 NOTE — Assessment & Plan Note (Addendum)
Uncontrolled, declines any med change at this time, o/w stable overall by history and exam, recent data reviewed with pt, and pt to continue medical treatment as before,  to f/u any worsening symptoms or concerns  Note:  Total time for pt hx, exam, review of record with pt in the room, determination of diagnoses and plan for further eval and tx is > 40 min, with over 50% spent in coordination and counseling of patient including the differential dx, tx, further evaluation and other management of DM, HTn, HLD, wheezing, smoker counseling

## 2019-07-24 NOTE — Assessment & Plan Note (Signed)
stable overall by history and exam, recent data reviewed with pt, and pt to continue medical treatment as before,  to f/u any worsening symptoms or concerns  

## 2019-07-24 NOTE — Assessment & Plan Note (Signed)
Also for cxr,  to f/u any worsening symptoms or concerns 

## 2019-07-24 NOTE — Assessment & Plan Note (Signed)
Urged to quit 

## 2019-07-24 NOTE — Addendum Note (Signed)
Addended by: Biagio Borg on: 07/24/2019 07:26 PM   Modules accepted: Level of Service

## 2019-07-24 NOTE — Assessment & Plan Note (Signed)
Stable, for med refill 

## 2019-08-02 ENCOUNTER — Encounter: Payer: Self-pay | Admitting: Internal Medicine

## 2019-08-02 ENCOUNTER — Other Ambulatory Visit: Payer: Self-pay

## 2019-08-02 ENCOUNTER — Ambulatory Visit (INDEPENDENT_AMBULATORY_CARE_PROVIDER_SITE_OTHER): Payer: 59 | Admitting: Internal Medicine

## 2019-08-02 VITALS — BP 134/78 | HR 75 | Temp 98.1°F | Ht 69.0 in | Wt 212.0 lb

## 2019-08-02 DIAGNOSIS — L600 Ingrowing nail: Secondary | ICD-10-CM | POA: Diagnosis not present

## 2019-08-02 DIAGNOSIS — L03011 Cellulitis of right finger: Secondary | ICD-10-CM | POA: Diagnosis not present

## 2019-08-02 DIAGNOSIS — E119 Type 2 diabetes mellitus without complications: Secondary | ICD-10-CM

## 2019-08-02 DIAGNOSIS — I10 Essential (primary) hypertension: Secondary | ICD-10-CM

## 2019-08-02 MED ORDER — DOXYCYCLINE HYCLATE 100 MG PO TABS: 100.0000 mg | ORAL_TABLET | Freq: Two times a day (BID) | ORAL | 0 refills | Status: DC

## 2019-08-02 NOTE — Patient Instructions (Signed)
Please take all new medication as prescribed - the antibiotic  You will be contacted regarding the referral for: [facility]  Please continue all other medications as before, and refills have been done if requested.  Please have the pharmacy call with any other refills you may need.  Please continue your efforts at being more active, low cholesterol diet, and weight control.  Please keep your appointments with your specialists as you may have planned

## 2019-08-02 NOTE — Progress Notes (Signed)
Subjective:    Patient ID: Scott Valentine, male    DOB: 01/29/1956, 63 y.o.   MRN: [id number]  HPI  Here to f/u; overall doing ok,  Pt denies chest pain, increasing sob or doe, wheezing, orthopnea, PND, increased LE swelling, palpitations, dizziness or syncope.  Pt denies new neurological symptoms such as new headache, or facial or extremity weakness or numbness.  Pt denies polydipsia, polyuria, or low sugar episode.  Pt states overall good compliance with meds.  Also with red, tender swelling area to the right thumb nail lateral aspect, without fever or drainage x 3 days. Past Medical History:  Diagnosis Date  . Allergy   . Anxiety state, unspecified 10/08/2007  . Cervical radiculopathy   . Cough 11/08/2009  . Diabetes mellitus without complication ([place])   . Dysmetabolic syndrome X 03/03/6388  . GERD 05/23/2008  . GLUCOSE INTOLERANCE 10/19/2008  . HYPERLIPIDEMIA 10/08/2007  . HYPERPLASIA PROSTATE UNS W/O UR OBST & OTH LUTS 10/08/2007  . Impaired glucose tolerance 12/30/2010  . Other and unspecified hyperlipidemia 05/01/2007  . Rectal bleeding   . 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 TOBACCO USE, QUIT 10/08/2007  . UNS ADVRS EFF UNS RX MEDICINAL&BIOLOGICAL SBSTNC 12/17/2007  . Unspecified essential hypertension 05/01/2007  . URI 10/29/2007   Past Surgical History:  Procedure Laterality Date  . ANAL FISSURE REPAIR  04/25/11  . finger surgury' Left 05/30/2008   3rd finger  . HEMORRHOID SURGERY  04/25/11  . POSTERIOR CERVICAL LAMINECTOMY Right 10/21/2018   Procedure: Right Cervical five-six Cervical six-seven Posterior cervical foraminotomies;  Surgeon: Judith Part, MD;  Location: [facility];  Service: Neurosurgery;  Laterality: Right;    reports that he has been smoking. He has a 44.00 pack-year smoking history. He has never used smokeless tobacco. He reports that he does not drink alcohol or use drugs. family history includes Coronary artery disease (age of onset: 56) in his father;  Diabetes in his mother; Heart attack in his paternal grandfather; Heart disease in his brother and father; Lung cancer in his father; Mental illness in his mother; Prostate cancer in his paternal grandfather. Allergies  Allergen Reactions  . Sulfonamide Derivatives Other (See Comments)    stevens johnson  . Atorvastatin Other (See Comments)    myalgias  . Ezetimibe Other (See Comments)    myalgias   Current Outpatient Medications on File Prior to Visit  Medication Sig Dispense Refill  . blood glucose meter kit and supplies Dispense based on patient and insurance preference. Use up to four times daily as directed. (E11.9). 1 each 0  . cyclobenzaprine (FLEXERIL) 10 MG tablet Take 0.5-1 tablets (5-10 mg total) by mouth 2 (two) times daily as needed for muscle spasms. 120 tablet 2  . empagliflozin (JARDIANCE) 25 MG TABS tablet Take 25 mg by mouth daily. 90 tablet 3  . escitalopram (LEXAPRO) 20 MG tablet Take 1 tablet by mouth once daily 30 tablet 0  . fenofibrate 160 MG tablet Take 1 tablet by mouth once daily 30 tablet 5  . fexofenadine (ALLEGRA) 180 MG tablet Take 1 tablet (180 mg total) by mouth daily as needed. (Patient taking differently: Take 180 mg by mouth daily. ) 90 tablet 3  . folic acid (FOLVITE) 1 MG tablet Take 1 tablet (1 mg total) by mouth daily. 90 tablet 3  . gabapentin (NEURONTIN) 100 MG capsule Take 1 capsule (100 mg total) by mouth 3 (three) times daily. 90 capsule 5  . glipiZIDE (GLUCOTROL XL) 10  MG 24 hr tablet Take 1 tablet by mouth once daily with breakfast 30 tablet 5  . glucose blood (ONE TOUCH ULTRA TEST) test strip Use as directed three times per day E11.9 300 each 12  . HYDROcodone-acetaminophen (NORCO) 7.5-325 MG tablet Take 1 tablet by mouth every 6 (six) hours as needed for moderate pain. 30 tablet 0  . Lancets MISC Use as directed three times per day E11.9 300 each 11  . LORazepam (ATIVAN) 0.5 MG tablet Take 1 tablet (0.5 mg total) by mouth 2 (two) times daily as  needed for anxiety. 60 tablet 5  . losartan (COZAAR) 25 MG tablet Take 1 tablet (25 mg total) by mouth 2 (two) times daily. (Patient taking differently: Take 50 mg by mouth daily. ) 180 tablet 3  . losartan (COZAAR) 50 MG tablet Take 1 tablet by mouth once daily 30 tablet 0  . magnesium oxide (MAG-OX) 400 MG tablet Take 400 mg by mouth daily.    . metFORMIN (GLUCOPHAGE-XR) 500 MG 24 hr tablet TAKE 4 TABLETS BY MOUTH IN THE MORNING 120 tablet 0  . metoprolol tartrate (LOPRESSOR) 25 MG tablet TAKE 1/2 (ONE-HALF) TABLET BY MOUTH ONCE DAILY 55 tablet 1  . Multiple Vitamin (MULTIVITAMIN) tablet Take 1 tablet by mouth daily.      Marland Kitchen oxyCODONE (OXY IR/ROXICODONE) 5 MG immediate release tablet Take 1 tablet (5 mg total) by mouth every 4 (four) hours as needed (pain). 30 tablet 0  . rosuvastatin (CRESTOR) 40 MG tablet Take 1 tablet by mouth once daily 30 tablet 5  . vitamin C (ASCORBIC ACID) 500 MG tablet Take 500 mg by mouth daily.     No current facility-administered medications on file prior to visit.    Review of Systems  Constitutional: Negative for other unusual diaphoresis or sweats HENT: Negative for ear discharge or swelling Eyes: Negative for other worsening visual disturbances Respiratory: Negative for stridor or other swelling  Gastrointestinal: Negative for worsening distension or other blood Genitourinary: Negative for retention or other urinary change Musculoskeletal: Negative for other MSK pain or swelling Skin: Negative for color change or other new lesions Neurological: Negative for worsening tremors and other numbness  Psychiatric/Behavioral: Negative for worsening agitation or other fatigue All otherwise neg per pt     Objective:   Physical Exam  BP 134/78   Pulse 75   Temp 98.1 F (36.7 C) (Oral)   Ht _0  (1.753 m)   Wt 212 lb (96.2 kg)   SpO2 97%   BMI 31.31 kg/m  VS noted,  Constitutional: Pt appears in NAD HENT: Head: NCAT.  Right Ear: External ear normal.   Left Ear: External ear normal.  Eyes: . Pupils are equal, round, and reactive to light. Conjunctivae and EOM are normal Nose: without d/c or deformity Neck: Neck supple. Gross normal ROM Cardiovascular: Normal rate and regular rhythm.   Pulmonary/Chest: Effort normal and breath sounds without rales or wheezing.  Right thumb lateral aspect red, tender swelling with pus beneath nail, and ingrown lateral aspect Neurological: Pt is alert. At baseline orientation, motor grossly intact Skin: Skin is warm. No rashes, other new lesions, no LE edema Psychiatric: Pt behavior is normal without agitation  All otherwise neg per pt Lab Results  Component Value Date   WBC 7.4 01/15/2019   HGB 15.8 01/15/2019   HCT 45.7 01/15/2019   PLT 231.0 01/15/2019   GLUCOSE 147 (H) 07/15/2019   CHOL 153 04/09/2019   TRIG 206.0 (H) 04/09/2019  HDL 34.50 (L) 04/09/2019   LDLDIRECT 93.0 04/09/2019   LDLCALC 79 10/09/2018   ALT 13 04/09/2019   AST 17 04/09/2019   NA 138 07/15/2019   K 4.6 07/15/2019   CL 104 07/15/2019   CREATININE 1.04 07/15/2019   BUN 14 07/15/2019   CO2 25 07/15/2019   TSH 2.54 01/15/2019   PSA 1.01 01/15/2019   HGBA1C 7.5 (H) 07/15/2019   MICROALBUR <0.7 01/15/2019         Assessment & Plan:

## 2019-08-07 ENCOUNTER — Encounter: Payer: Self-pay | Admitting: Internal Medicine

## 2019-08-07 NOTE — Assessment & Plan Note (Signed)
For hand surgury referral

## 2019-08-07 NOTE — Assessment & Plan Note (Signed)
stable overall by history and exam, recent data reviewed with pt, and pt to continue medical treatment as before,  to f/u any worsening symptoms or concerns  

## 2019-08-07 NOTE — Assessment & Plan Note (Signed)
Mild to mod, for antibx course,  to f/u any worsening symptoms or concerns 

## 2019-08-11 ENCOUNTER — Other Ambulatory Visit: Payer: Self-pay | Admitting: Internal Medicine

## 2019-08-13 ENCOUNTER — Encounter (HOSPITAL_BASED_OUTPATIENT_CLINIC_OR_DEPARTMENT_OTHER)
Admission: RE | Admit: 2019-08-13 | Discharge: 2019-08-13 | Disposition: A | Discharge status: Home / Self Care | Payer: 59 | Source: Ambulatory Visit | Attending: Orthopaedic Surgery | Admitting: Orthopaedic Surgery

## 2019-08-13 ENCOUNTER — Ambulatory Visit (INDEPENDENT_AMBULATORY_CARE_PROVIDER_SITE_OTHER): Payer: 59 | Admitting: Orthopaedic Surgery

## 2019-08-13 ENCOUNTER — Encounter: Payer: Self-pay | Admitting: Orthopaedic Surgery

## 2019-08-13 ENCOUNTER — Other Ambulatory Visit: Payer: Self-pay

## 2019-08-13 ENCOUNTER — Encounter (HOSPITAL_BASED_OUTPATIENT_CLINIC_OR_DEPARTMENT_OTHER): Payer: Self-pay

## 2019-08-13 VITALS — Ht 69.0 in | Wt 212.0 lb

## 2019-08-13 DIAGNOSIS — F419 Anxiety disorder, unspecified: Secondary | ICD-10-CM | POA: Diagnosis not present

## 2019-08-13 DIAGNOSIS — L03011 Cellulitis of right finger: Secondary | ICD-10-CM | POA: Diagnosis not present

## 2019-08-13 DIAGNOSIS — L600 Ingrowing nail: Secondary | ICD-10-CM | POA: Diagnosis present

## 2019-08-13 DIAGNOSIS — F17200 Nicotine dependence, unspecified, uncomplicated: Secondary | ICD-10-CM | POA: Diagnosis not present

## 2019-08-13 DIAGNOSIS — Z7984 Long term (current) use of oral hypoglycemic drugs: Secondary | ICD-10-CM | POA: Diagnosis not present

## 2019-08-13 DIAGNOSIS — E785 Hyperlipidemia, unspecified: Secondary | ICD-10-CM | POA: Diagnosis not present

## 2019-08-13 DIAGNOSIS — Z801 Family history of malignant neoplasm of trachea, bronchus and lung: Secondary | ICD-10-CM | POA: Diagnosis not present

## 2019-08-13 DIAGNOSIS — Z818 Family history of other mental and behavioral disorders: Secondary | ICD-10-CM | POA: Diagnosis not present

## 2019-08-13 DIAGNOSIS — Z833 Family history of diabetes mellitus: Secondary | ICD-10-CM | POA: Diagnosis not present

## 2019-08-13 DIAGNOSIS — I10 Essential (primary) hypertension: Secondary | ICD-10-CM | POA: Diagnosis not present

## 2019-08-13 DIAGNOSIS — Z8249 Family history of ischemic heart disease and other diseases of the circulatory system: Secondary | ICD-10-CM | POA: Diagnosis not present

## 2019-08-13 DIAGNOSIS — K219 Gastro-esophageal reflux disease without esophagitis: Secondary | ICD-10-CM | POA: Diagnosis not present

## 2019-08-13 DIAGNOSIS — L511 Stevens-Johnson syndrome: Secondary | ICD-10-CM | POA: Diagnosis not present

## 2019-08-13 DIAGNOSIS — E119 Type 2 diabetes mellitus without complications: Secondary | ICD-10-CM | POA: Diagnosis not present

## 2019-08-13 DIAGNOSIS — Z8042 Family history of malignant neoplasm of prostate: Secondary | ICD-10-CM | POA: Diagnosis not present

## 2019-08-13 LAB — BASIC METABOLIC PANEL
Anion gap: 8 (ref 5–15)
BUN: 12 mg/dL (ref 8–23)
CO2: 26 mmol/L (ref 22–32)
Calcium: 9.5 mg/dL (ref 8.9–10.3)
Chloride: 105 mmol/L (ref 98–111)
Creatinine, Ser: 0.99 mg/dL (ref 0.61–1.24)
GFR calc Af Amer: >60 mL/min (ref >60)
GFR calc non Af Amer: >60 mL/min (ref >60)
Glucose, Bld: 165 mg/dL — ABNORMAL HIGH (ref 70–99)
Potassium: 4.5 mmol/L (ref 3.5–5.1)
Sodium: 139 mmol/L (ref 135–145)

## 2019-08-13 NOTE — Progress Notes (Signed)
Office Visit Note   Patient: Scott Valentine           Date of Birth: 01/31/1956           MRN: [id number] Visit Date: 08/13/2019              Requested by: Biagio Borg, MD [address] [place],  [place] [id number] PCP: Biagio Borg, MD   Assessment & Plan: Visit Diagnoses:  1. Ingrown thumb nail, right     Plan: Impression is right ingrown thumbnail.  We have discussed surgical intervention as definitive treatment.  The patient has elected to proceed.  Risks, benefits possible occasions reviewed in detail.  Rehab recovery time discussed.  His last hemoglobin A1c was 7.5 which was less than 3 months ago.  He will follow-up with [place] after surgical intervention.  Follow-Up Instructions: Return for post-op.   Orders:  No orders of the defined types were placed in this encounter.  No orders of the defined types were placed in this encounter.     Procedures: No procedures performed   Clinical Data: No additional findings.   Subjective: Chief Complaint  Patient presents with  . Right Hand - Nail Problem    HPI patient is a pleasant 63 year old diabetic gentleman who presents to our clinic today with pain to his right thumb.  This began approximately 1 month ago without a specific injury.  The pain is primarily to the radial side of his thumb nail.  He noticed an increased thickening of the nail with pain to the surrounding soft tissue.  Number any drainage or significant erythema.  He was started on what sounds like doxycycline by his PCP which has not made a difference in appearance or pain.  No fevers or chills.  Review of Systems as detailed in HPI.  All others reviewed and are negative.   Objective: Vital Signs: Ht 5\' 9"  (1.753 m)   Wt 212 lb (96.2 kg)   BMI 31.31 kg/m   Physical Exam well-developed well-nourished gentleman in no acute distress.  Alert and oriented x3.  Ortho Exam examination of the right thumb reveals a thickened nail to the radial side.   He has tenderness to the surrounding soft tissue.  No erythema.  No drainage.  No evidence of abscess.  Full range of motion.  He is neurovascular intact distally.  Specialty Comments:  No specialty comments available.  Imaging: No new imaging   PMFS History: Patient Active Problem List   Diagnosis Date Noted  . Acute paronychia of right thumb 08/02/2019  . Ingrown thumb nail, right 08/02/2019  . Wheeze 07/24/2019  . Smoker 07/24/2019  . Pain and swelling of left lower leg 04/16/2019  . Left knee pain 04/09/2019  . Cervical radiculopathy 10/21/2018  . Cervical radiculitis 09/25/2018  . Cervical disc herniation 08/31/2018  . Cervical stenosis of spine 08/31/2018  . Increased prostate specific antigen (PSA) velocity 03/18/2014  . Plantar fasciitis 11/11/2012  . Decreased libido 02/07/2012  . Anal fissure 06/11/2011  . Pruritus ani 06/11/2011  . Preventative health care 01/04/2011  . Anal or rectal pain 01/04/2011  . Diabetes ([place]) 12/30/2010  . GERD 05/23/2008  . UNS ADVRS EFF UNS RX MEDICINAL&BIOLOGICAL SBSTNC 12/17/2007  . HYPERLIPIDEMIA 10/08/2007  . Dysmetabolic syndrome X [id number]  . Anxiety state 10/08/2007  . HYPERPLASIA PROSTATE UNS W/O UR OBST & OTH LUTS 10/08/2007  . 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 TOBACCO USE, QUIT 10/08/2007  .  Essential hypertension 05/01/2007   Past Medical History:  Diagnosis Date  . Allergy   . Anxiety state, unspecified 10/08/2007  . Cervical radiculopathy   . Cough 11/08/2009  . Diabetes mellitus without complication ([place])   . DVT (deep venous thrombosis) ([place]) 2020   right leg, dissolved itself  . Dysmetabolic syndrome X [id number]  . GERD 05/23/2008  . GLUCOSE INTOLERANCE 10/19/2008  . HYPERLIPIDEMIA 10/08/2007  . HYPERPLASIA PROSTATE UNS W/O UR OBST & OTH LUTS 10/08/2007  . Impaired glucose tolerance 12/30/2010  . Other and unspecified hyperlipidemia 05/01/2007  . Rectal bleeding   . STEVENS-JOHNSON SYNDROME, HX OF  10/08/2007  . TOBACCO USE, QUIT 10/08/2007  . UNS ADVRS EFF UNS RX MEDICINAL&BIOLOGICAL SBSTNC 12/17/2007  . Unspecified essential hypertension 05/01/2007  . URI 10/29/2007    Family History  Problem Relation Age of Onset  . Diabetes Mother   . Mental illness Mother   . Heart disease Father        MI x 2  . Coronary artery disease Father 18  . Lung cancer Father        pneumonectomy  . Heart disease Brother        heart mumur  . Heart attack Paternal Grandfather   . Prostate cancer Paternal Grandfather   . Colon cancer Neg Hx     Past Surgical History:  Procedure Laterality Date  . ANAL FISSURE REPAIR  04/25/11  . finger surgury' Left 05/30/2008   3rd finger  . HEMORRHOID SURGERY  04/25/11  . POSTERIOR CERVICAL LAMINECTOMY Right 10/21/2018   Procedure: Right Cervical five-six Cervical six-seven Posterior cervical foraminotomies;  Surgeon: Judith Part, MD;  Location: [facility];  Service: Neurosurgery;  Laterality: Right;   Social History   Occupational History  . Occupation: [employer] works  Tobacco Use  . Smoking status: Current Every Day Smoker    Packs/day: 1.00    Years: 44.00    Pack years: 44.00  . Smokeless tobacco: Never Used  . Tobacco comment: Chantix  Substance and Sexual Activity  . Alcohol use: No    Alcohol/week: 0.0 standard drinks  . Drug use: No  . Sexual activity: Not on file

## 2019-08-13 NOTE — Progress Notes (Signed)

## 2019-08-14 ENCOUNTER — Other Ambulatory Visit (HOSPITAL_COMMUNITY)
Admission: RE | Admit: 2019-08-14 | Discharge: 2019-08-14 | Disposition: A | Discharge status: Home / Self Care | Payer: 59 | Source: Ambulatory Visit | Attending: Orthopaedic Surgery | Admitting: Orthopaedic Surgery

## 2019-08-14 DIAGNOSIS — Z01812 Encounter for preprocedural laboratory examination: Secondary | ICD-10-CM | POA: Insufficient documentation

## 2019-08-14 DIAGNOSIS — Z20828 Contact with and (suspected) exposure to other viral communicable diseases: Secondary | ICD-10-CM | POA: Diagnosis not present

## 2019-08-16 ENCOUNTER — Other Ambulatory Visit: Payer: Self-pay

## 2019-08-16 LAB — NOVEL CORONAVIRUS, NAA (HOSP ORDER, SEND-OUT TO REF LAB; TAT 18-24 HRS): SARS-CoV-2, NAA: NOT DETECTED

## 2019-08-18 ENCOUNTER — Ambulatory Visit (HOSPITAL_BASED_OUTPATIENT_CLINIC_OR_DEPARTMENT_OTHER): Payer: 59 | Admitting: Anesthesiology

## 2019-08-18 ENCOUNTER — Ambulatory Visit (HOSPITAL_BASED_OUTPATIENT_CLINIC_OR_DEPARTMENT_OTHER)
Admission: RE | Admit: 2019-08-18 | Discharge: 2019-08-18 | Disposition: A | Discharge status: Home / Self Care | Payer: 59 | Attending: Orthopaedic Surgery | Admitting: Orthopaedic Surgery

## 2019-08-18 ENCOUNTER — Encounter (HOSPITAL_BASED_OUTPATIENT_CLINIC_OR_DEPARTMENT_OTHER): Payer: Self-pay

## 2019-08-18 ENCOUNTER — Other Ambulatory Visit: Payer: Self-pay

## 2019-08-18 ENCOUNTER — Encounter (HOSPITAL_BASED_OUTPATIENT_CLINIC_OR_DEPARTMENT_OTHER)
Admission: RE | Disposition: A | Discharge status: Home / Self Care | Payer: Self-pay | Source: Home / Self Care | Attending: Orthopaedic Surgery

## 2019-08-18 DIAGNOSIS — Z833 Family history of diabetes mellitus: Secondary | ICD-10-CM | POA: Insufficient documentation

## 2019-08-18 DIAGNOSIS — L03011 Cellulitis of right finger: Secondary | ICD-10-CM | POA: Insufficient documentation

## 2019-08-18 DIAGNOSIS — Z801 Family history of malignant neoplasm of trachea, bronchus and lung: Secondary | ICD-10-CM | POA: Insufficient documentation

## 2019-08-18 DIAGNOSIS — F419 Anxiety disorder, unspecified: Secondary | ICD-10-CM | POA: Insufficient documentation

## 2019-08-18 DIAGNOSIS — L600 Ingrowing nail: Secondary | ICD-10-CM | POA: Insufficient documentation

## 2019-08-18 DIAGNOSIS — I10 Essential (primary) hypertension: Secondary | ICD-10-CM | POA: Insufficient documentation

## 2019-08-18 DIAGNOSIS — E785 Hyperlipidemia, unspecified: Secondary | ICD-10-CM | POA: Insufficient documentation

## 2019-08-18 DIAGNOSIS — Z7984 Long term (current) use of oral hypoglycemic drugs: Secondary | ICD-10-CM | POA: Insufficient documentation

## 2019-08-18 DIAGNOSIS — Z8042 Family history of malignant neoplasm of prostate: Secondary | ICD-10-CM | POA: Insufficient documentation

## 2019-08-18 DIAGNOSIS — F17200 Nicotine dependence, unspecified, uncomplicated: Secondary | ICD-10-CM | POA: Insufficient documentation

## 2019-08-18 DIAGNOSIS — Z818 Family history of other mental and behavioral disorders: Secondary | ICD-10-CM | POA: Insufficient documentation

## 2019-08-18 DIAGNOSIS — Z8249 Family history of ischemic heart disease and other diseases of the circulatory system: Secondary | ICD-10-CM | POA: Insufficient documentation

## 2019-08-18 DIAGNOSIS — E119 Type 2 diabetes mellitus without complications: Secondary | ICD-10-CM | POA: Insufficient documentation

## 2019-08-18 DIAGNOSIS — K219 Gastro-esophageal reflux disease without esophagitis: Secondary | ICD-10-CM | POA: Insufficient documentation

## 2019-08-18 DIAGNOSIS — L511 Stevens-Johnson syndrome: Secondary | ICD-10-CM | POA: Insufficient documentation

## 2019-08-18 HISTORY — PX: DIGIT NAIL REMOVAL: SHX5052

## 2019-08-18 LAB — GLUCOSE, CAPILLARY
Glucose-Capillary: 149 mg/dL — ABNORMAL HIGH (ref 70–99)
Glucose-Capillary: 130 mg/dL — ABNORMAL HIGH (ref 70–99)

## 2019-08-18 SURGERY — REMOVAL, DIGIT NAIL
Anesthesia: Monitor Anesthesia Care | Site: Thumb | Laterality: Right

## 2019-08-18 MED ORDER — ACETAMINOPHEN 500 MG PO TABS: 1000.0000 mg | ORAL_TABLET | Freq: Once | ORAL | Status: DC

## 2019-08-18 MED ORDER — LACTATED RINGERS IV SOLN
INTRAVENOUS | Status: DC
  Administered 2019-08-18: 10:00:00 via INTRAVENOUS

## 2019-08-18 MED ORDER — PROPOFOL 10 MG/ML IV BOLUS
INTRAVENOUS | Status: DC | PRN
  Administered 2019-08-18: 20 mg via INTRAVENOUS
  Administered 2019-08-18: 40 mg via INTRAVENOUS
  Administered 2019-08-18 (×2): 20 mg via INTRAVENOUS

## 2019-08-18 MED ORDER — PROPOFOL 500 MG/50ML IV EMUL
INTRAVENOUS | Status: DC | PRN
  Administered 2019-08-18: 50 ug/kg/min via INTRAVENOUS

## 2019-08-18 MED ORDER — BUPIVACAINE HCL (PF) 0.25 % IJ SOLN
INTRAMUSCULAR | Status: DC | PRN
  Administered 2019-08-18: 14 mL

## 2019-08-18 MED ORDER — HYDROCODONE-ACETAMINOPHEN 5-325 MG PO TABS: 1.0000 | ORAL_TABLET | Freq: Three times a day (TID) | ORAL | 0 refills | Status: DC | PRN

## 2019-08-18 MED ORDER — LIDOCAINE HCL (CARDIAC) PF 100 MG/5ML IV SOSY
PREFILLED_SYRINGE | INTRAVENOUS | Status: DC | PRN
  Administered 2019-08-18: 100 mg via INTRAVENOUS

## 2019-08-18 MED ORDER — 0.9 % SODIUM CHLORIDE (POUR BTL) OPTIME
TOPICAL | Status: DC | PRN
  Administered 2019-08-18: 1000 mL

## 2019-08-18 MED ORDER — ONDANSETRON HCL 4 MG PO TABS: 4.0000 mg | ORAL_TABLET | Freq: Three times a day (TID) | ORAL | 0 refills | Status: DC | PRN

## 2019-08-18 MED ORDER — CHLORHEXIDINE GLUCONATE 4 % EX LIQD: 60.0000 mL | Freq: Once | CUTANEOUS | Status: DC

## 2019-08-18 MED ORDER — LACTATED RINGERS IV SOLN: INTRAVENOUS | Status: DC

## 2019-08-18 MED ORDER — CEFAZOLIN SODIUM-DEXTROSE 2-4 GM/100ML-% IV SOLN
2.0000 g | INTRAVENOUS | Status: AC
  Administered 2019-08-18: 2 g via INTRAVENOUS

## 2019-08-18 MED ORDER — OXYCODONE HCL 5 MG PO TABS: 5.0000 mg | ORAL_TABLET | Freq: Once | ORAL | Status: DC | PRN

## 2019-08-18 MED ORDER — CEFAZOLIN SODIUM-DEXTROSE 2-4 GM/100ML-% IV SOLN
INTRAVENOUS | Status: AC
  Filled 2019-08-18: qty 100

## 2019-08-18 MED ORDER — OXYCODONE HCL 5 MG/5ML PO SOLN: 5.0000 mg | Freq: Once | ORAL | Status: DC | PRN

## 2019-08-18 MED ORDER — ONDANSETRON HCL 4 MG/2ML IJ SOLN
INTRAMUSCULAR | Status: DC | PRN
  Administered 2019-08-18: 4 mg via INTRAVENOUS

## 2019-08-18 MED ORDER — PROMETHAZINE HCL 25 MG/ML IJ SOLN: 6.2500 mg | INTRAMUSCULAR | Status: DC | PRN

## 2019-08-18 MED ORDER — FENTANYL CITRATE (PF) 100 MCG/2ML IJ SOLN: 25.0000 ug | INTRAMUSCULAR | Status: DC | PRN

## 2019-08-18 SURGICAL SUPPLY — 47 items
BAND INSRT 18 STRL LF DISP RB (MISCELLANEOUS) ×2
BAND RUBBER #18 3X1/16 STRL (MISCELLANEOUS) ×4 IMPLANT
BLADE SURG 15 STRL LF DISP TIS (BLADE) ×1 IMPLANT
BLADE SURG 15 STRL SS (BLADE) ×2
BNDG CMPR 9X4 STRL LF SNTH (GAUZE/BANDAGES/DRESSINGS)
BNDG COHESIVE 1X5 TAN STRL LF (GAUZE/BANDAGES/DRESSINGS) ×1 IMPLANT
BNDG CONFORM 2 STRL LF (GAUZE/BANDAGES/DRESSINGS) ×1 IMPLANT
BNDG ELASTIC 3X5.8 VLCR STR LF (GAUZE/BANDAGES/DRESSINGS) ×2 IMPLANT
BNDG ESMARK 4X9 LF (GAUZE/BANDAGES/DRESSINGS) IMPLANT
BRUSH SCRUB EZ PLAIN DRY (MISCELLANEOUS) ×2 IMPLANT
CANISTER SUCT 1200ML W/VALVE (MISCELLANEOUS) ×2 IMPLANT
CORD BIPOLAR FORCEPS 12FT (ELECTRODE) ×2 IMPLANT
COVER BACK TABLE REUSABLE LG (DRAPES) ×2 IMPLANT
COVER MAYO STAND REUSABLE (DRAPES) ×2 IMPLANT
COVER WAND RF STERILE (DRAPES) IMPLANT
CUFF TOURN SGL QUICK 18X4 (TOURNIQUET CUFF) ×2 IMPLANT
DECANTER SPIKE VIAL GLASS SM (MISCELLANEOUS) IMPLANT
DRAPE EXTREMITY T 121X128X90 (DISPOSABLE) ×2 IMPLANT
DRAPE SURG 17X23 STRL (DRAPES) ×2 IMPLANT
DRSG EMULSION OIL 3X3 NADH (GAUZE/BANDAGES/DRESSINGS) ×1 IMPLANT
GAUZE SPONGE 4X4 12PLY STRL (GAUZE/BANDAGES/DRESSINGS) ×2 IMPLANT
GAUZE XEROFORM 1X8 LF (GAUZE/BANDAGES/DRESSINGS) ×2 IMPLANT
GLOVE BIOGEL PI IND STRL 7.0 (GLOVE) ×1 IMPLANT
GLOVE BIOGEL PI INDICATOR 7.0 (GLOVE) ×1
GLOVE ECLIPSE 7.0 STRL STRAW (GLOVE) ×2 IMPLANT
GLOVE SKINSENSE NS SZ7.5 (GLOVE) ×1
GLOVE SKINSENSE STRL SZ7.5 (GLOVE) ×1 IMPLANT
GLOVE SURG SYN 7.5  E (GLOVE) ×1
GLOVE SURG SYN 7.5 E (GLOVE) ×1 IMPLANT
GLOVE SURG SYN 7.5 PF PI (GLOVE) ×1 IMPLANT
GOWN STRL REIN XL XLG (GOWN DISPOSABLE) ×2 IMPLANT
GOWN STRL REUS W/ TWL XL LVL3 (GOWN DISPOSABLE) ×1 IMPLANT
GOWN STRL REUS W/TWL XL LVL3 (GOWN DISPOSABLE) ×2
NDL HYPO 25X1 1.5 SAFETY (NEEDLE) ×1 IMPLANT
NEEDLE HYPO 25X1 1.5 SAFETY (NEEDLE) ×2 IMPLANT
NS IRRIG 1000ML POUR BTL (IV SOLUTION) ×2 IMPLANT
PACK BASIN DAY SURGERY FS (CUSTOM PROCEDURE TRAY) ×2 IMPLANT
PAD CAST 3X4 CTTN HI CHSV (CAST SUPPLIES) ×1 IMPLANT
PADDING CAST COTTON 3X4 STRL (CAST SUPPLIES) ×2
STOCKINETTE 4X48 STRL (DRAPES) ×2 IMPLANT
SUT ETHILON 4 0 PS 2 18 (SUTURE) ×2 IMPLANT
SYR BULB 3OZ (MISCELLANEOUS) ×2 IMPLANT
SYR CONTROL 10ML LL (SYRINGE) ×2 IMPLANT
TOWEL GREEN STERILE FF (TOWEL DISPOSABLE) ×2 IMPLANT
TRAY DSU PREP LF (CUSTOM PROCEDURE TRAY) ×2 IMPLANT
TUBE CONNECTING 20X1/4 (TUBING) ×2 IMPLANT
UNDERPAD 30X36 HEAVY ABSORB (UNDERPADS AND DIAPERS) ×2 IMPLANT

## 2019-08-18 NOTE — Anesthesia Preprocedure Evaluation (Addendum)
Anesthesia Evaluation  Patient identified by MRN, date of birth, ID band Patient awake    Reviewed: Allergy & Precautions, NPO status , Patient's Chart, lab work & pertinent test results, reviewed documented beta blocker date and time   History of Anesthesia Complications Negative for: history of anesthetic complications  Airway Mallampati: II  TM Distance: >3 FB Neck ROM: Full    Dental  (+) Poor Dentition, Missing,    Pulmonary Current Smoker and Patient abstained from smoking.,    Pulmonary exam normal        Cardiovascular hypertension, Pt. on medications and Pt. on home beta blockers Normal cardiovascular exam     Neuro/Psych Anxiety negative neurological ROS     GI/Hepatic Neg liver ROS, GERD  ,  Endo/Other  diabetes, Type 2, Oral Hypoglycemic Agents  Renal/GU negative Renal ROS  negative genitourinary   Musculoskeletal negative musculoskeletal ROS (+)   Abdominal   Peds  Hematology negative hematology ROS (+)   Anesthesia Other Findings Right thumb ingrown nail  Reproductive/Obstetrics negative OB ROS                            Anesthesia Physical Anesthesia Plan  ASA: II  Anesthesia Plan: MAC   Post-op Pain Management:    Induction:   PONV Risk Score and Plan: 0 and Treatment may vary due to age or medical condition, Ondansetron and Propofol infusion  Airway Management Planned: Simple Face Mask and Natural Airway  Additional Equipment: None  Intra-op Plan:   Post-operative Plan:   Informed Consent: I have reviewed the patients History and Physical, chart, labs and discussed the procedure including the risks, benefits and alternatives for the proposed anesthesia with the patient or authorized representative who has indicated his/her understanding and acceptance.       Plan Discussed with: CRNA  Anesthesia Plan Comments:        Anesthesia Quick  Evaluation

## 2019-08-18 NOTE — Transfer of Care (Signed)
Immediate Anesthesia Transfer of Care Note  Patient: Scott Valentine  Procedure(s) Performed: REMOVAL OF RIGHT THUMB INGROWN NAIL (Right Thumb)  Patient Location: PACU  Anesthesia Type:MAC  Level of Consciousness: awake, alert , oriented and patient cooperative  Airway & Oxygen Therapy: Patient Spontanous Breathing and Patient connected to face mask oxygen  Post-op Assessment: Report given to RN and Post -op Vital signs reviewed and stable  Post vital signs: Reviewed and stable  Last Vitals:  Vitals Value Taken Time  BP 115/68 08/18/19 1134  Temp    Pulse 52 08/18/19 1136  Resp 16 08/18/19 1136  SpO2 100 % 08/18/19 1136  Vitals shown include unvalidated device data.  Last Pain:  Vitals:   08/18/19 0923  TempSrc: Tympanic  PainSc: 0-No pain      Patients Stated Pain Goal: 2 (50/53/97 6734)  Complications: No apparent anesthesia complications

## 2019-08-18 NOTE — Discharge Instructions (Signed)
 Postoperative instructions:  Weightbearing instructions: as tolerated  Dressing instructions: Keep your dressing and/or splint clean and dry at all times.  It will be removed at your first post-operative appointment.  Your stitches and/or staples will be removed at this visit.  Incision instructions:  Do not soak your incision for 3 weeks after surgery.  If the incision gets wet, pat dry and do not scrub the incision.  Pain control:  You have been given a prescription to be taken as directed for post-operative pain control.  In addition, elevate the operative extremity above the heart at all times to prevent swelling and throbbing pain.  Take over-the-counter Colace, 100mg by mouth twice a day while taking narcotic pain medications to help prevent constipation.  Follow up appointments: 1) 7 days for wound check. 2) Scott Valentine as scheduled.   -------------------------------------------------------------------------------------------------------------  After Surgery Pain Control:  After your surgery, post-surgical discomfort or pain is likely. This discomfort can last several days to a few weeks. At certain times of the day your discomfort may be more intense.  Did you receive a nerve block?  A nerve block can provide pain relief for one hour to two days after your surgery. As long as the nerve block is working, you will experience little or no sensation in the area the surgeon operated on.  As the nerve block wears off, you will begin to experience pain or discomfort. It is very important that you begin taking your prescribed pain medication before the nerve block fully wears off. Treating your pain at the first sign of the block wearing off will ensure your pain is better controlled and more tolerable when full-sensation returns. Do not wait until the pain is intolerable, as the medicine will be less effective. It is better to treat pain in advance than to try and catch up.  General  Anesthesia:  If you did not receive a nerve block during your surgery, you will need to start taking your pain medication shortly after your surgery and should continue to do so as prescribed by your surgeon.  Pain Medication:  Most commonly we prescribe Vicodin and Percocet for post-operative pain. Both of these medications contain a combination of acetaminophen (Tylenol) and a narcotic to help control pain.   It takes between 30 and 45 minutes before pain medication starts to work. It is important to take your medication before your pain level gets too intense.   Nausea is a common side effect of many pain medications. You will want to eat something before taking your pain medicine to help prevent nausea.   If you are taking a prescription pain medication that contains acetaminophen, we recommend that you do not take additional over the counter acetaminophen (Tylenol).  Other pain relieving options:   Using a cold pack to ice the affected area a few times a day (15 to 20 minutes at a time) can help to relieve pain, reduce swelling and bruising.   Elevation of the affected area can also help to reduce pain and swelling.   Post Anesthesia Home Care Instructions  Activity: Get plenty of rest for the remainder of the day. A responsible individual must stay with you for 24 hours following the procedure.  For the next 24 hours, DO NOT: -Drive a car -Operate machinery -Drink alcoholic beverages -Take any medication unless instructed by your physician -Make any legal decisions or sign important papers.  Meals: Start with liquid foods such as gelatin or soup. Progress to regular   foods as tolerated. Avoid greasy, spicy, heavy foods. If nausea and/or vomiting occur, drink only clear liquids until the nausea and/or vomiting subsides. Call your physician if vomiting continues.  Special Instructions/Symptoms: Your throat may feel dry or sore from the anesthesia or the breathing tube placed in  your throat during surgery. If this causes discomfort, gargle with warm salt water. The discomfort should disappear within 24 hours.  If you had a scopolamine patch placed behind your ear for the management of post- operative nausea and/or vomiting:  1. The medication in the patch is effective for 72 hours, after which it should be removed.  Wrap patch in a tissue and discard in the trash. Wash hands thoroughly with soap and water. 2. You may remove the patch earlier than 72 hours if you experience unpleasant side effects which may include dry mouth, dizziness or visual disturbances. 3. Avoid touching the patch. Wash your hands with soap and water after contact with the patch.      

## 2019-08-18 NOTE — H&P (Signed)
PREOPERATIVE H&P  Chief Complaint: right thumb ingrown nail  HPI: Scott Valentine is a 63 y.o. male who presents for surgical treatment of right thumb ingrown nail.  He denies any changes in medical history.  Past Medical History:  Diagnosis Date  . Allergy   . Anxiety state, unspecified 10/08/2007  . Cervical radiculopathy   . Cough 11/08/2009  . Diabetes mellitus without complication ([place])   . DVT (deep venous thrombosis) ([place]) 2020   right leg, dissolved itself  . Dysmetabolic syndrome X 6/70/1410  . GERD 05/23/2008  . GLUCOSE INTOLERANCE 10/19/2008  . HYPERLIPIDEMIA 10/08/2007  . HYPERPLASIA PROSTATE UNS W/O UR OBST & OTH LUTS 10/08/2007  . Impaired glucose tolerance 12/30/2010  . Other and unspecified hyperlipidemia 05/01/2007  . Rectal bleeding   . 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 TOBACCO USE, QUIT 10/08/2007  . UNS ADVRS EFF UNS RX MEDICINAL&BIOLOGICAL SBSTNC 12/17/2007  . Unspecified essential hypertension 05/01/2007  . URI 10/29/2007   Past Surgical History:  Procedure Laterality Date  . ANAL FISSURE REPAIR  04/25/11  . finger surgury' Left 05/30/2008   3rd finger  . HEMORRHOID SURGERY  04/25/11  . POSTERIOR CERVICAL LAMINECTOMY Right 10/21/2018   Procedure: Right Cervical five-six Cervical six-seven Posterior cervical foraminotomies;  Surgeon: Judith Part, MD;  Location: [facility];  Service: Neurosurgery;  Laterality: Right;   Social History   Socioeconomic History  . Marital status: Married    Spouse name: Not on file  . Number of children: 3  . Years of education: Not on file  . Highest education level: Not on file  Occupational History  . 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 Financial resource strain: Not on file  . Food insecurity    Worry: Not on file    Inability: Not on file  . Transportation needs    Medical: Not on file    Non-medical: Not on file  Tobacco Use  . Smoking status: Current Every Day Smoker    Packs/day: 1.00     Years: 44.00    Pack years: 44.00  . Smokeless tobacco: Never Used  . Tobacco comment: Chantix  Substance and Sexual Activity  . Alcohol use: No    Alcohol/week: 0.0 standard drinks  . Drug use: No  . Sexual activity: Not on file  Lifestyle  . Physical activity    Days per week: Not on file    Minutes per session: Not on file  . Stress: Not on file  Relationships  . Social Herbalist on phone: Not on file    Gets together: Not on file    Attends religious service: Not on file    Active member of club or organization: Not on file    Attends meetings of clubs or organizations: Not on file    Relationship status: Not on file  Other Topics Concern  . Not on file  Social History Narrative  . Not on file   Family History  Problem Relation Age of Onset  . Diabetes Mother   . Mental illness Mother   . Heart disease Father        MI x 2  . Coronary artery disease Father 2  . Lung cancer Father        pneumonectomy  . Heart disease Brother        heart mumur  . Heart attack Paternal Grandfather   . Prostate cancer Paternal Grandfather   .  Colon cancer Neg Hx    Allergies  Allergen Reactions  . Sulfonamide Derivatives Other (See Comments)    stevens johnson  . Atorvastatin Other (See Comments)    myalgias  . Ezetimibe Other (See Comments)    myalgias   Prior to Admission medications   Medication Sig Start Date End Date Taking? Authorizing Provider  aspirin EC 81 MG tablet Take 81 mg by mouth daily.   Yes [provider]  empagliflozin (JARDIANCE) 25 MG TABS tablet Take 25 mg by mouth daily. 04/09/19  Yes Biagio Borg, MD  escitalopram (LEXAPRO) 20 MG tablet Take 1 tablet by mouth once daily 08/11/19  Yes Biagio Borg, MD  fenofibrate 160 MG tablet Take 1 tablet by mouth once daily 05/24/19  Yes Biagio Borg, MD  fexofenadine (ALLEGRA) 180 MG tablet Take 1 tablet (180 mg total) by mouth daily as needed. Patient taking differently: Take 180 mg by  mouth daily.  03/18/14  Yes Biagio Borg, MD  folic acid (FOLVITE) 1 MG tablet Take 1 tablet (1 mg total) by mouth daily. 03/18/14  Yes Biagio Borg, MD  glipiZIDE (GLUCOTROL XL) 10 MG 24 hr tablet Take 1 tablet by mouth once daily with breakfast 05/24/19  Yes Biagio Borg, MD  LORazepam (ATIVAN) 0.5 MG tablet Take 1 tablet (0.5 mg total) by mouth 2 (two) times daily as needed for anxiety. 07/23/19  Yes Biagio Borg, MD  losartan (COZAAR) 25 MG tablet Take 1 tablet (25 mg total) by mouth 2 (two) times daily. Patient taking differently: Take 50 mg by mouth daily.  05/19/18  Yes Biagio Borg, MD  losartan (COZAAR) 50 MG tablet Take 1 tablet by mouth once daily 08/11/19  Yes Biagio Borg, MD  magnesium oxide (MAG-OX) 400 MG tablet Take 400 mg by mouth daily.   Yes [provider]  metFORMIN (GLUCOPHAGE-XR) 500 MG 24 hr tablet TAKE 4 TABLETS BY MOUTH IN THE MORNING 07/08/19  Yes Biagio Borg, MD  metoprolol tartrate (LOPRESSOR) 25 MG tablet TAKE 1/2 (ONE-HALF) TABLET BY MOUTH ONCE DAILY 07/14/19  Yes Biagio Borg, MD  Multiple Vitamin (MULTIVITAMIN) tablet Take 1 tablet by mouth daily.     Yes [provider]  rosuvastatin (CRESTOR) 40 MG tablet Take 1 tablet by mouth once daily 05/24/19  Yes Biagio Borg, MD  vitamin C (ASCORBIC ACID) 500 MG tablet Take 500 mg by mouth daily.   Yes [provider]  blood glucose meter kit and supplies Dispense based on patient and insurance preference. Use up to four times daily as directed. (E11.9). 10/16/18   Biagio Borg, MD  glucose blood (ONE TOUCH ULTRA TEST) test strip Use as directed three times per day E11.9 10/16/18   Biagio Borg, MD  Lancets MISC Use as directed three times per day E11.9 10/16/18   Biagio Borg, MD     Positive ROS: All other systems have been reviewed and were otherwise negative with the exception of those mentioned in the HPI and as above.  Physical Exam: General: Alert, no acute distress Cardiovascular: No  pedal edema Respiratory: No cyanosis, no use of accessory musculature GI: abdomen soft Skin: No lesions in the area of chief complaint Neurologic: Sensation intact distally Psychiatric: Patient is competent for consent with normal mood and affect Lymphatic: no lymphedema  MUSCULOSKELETAL: exam stable  Assessment: right thumb ingrown nail  Plan: Plan for Procedure(s): REMOVAL OF RIGHT THUMB INGROWN NAIL  The risks benefits and alternatives were discussed with the patient including but not limited to the risks of nonoperative treatment, versus surgical intervention including infection, bleeding, nerve injury,  blood clots, cardiopulmonary complications, morbidity, mortality, among others, and they were willing to proceed.   Eduard Roux, MD   08/18/2019 10:53 AM

## 2019-08-18 NOTE — Anesthesia Postprocedure Evaluation (Signed)
Anesthesia Post Note  Patient: Scott Valentine  Procedure(s) Performed: REMOVAL OF RIGHT THUMB INGROWN NAIL (Right Thumb)     Patient location during evaluation: PACU Anesthesia Type: MAC Level of consciousness: awake and alert and oriented Pain management: pain level controlled Vital Signs Assessment: post-procedure vital signs reviewed and stable Respiratory status: spontaneous breathing, nonlabored ventilation and respiratory function stable Cardiovascular status: blood pressure returned to baseline Postop Assessment: no apparent nausea or vomiting Anesthetic complications: no    Last Vitals:  Vitals:   08/18/19 0923 08/18/19 1135  BP: (!) 141/73 115/68  Pulse: (!) 49 (!) 50  Resp: 18 16  Temp: (!) 36.4 C   SpO2: 99% 100%    Last Pain:  Vitals:   08/18/19 1135  TempSrc:   PainSc: 0-No pain                 Brennan Bailey

## 2019-08-18 NOTE — Op Note (Signed)
   Date of Surgery: 08/18/2019  INDICATIONS: Scott Valentine is a 63 y.o.-year-old male with a right thumb ingrown nail;  The patient did consent to the procedure after discussion of the risks and benefits.  PREOPERATIVE DIAGNOSIS: Ingrown right thumbnail with recurrent paronychia  POSTOPERATIVE DIAGNOSIS: Same.  PROCEDURE: Partial removal of right thumbnail  SURGEON: N. Eduard Roux, M.D.  ASSIST: Ciro Backer [place], [place]; necessary for the timely completion of procedure and due to complexity of procedure.  ANESTHESIA:  Digital block and MAC  IV FLUIDS AND URINE: See anesthesia.  ESTIMATED BLOOD LOSS: minimal mL.  IMPLANTS: none  DRAINS: none  COMPLICATIONS: see description of procedure.  DESCRIPTION OF PROCEDURE: The patient was brought to the operating room and placed supine on the operating table.  The patient had been signed prior to the procedure and this was documented. The patient had the anesthesia placed by the anesthesiologist.  A time-out was performed to confirm that this was the correct patient, site, side and location. The patient did receive antibiotics prior to the incision and was re-dosed during the procedure as needed at indicated intervals.  The patient had the operative extremity prepped and draped in the standard surgical fashion.    A digital block was administered using 0.25% bupivacaine without epinephrine.  After adequate levels of anesthesia, I used a freer elevator to free the radial portion of the thumb nail off of the underlying nailbed and paronychium.  I then used tenotomy scissors to sharply remove a sliver of the radial portion of the thumbnail approximately 5 mm in width.  Freer elevator was then used to site under the cuticle.  There is no evidence of infection or drainage.  This was then thoroughly irrigated.  Mupirocin ointment was placed and a sterile bandage was applied.  Patient tolerated procedure well had no immediate complications.  POSTOPERATIVE  PLAN: Follow-up in 1 week  For wound check  N. Eduard Roux, MD 11:30 AM

## 2019-08-19 ENCOUNTER — Encounter: Payer: Self-pay | Admitting: *Deleted

## 2019-08-25 ENCOUNTER — Other Ambulatory Visit: Payer: Self-pay

## 2019-08-25 ENCOUNTER — Ambulatory Visit (INDEPENDENT_AMBULATORY_CARE_PROVIDER_SITE_OTHER): Payer: 59 | Admitting: Orthopaedic Surgery

## 2019-08-25 ENCOUNTER — Encounter: Payer: Self-pay | Admitting: Orthopaedic Surgery

## 2019-08-25 DIAGNOSIS — L600 Ingrowing nail: Secondary | ICD-10-CM

## 2019-08-25 NOTE — Progress Notes (Signed)
Patient ID: Scott Valentine, male   DOB: 1955/11/20, 63 y.o.   MRN: [id number]  [name] is 1 week status post partial removal of the right thumbnail.  He is doing well.  He reports no pain.  Bandage removed today and the thumb has no drainage.  No signs of infection.    Work note filled out today.  Wound care instructions reviewed and voiced understanding.  May follow up as needed

## 2019-08-28 ENCOUNTER — Other Ambulatory Visit: Payer: Self-pay | Admitting: Internal Medicine

## 2019-08-30 ENCOUNTER — Other Ambulatory Visit: Payer: Self-pay | Admitting: *Deleted

## 2019-08-31 ENCOUNTER — Ambulatory Visit: Payer: 59 | Admitting: Family Medicine

## 2019-09-05 ENCOUNTER — Other Ambulatory Visit: Payer: Self-pay | Admitting: Internal Medicine

## 2019-09-10 HISTORY — PX: COLONOSCOPY: SHX174

## 2019-09-28 ENCOUNTER — Telehealth: Payer: Self-pay | Admitting: Internal Medicine

## 2019-09-28 NOTE — Telephone Encounter (Signed)
Pt's spouse called in for assistance. She said that they was advised to call PCP because pt only received 3 day supply of metFORMIN (GLUCOPHAGE-XR) 500 MG 24 hr tablet.   They would like further assistance with this

## 2019-09-28 NOTE — Telephone Encounter (Addendum)
On 12.21.20, 90 day supply was sent in.   The sig is for 4 tablets in the morning, may require a PA. Will call the pharmacy today to confirm either way.

## 2019-09-29 MED ORDER — METFORMIN HCL ER 500 MG PO TB24: 2000.0000 mg | ORAL_TABLET | Freq: Every day | ORAL | 1 refills | Status: DC

## 2019-09-29 NOTE — Telephone Encounter (Signed)
Tried to call pharmacy x 7. Line rang busy. I went ahead and resent in the Metformin.

## 2019-10-07 ENCOUNTER — Ambulatory Visit: Payer: 59 | Admitting: Orthopedic Surgery

## 2019-10-27 ENCOUNTER — Encounter: Payer: Self-pay | Admitting: Internal Medicine

## 2019-11-29 ENCOUNTER — Other Ambulatory Visit: Payer: Self-pay

## 2019-11-29 ENCOUNTER — Other Ambulatory Visit: Payer: Self-pay | Admitting: Internal Medicine

## 2019-11-29 ENCOUNTER — Ambulatory Visit (AMBULATORY_SURGERY_CENTER): Payer: Self-pay | Admitting: *Deleted

## 2019-11-29 VITALS — Temp 97.1°F | Ht 69.0 in | Wt 210.0 lb

## 2019-11-29 DIAGNOSIS — Z860100 Personal history of colon polyps, unspecified: Secondary | ICD-10-CM

## 2019-11-29 MED ORDER — SUPREP BOWEL PREP KIT 17.5-3.13-1.6 GM/177ML PO SOLN: 1.0000 | Freq: Once | ORAL | 0 refills | Status: AC

## 2019-11-29 NOTE — Progress Notes (Signed)
No egg or soy allergy known to patient  No issues with past sedation with any surgeries  or procedures, no intubation problems  No diet pills per patient No home 02 use per patient  No blood thinners per patient  Pt denies issues with constipation  No A fib or A flutter  EMMI video sent to pt's e mail   Due to the COVID-19 pandemic we are asking patients to follow these guidelines. Please only bring one care partner. Please be aware that your care partner may wait in the car in the parking lot or if they feel like they will be too hot to wait in the car, they may wait in the lobby on the 4th floor. All care partners are required to wear a mask the entire time (we do not have any that we can provide them), they need to practice social distancing, and we will do a Covid check for all patient's and care partners when you arrive. Also we will check their temperature and your temperature. If the care partner waits in their car they need to stay in the parking lot the entire time and we will call them on their cell phone when the patient is ready for discharge so they can bring the car to the front of the building. Also all patient's will need to wear a mask into building.  Suprep Code and COupon to pt today in [place]

## 2019-12-09 ENCOUNTER — Encounter: Payer: Self-pay | Admitting: Internal Medicine

## 2019-12-13 ENCOUNTER — Encounter: Payer: Self-pay | Admitting: Internal Medicine

## 2019-12-13 ENCOUNTER — Other Ambulatory Visit: Payer: Self-pay

## 2019-12-13 ENCOUNTER — Ambulatory Visit (AMBULATORY_SURGERY_CENTER): Payer: 59 | Admitting: Internal Medicine

## 2019-12-13 VITALS — BP 114/72 | HR 55 | Temp 96.2°F | Resp 14 | Ht 69.0 in | Wt 210.0 lb

## 2019-12-13 DIAGNOSIS — D124 Benign neoplasm of descending colon: Secondary | ICD-10-CM

## 2019-12-13 DIAGNOSIS — Z860100 Personal history of colon polyps, unspecified: Secondary | ICD-10-CM

## 2019-12-13 DIAGNOSIS — D122 Benign neoplasm of ascending colon: Secondary | ICD-10-CM

## 2019-12-13 DIAGNOSIS — K635 Polyp of colon: Secondary | ICD-10-CM

## 2019-12-13 MED ORDER — SODIUM CHLORIDE 0.9 % IV SOLN: 500.0000 mL | Freq: Once | INTRAVENOUS | Status: DC

## 2019-12-13 NOTE — Progress Notes (Signed)
Report to PACU, RN, vss, BBS= Clear.  

## 2019-12-13 NOTE — Patient Instructions (Signed)
Handouts given for polyps, diverticulosis and hemorrhoids.  Await pathology results.  YOU HAD AN ENDOSCOPIC PROCEDURE TODAY AT THE [facility]:   Refer to the procedure report that was given to you for any specific questions about what was found during the examination.  If the procedure report does not answer your questions, please call your gastroenterologist to clarify.  If you requested that your care partner not be given the details of your procedure findings, then the procedure report has been included in a sealed envelope for you to review at your convenience later.  YOU SHOULD EXPECT: Some feelings of bloating in the abdomen. Passage of more gas than usual.  Walking can help get rid of the air that was put into your GI tract during the procedure and reduce the bloating. If you had a lower endoscopy (such as a colonoscopy or flexible sigmoidoscopy) you may notice spotting of blood in your stool or on the toilet paper. If you underwent a bowel prep for your procedure, you may not have a normal bowel movement for a few days.  Please Note:  You might notice some irritation and congestion in your nose or some drainage.  This is from the oxygen used during your procedure.  There is no need for concern and it should clear up in a day or so.  SYMPTOMS TO REPORT IMMEDIATELY:   Following lower endoscopy (colonoscopy or flexible sigmoidoscopy):  Excessive amounts of blood in the stool  Significant tenderness or worsening of abdominal pains  Swelling of the abdomen that is new, acute  Fever of 100F or higher   For urgent or emergent issues, a gastroenterologist can be reached at any hour by calling [phone]. Do not use MyChart messaging for urgent concerns.    DIET:  We do recommend a small meal at first, but then you may proceed to your regular diet.  Drink plenty of fluids but you should avoid alcoholic beverages for 24 hours.  ACTIVITY:  You should plan to take it easy for  the rest of today and you should NOT DRIVE or use heavy machinery until tomorrow (because of the sedation medicines used during the test).    FOLLOW UP: Our staff will call the number listed on your records 48-72 hours following your procedure to check on you and address any questions or concerns that you may have regarding the information given to you following your procedure. If we do not reach you, we will leave a message.  We will attempt to reach you two times.  During this call, we will ask if you have developed any symptoms of COVID 19. If you develop any symptoms (ie: fever, flu-like symptoms, shortness of breath, cough etc.) before then, please call [phone].  If you test positive for Covid 19 in the 2 weeks post procedure, please call and report this information to us.    If any biopsies were taken you will be contacted by phone or by letter within the next 1-3 weeks.  Please call us at [phone] if you have not heard about the biopsies in 3 weeks.    SIGNATURES/CONFIDENTIALITY: You and/or your care partner have signed paperwork which will be entered into your electronic medical record.  These signatures attest to the fact that that the information above on your After Visit Summary has been reviewed and is understood.  Full responsibility of the confidentiality of this discharge information lies with you and/or your care-partner. 

## 2019-12-13 NOTE — Op Note (Signed)
[facility] Patient Name: Scott Valentine Procedure Date: 12/13/2019 9:38 AM MRN: [id number] Endoscopist: Docia Chuck. Henrene Pastor , MD Age: 64 Referring MD:  Date of Birth: February 08, 1956 Gender: Male Account #: [id number] Procedure:                Colonoscopy with cold snare polypectomy x 2 Indications:              High risk colon cancer surveillance: Personal                            history of multiple (3 or more) adenomas. Prior                            examinations 2005, 2011, 2016 with multiple                            hyperplastic polyps and tubular adenomas Medicines:                Monitored Anesthesia Care Procedure:                Pre-Anesthesia Assessment:                           - Prior to the procedure, a History and Physical                            was performed, and patient medications and                            allergies were reviewed. The patient's tolerance of                            previous anesthesia was also reviewed. The risks                            and benefits of the procedure and the sedation                            options and risks were discussed with the patient.                            All questions were answered, and informed consent                            was obtained. Prior Anticoagulants: The patient has                            taken no previous anticoagulant or antiplatelet                            agents. ASA Grade Assessment: II - A patient with                            mild systemic disease. After reviewing the risks  and benefits, the patient was deemed in                            satisfactory condition to undergo the procedure.                           After obtaining informed consent, the colonoscope                            was passed under direct vision. Throughout the                            procedure, the patient's blood pressure, pulse, and   oxygen saturations were monitored continuously. The                            Colonoscope was introduced through the anus and                            advanced to the the cecum, identified by                            appendiceal orifice and ileocecal valve. The                            ileocecal valve, appendiceal orifice, and rectum                            were photographed. The quality of the bowel                            preparation was good. The colonoscopy was performed                            without difficulty. The patient tolerated the                            procedure well. The bowel preparation used was                            SUPREP via split dose instruction. Scope In: 9:49:52 AM Scope Out: 10:09:54 AM Scope Withdrawal Time: 0 hours 16 minutes 3 seconds  Total Procedure Duration: 0 hours 20 minutes 2 seconds  Findings:                 Two polyps were found in the descending colon and                            ascending colon. The polyps were 2 to 4 mm in size.                            These polyps were removed with a cold snare.  Resection and retrieval were complete.                           Multiple diverticula were found in the left colon.                           Internal hemorrhoids were found during retroflexion.                           The exam was otherwise without abnormality on                            direct and retroflexion views. Complications:            No immediate complications. Estimated blood loss:                            None. Estimated Blood Loss:     Estimated blood loss: none. Impression:               - Two 2 to 4 mm polyps in the descending colon and                            in the ascending colon, removed with a cold snare.                            Resected and retrieved.                           - Diverticulosis in the left colon.                           - Internal hemorrhoids.                            - The examination was otherwise normal on direct                            and retroflexion views. Recommendation:           - Repeat colonoscopy in 5 years for surveillance.                           - Patient has a contact number available for                            emergencies. The signs and symptoms of potential                            delayed complications were discussed with the                            patient. Return to normal activities tomorrow.                            Written discharge instructions were provided to the  patient.                           - Resume previous diet.                           - Continue present medications.                           - Await pathology results. Docia Chuck. Henrene Pastor, MD 12/13/2019 10:16:14 AM This report has been signed electronically.

## 2019-12-13 NOTE — Progress Notes (Signed)
Called to room to assist during endoscopic procedure.  Patient ID and intended procedure confirmed with present staff. Received instructions for my participation in the procedure from the performing physician.  

## 2019-12-13 NOTE — Progress Notes (Signed)
Temp check by:LC Vital check by:CW  The patient states no changes in medical or surgical history since pre-visit screening on 11/29/19.

## 2019-12-15 ENCOUNTER — Telehealth: Payer: Self-pay

## 2019-12-15 ENCOUNTER — Encounter: Payer: Self-pay | Admitting: Internal Medicine

## 2019-12-15 NOTE — Telephone Encounter (Signed)
First post procedure follow up call, no answer 

## 2019-12-15 NOTE — Telephone Encounter (Signed)
Left message on 2nd follow up call. 

## 2020-01-02 ENCOUNTER — Other Ambulatory Visit: Payer: Self-pay | Admitting: Internal Medicine

## 2020-01-02 NOTE — Telephone Encounter (Signed)
Please refill as per office routine med refill policy (all routine meds refilled for 3 mo or monthly per pt preference up to one year from last visit, then month to month grace period for 3 mo, then further med refills will have to be denied)  

## 2020-01-17 ENCOUNTER — Other Ambulatory Visit: Payer: Self-pay | Admitting: Internal Medicine

## 2020-01-18 ENCOUNTER — Other Ambulatory Visit (INDEPENDENT_AMBULATORY_CARE_PROVIDER_SITE_OTHER): Payer: 59

## 2020-01-18 DIAGNOSIS — Z0000 Encounter for general adult medical examination without abnormal findings: Secondary | ICD-10-CM | POA: Diagnosis not present

## 2020-01-18 DIAGNOSIS — E119 Type 2 diabetes mellitus without complications: Secondary | ICD-10-CM | POA: Diagnosis not present

## 2020-01-18 DIAGNOSIS — Z125 Encounter for screening for malignant neoplasm of prostate: Secondary | ICD-10-CM | POA: Diagnosis not present

## 2020-01-18 LAB — LIPID PANEL
Cholesterol: 175 mg/dL (ref 0–200)
HDL: 34.2 mg/dL — ABNORMAL LOW (ref >39.00)
NonHDL: 140.63
Total CHOL/HDL Ratio: 5
Triglycerides: 248 mg/dL — ABNORMAL HIGH (ref 0.0–149.0)
VLDL: 49.6 mg/dL — ABNORMAL HIGH (ref 0.0–40.0)

## 2020-01-18 LAB — BASIC METABOLIC PANEL
BUN: 20 mg/dL (ref 6–23)
CO2: 26 mEq/L (ref 19–32)
Calcium: 9.5 mg/dL (ref 8.4–10.5)
Chloride: 102 mEq/L (ref 96–112)
Creatinine, Ser: 1.02 mg/dL (ref 0.40–1.50)
GFR: 73.99 mL/min (ref >60.00)
Glucose, Bld: 173 mg/dL — ABNORMAL HIGH (ref 70–99)
Potassium: 4.8 mEq/L (ref 3.5–5.1)
Sodium: 137 mEq/L (ref 135–145)

## 2020-01-18 LAB — HEPATIC FUNCTION PANEL
ALT: 15 U/L (ref 0–53)
AST: 19 U/L (ref 0–37)
Albumin: 4.7 g/dL (ref 3.5–5.2)
Alkaline Phosphatase: 45 U/L (ref 39–117)
Bilirubin, Direct: 0.1 mg/dL (ref 0.0–0.3)
Total Bilirubin: 0.5 mg/dL (ref 0.2–1.2)
Total Protein: 7.4 g/dL (ref 6.0–8.3)

## 2020-01-18 LAB — CBC WITH DIFFERENTIAL/PLATELET
Basophils Absolute: 0.1 10*3/uL (ref 0.0–0.1)
Basophils Relative: 1 % (ref 0.0–3.0)
Eosinophils Absolute: 0.4 10*3/uL (ref 0.0–0.7)
Eosinophils Relative: 4.8 % (ref 0.0–5.0)
HCT: 45.2 % (ref 39.0–52.0)
Hemoglobin: 15.6 g/dL (ref 13.0–17.0)
Lymphocytes Relative: 14.5 % (ref 12.0–46.0)
Lymphs Abs: 1.1 10*3/uL (ref 0.7–4.0)
MCHC: 34.5 g/dL (ref 30.0–36.0)
MCV: 90.9 fl (ref 78.0–100.0)
Monocytes Absolute: 0.6 10*3/uL (ref 0.1–1.0)
Monocytes Relative: 7.2 % (ref 3.0–12.0)
Neutro Abs: 5.6 10*3/uL (ref 1.4–7.7)
Neutrophils Relative %: 72.5 % (ref 43.0–77.0)
Platelets: 216 10*3/uL (ref 150.0–400.0)
RBC: 4.97 Mil/uL (ref 4.22–5.81)
RDW: 13.2 % (ref 11.5–15.5)
WBC: 7.7 10*3/uL (ref 4.0–10.5)

## 2020-01-18 LAB — URINALYSIS, ROUTINE W REFLEX MICROSCOPIC
Bilirubin Urine: NEGATIVE
Hgb urine dipstick: NEGATIVE
Ketones, ur: NEGATIVE
Leukocytes,Ua: NEGATIVE
Nitrite: NEGATIVE
RBC / HPF: NONE SEEN (ref 0–?)
Specific Gravity, Urine: 1.02 (ref 1.000–1.030)
Total Protein, Urine: NEGATIVE
Urine Glucose: >=1000 — AB
Urobilinogen, UA: 0.2 (ref 0.0–1.0)
pH: 5.5 (ref 5.0–8.0)

## 2020-01-18 LAB — PSA: PSA: 0.85 ng/mL (ref 0.10–4.00)

## 2020-01-18 LAB — MICROALBUMIN / CREATININE URINE RATIO
Creatinine,U: 49.6 mg/dL
Microalb Creat Ratio: 1.4 mg/g (ref 0.0–30.0)
Microalb, Ur: <0.7 mg/dL (ref 0.0–1.9)

## 2020-01-18 LAB — TSH: TSH: 2.74 u[IU]/mL (ref 0.35–4.50)

## 2020-01-18 LAB — LDL CHOLESTEROL, DIRECT: Direct LDL: 100 mg/dL

## 2020-01-18 LAB — HEMOGLOBIN A1C: Hgb A1c MFr Bld: 7.6 % — ABNORMAL HIGH (ref 4.6–6.5)

## 2020-01-23 ENCOUNTER — Other Ambulatory Visit: Payer: Self-pay | Admitting: Internal Medicine

## 2020-01-23 NOTE — Telephone Encounter (Signed)
Done erx 

## 2020-01-26 ENCOUNTER — Other Ambulatory Visit: Payer: Self-pay

## 2020-01-26 ENCOUNTER — Encounter: Payer: Self-pay | Admitting: Internal Medicine

## 2020-01-26 ENCOUNTER — Ambulatory Visit: Payer: PRIVATE HEALTH INSURANCE | Admitting: Internal Medicine

## 2020-01-26 VITALS — BP 130/80 | HR 61 | Temp 98.5°F | Ht 69.0 in | Wt 211.0 lb

## 2020-01-26 DIAGNOSIS — Z0000 Encounter for general adult medical examination without abnormal findings: Secondary | ICD-10-CM

## 2020-01-26 DIAGNOSIS — E559 Vitamin D deficiency, unspecified: Secondary | ICD-10-CM

## 2020-01-26 DIAGNOSIS — E119 Type 2 diabetes mellitus without complications: Secondary | ICD-10-CM

## 2020-01-26 DIAGNOSIS — E538 Deficiency of other specified B group vitamins: Secondary | ICD-10-CM

## 2020-01-26 DIAGNOSIS — F17200 Nicotine dependence, unspecified, uncomplicated: Secondary | ICD-10-CM

## 2020-01-26 MED ORDER — ESCITALOPRAM OXALATE 20 MG PO TABS: 20.0000 mg | ORAL_TABLET | Freq: Every day | ORAL | 3 refills | Status: DC

## 2020-01-26 MED ORDER — JARDIANCE 25 MG PO TABS: 25.0000 mg | ORAL_TABLET | Freq: Every day | ORAL | 3 refills | Status: DC

## 2020-01-26 MED ORDER — FENOFIBRATE 160 MG PO TABS: 160.0000 mg | ORAL_TABLET | Freq: Every day | ORAL | 3 refills | Status: DC

## 2020-01-26 MED ORDER — LOSARTAN POTASSIUM 50 MG PO TABS: 50.0000 mg | ORAL_TABLET | Freq: Every day | ORAL | 3 refills | Status: DC

## 2020-01-26 MED ORDER — FOLIC ACID 1 MG PO TABS: 1.0000 mg | ORAL_TABLET | Freq: Every day | ORAL | 3 refills | Status: DC

## 2020-01-26 MED ORDER — METFORMIN HCL ER 500 MG PO TB24: 2000.0000 mg | ORAL_TABLET | Freq: Every day | ORAL | 3 refills | Status: DC

## 2020-01-26 MED ORDER — METOPROLOL TARTRATE 25 MG PO TABS: ORAL_TABLET | ORAL | 3 refills | Status: DC

## 2020-01-26 MED ORDER — FEXOFENADINE HCL 180 MG PO TABS: 180.0000 mg | ORAL_TABLET | Freq: Every day | ORAL | 3 refills | Status: AC | PRN

## 2020-01-26 MED ORDER — GLIPIZIDE ER 10 MG PO TB24: ORAL_TABLET | ORAL | 3 refills | Status: DC

## 2020-01-26 MED ORDER — ROSUVASTATIN CALCIUM 40 MG PO TABS: 40.0000 mg | ORAL_TABLET | Freq: Every day | ORAL | 3 refills | Status: DC

## 2020-01-26 NOTE — Patient Instructions (Signed)

## 2020-01-26 NOTE — Progress Notes (Signed)
Subjective:    Patient ID: Scott Valentine, male    DOB: [birth year], 64 y.o.   MRN: [id number]  HPI  Here for wellness and f/u;  Overall doing ok;  Pt denies Chest pain, worsening SOB, DOE, wheezing, orthopnea, PND, worsening LE edema, palpitations, dizziness or syncope.  Pt denies neurological change such as new headache, facial or extremity weakness.  Pt denies polydipsia, polyuria, or low sugar symptoms. Pt states overall good compliance with treatment and medications, good tolerability, and has been trying to follow appropriate diet.  Pt denies worsening depressive symptoms, suicidal ideation or panic. No fever, night sweats, wt loss, loss of appetite, or other constitutional symptoms.  Pt states good ability with ADL's, has low fall risk, home safety reviewed and adequate, no other significant changes in hearing or vision, and only occasionally active with exercise.  Now retired x 19 days.  Sees podiatry every 3 mo. Plans to see eye doctor soon.  Insurance changed so jardiance not well covered, Plans to be more active and does not want increased DM meds.  Still smoking, not ready to quit Past Medical History:  Diagnosis Date  . Allergy   . Anxiety state, unspecified 10/08/2007  . Carpal tunnel syndrome   . Cervical radiculopathy    numbness in finger tips after cervical surgery   . Clotting disorder (HCC)    DVT - secondary vein left leg   . Cough 11/08/2009  . Diabetes mellitus without complication ([place])   . DVT (deep venous thrombosis) ([place]) 2020   right leg, dissolved itself  . Dysmetabolic syndrome X [id number]  . GERD 05/23/2008   past hx - not current issue   . GLUCOSE INTOLERANCE 10/19/2008  . HYPERLIPIDEMIA 10/08/2007  . HYPERPLASIA PROSTATE UNS W/O UR OBST & OTH LUTS 10/08/2007  . Impaired glucose tolerance 12/30/2010  . Other and unspecified hyperlipidemia 05/01/2007  . Rectal bleeding   . 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 TOBACCO USE, QUIT 10/08/2007  . UNS ADVRS EFF  UNS RX MEDICINAL&BIOLOGICAL SBSTNC 12/17/2007  . Unspecified essential hypertension 05/01/2007   BP increases with being upset   . URI 10/29/2007   Past Surgical History:  Procedure Laterality Date  . ANAL FISSURE REPAIR  04/25/11  . COLONOSCOPY    . DIGIT NAIL REMOVAL Right 08/18/2019   Procedure: REMOVAL OF RIGHT THUMB INGROWN NAIL;  Surgeon: Leandrew Koyanagi, MD;  Location: [facility];  Service: Orthopedics;  Laterality: Right;  . finger surgury' Left 05/30/2008   3rd finger  . HEMORRHOID SURGERY  04/25/11  . POLYPECTOMY    . POSTERIOR CERVICAL LAMINECTOMY Right 10/21/2018   Procedure: Right Cervical five-six Cervical six-seven Posterior cervical foraminotomies;  Surgeon: Judith Part, MD;  Location: [facility];  Service: Neurosurgery;  Laterality: Right;    reports that he has been smoking. He has a 44.00 pack-year smoking history. He has quit using smokeless tobacco. He reports that he does not drink alcohol or use drugs. family history includes Coronary artery disease (age of onset: 54) in his father; Diabetes in his mother; Heart attack in his paternal grandfather; Heart disease in his brother and father; Lung cancer in his father; Mental illness in his mother; Prostate cancer in his paternal grandfather. Allergies  Allergen Reactions  . Sulfonamide Derivatives Other (See Comments)    stevens johnson  . Atorvastatin Other (See Comments)    myalgias  . Ezetimibe Other (See Comments)    myalgias   Current Outpatient Medications on  File Prior to Visit  Medication Sig Dispense Refill  . aspirin EC 81 MG tablet Take 81 mg by mouth daily.    Marland Kitchen LORazepam (ATIVAN) 0.5 MG tablet Take 1 tablet by mouth twice daily as needed for anxiety 60 tablet 5  . magnesium oxide (MAG-OX) 400 MG tablet Take 400 mg by mouth daily.    . Multiple Vitamin (MULTIVITAMIN) tablet Take 1 tablet by mouth daily.      . vitamin C (ASCORBIC ACID) 500 MG tablet Take 500 mg by mouth daily.     No current  facility-administered medications on file prior to visit.   Review of Systems All otherwise neg per pt     Objective:   Physical Exam BP 130/80 (BP Location: Left Arm, Patient Position: Sitting, Cuff Size: Large)   Pulse 61   Temp 98.5 F (36.9 C) (Oral)   Ht 5\' 9"  (1.753 m)   Wt 211 lb (95.7 kg)   SpO2 98%   BMI 31.16 kg/m  VS noted,  Constitutional: Pt appears in NAD HENT: Head: NCAT.  Right Ear: External ear normal.  Left Ear: External ear normal.  Eyes: . Pupils are equal, round, and reactive to light. Conjunctivae and EOM are normal Nose: without d/c or deformity Neck: Neck supple. Gross normal ROM Cardiovascular: Normal rate and regular rhythm.   Pulmonary/Chest: Effort normal and breath sounds without rales or wheezing.  Abd:  Soft, NT, ND, + BS, no organomegaly Neurological: Pt is alert. At baseline orientation, motor grossly intact Skin: Skin is warm. No rashes, other new lesions, no LE edema Psychiatric: Pt behavior is normal without agitation  All otherwise neg per pt  Lab Results  Component Value Date   WBC 7.7 01/18/2020   HGB 15.6 01/18/2020   HCT 45.2 01/18/2020   PLT 216.0 01/18/2020   GLUCOSE 173 (H) 01/18/2020   CHOL 175 01/18/2020   TRIG 248.0 (H) 01/18/2020   HDL 34.20 (L) 01/18/2020   LDLDIRECT 100.0 01/18/2020   LDLCALC 79 10/09/2018   ALT 15 01/18/2020   AST 19 01/18/2020   NA 137 01/18/2020   K 4.8 01/18/2020   CL 102 01/18/2020   CREATININE 1.02 01/18/2020   BUN 20 01/18/2020   CO2 26 01/18/2020   TSH 2.74 01/18/2020   PSA 0.85 01/18/2020   HGBA1C 7.6 (H) 01/18/2020   MICROALBUR <0.7 01/18/2020      Assessment & Plan:

## 2020-01-29 ENCOUNTER — Encounter: Payer: Self-pay | Admitting: Internal Medicine

## 2020-01-29 NOTE — Assessment & Plan Note (Signed)
Urged to quit 

## 2020-01-29 NOTE — Assessment & Plan Note (Signed)

## 2020-01-29 NOTE — Assessment & Plan Note (Signed)
stable overall by history and exam, recent data reviewed with pt, and pt to continue medical treatment as before,  to f/u any worsening symptoms or concerns  

## 2020-02-15 IMAGING — MR MR CERVICAL SPINE W/O CM
4 of 5 series · 18 of 48 positions shown · IV contrast (Yes)
Comparison: None.

CLINICAL DATA: Initial evaluation for cervical radiculopathy.

EXAM:
MRI CERVICAL SPINE WITHOUT CONTRAST
TECHNIQUE: Multiplanar, multisequence MR imaging of the cervical spine was
performed. No intravenous contrast was administered.

[Series 2: T1 · sagittal · 3.0mm · 0.41mm/px · 3 of 12 slices shown]
[im 2/12]
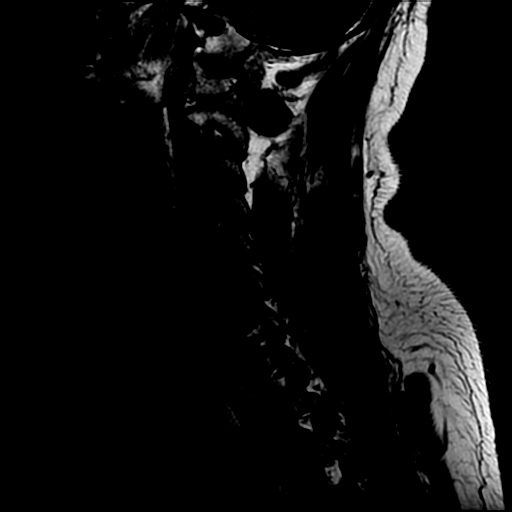
[im 6/12]
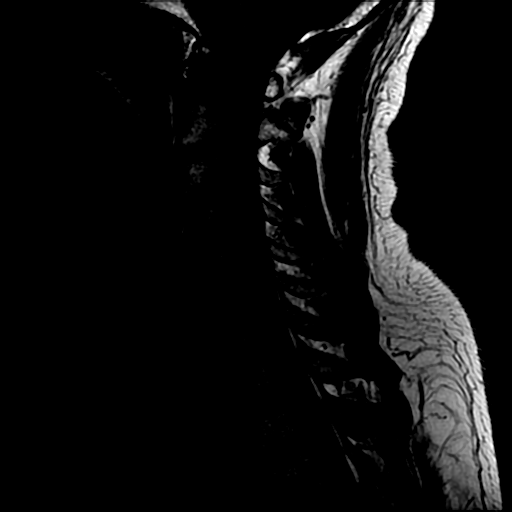
[im 10/12]
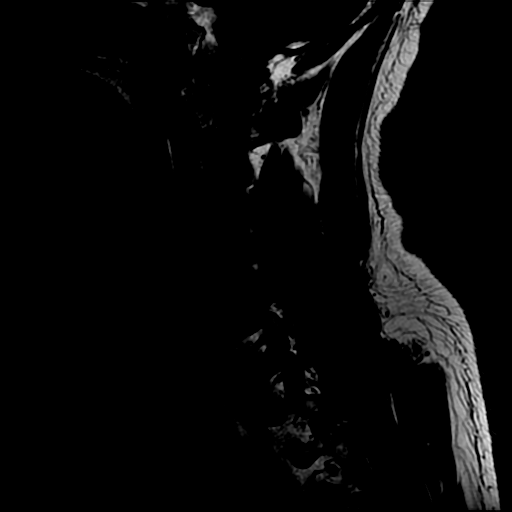

[Series 3: sag ir · sagittal · 3.0mm · 0.41mm/px · 3 of 12 slices shown]
[im 3/12]
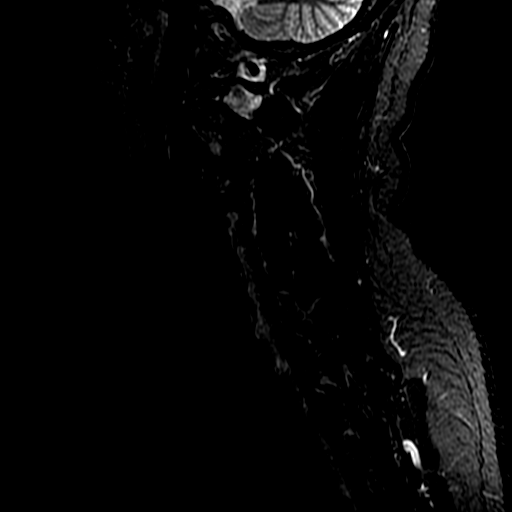
[im 7/12]
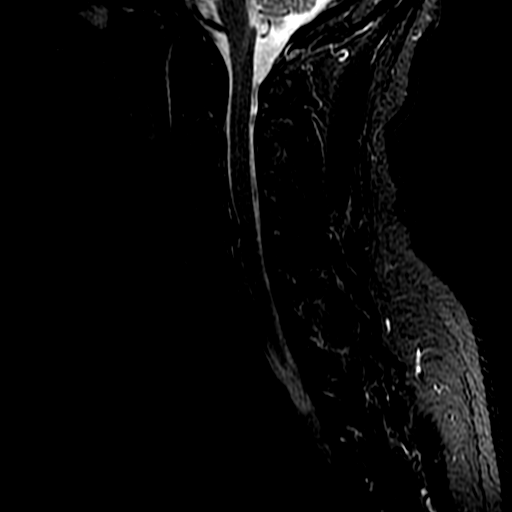
[im 12/12]
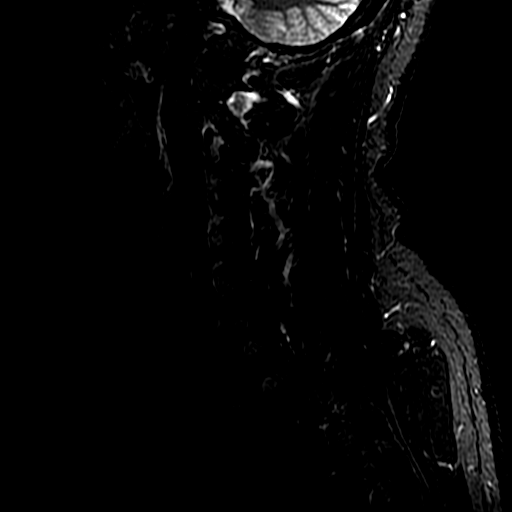

[Series 4: T2 post-contrast · sagittal · 3.0mm · 0.41mm/px · 6 of 12 slices shown]
[im 1/12]
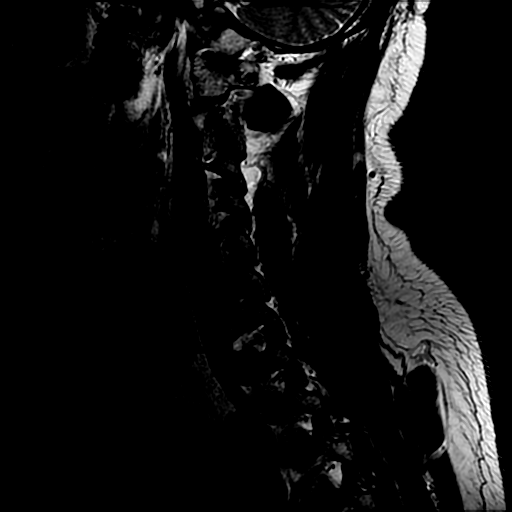
[im 3/12]
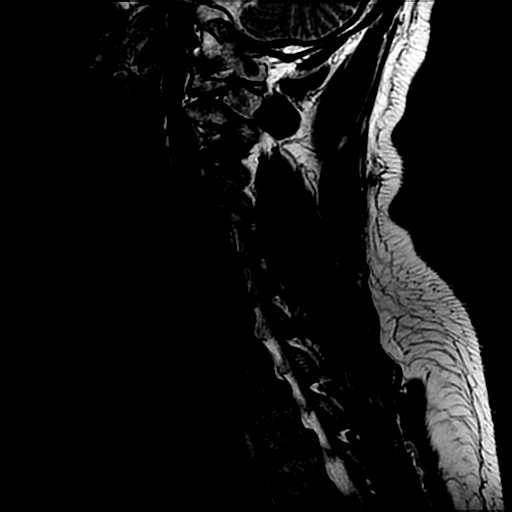
[im 5/12]
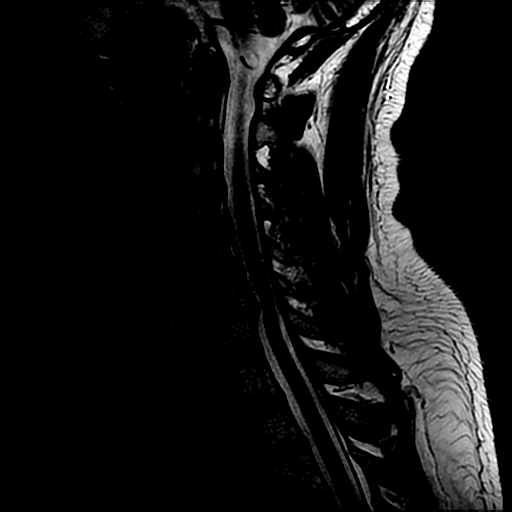
[im 7/12]
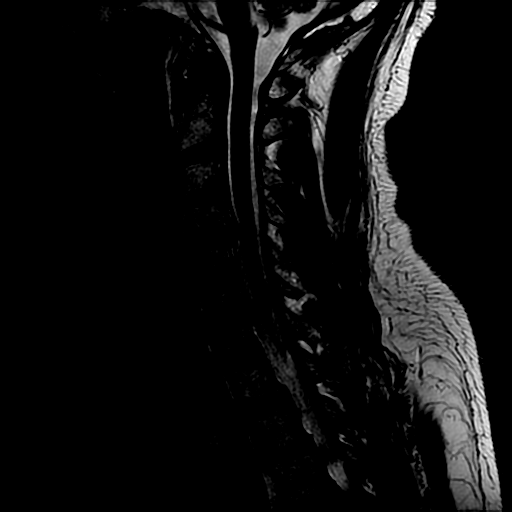
[im 9/12]
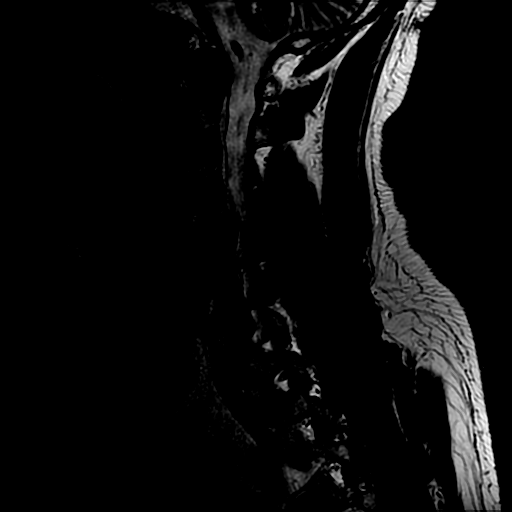
[im 12/12]
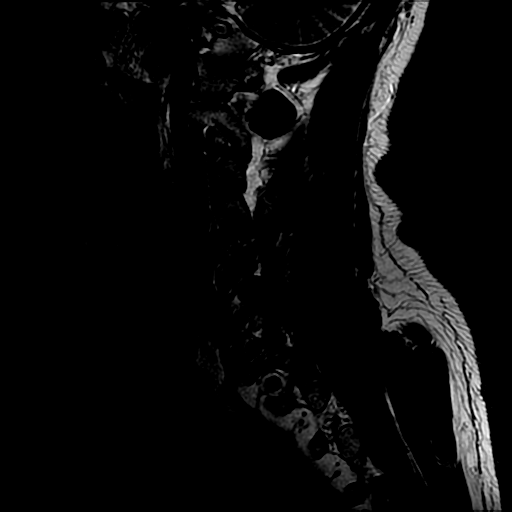

[Series 6: T2 · axial · 3.0mm · 0.35mm/px · z∈[-54,+27]mm · 6 of 29 slices shown]
[im 1/29]
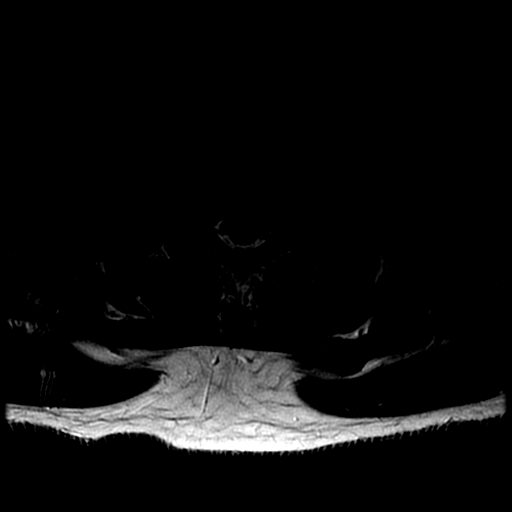
[im 5/29]
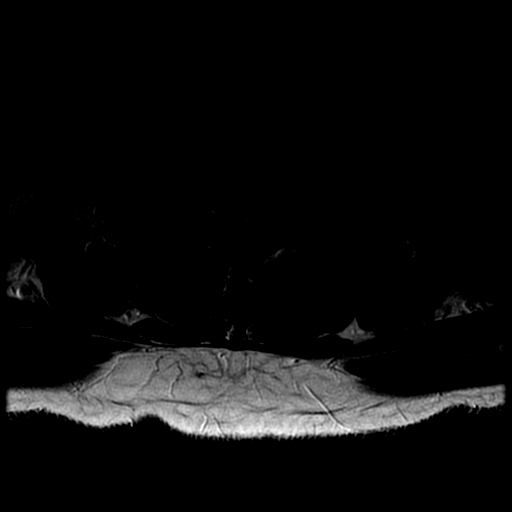
[im 9/29]
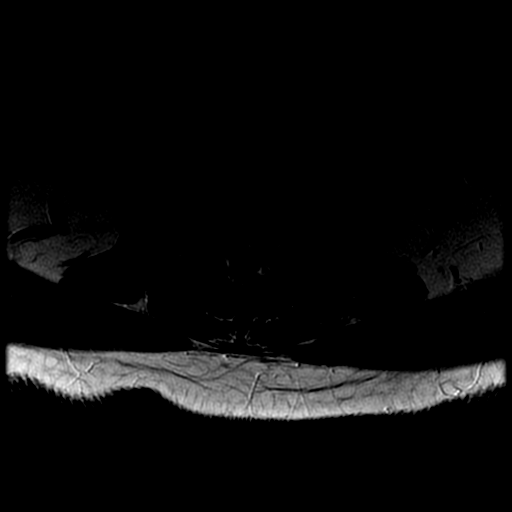
[im 13/29]
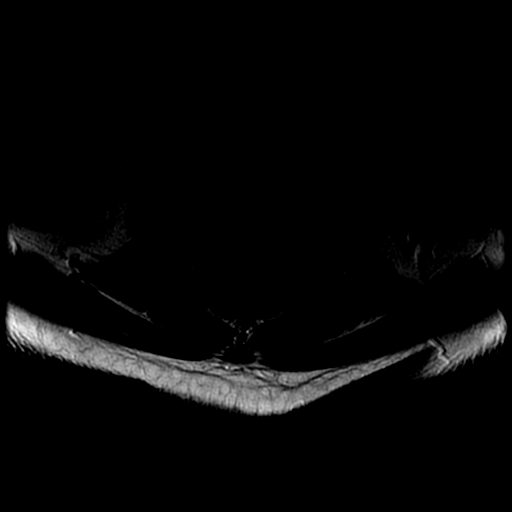
[im 15/29]
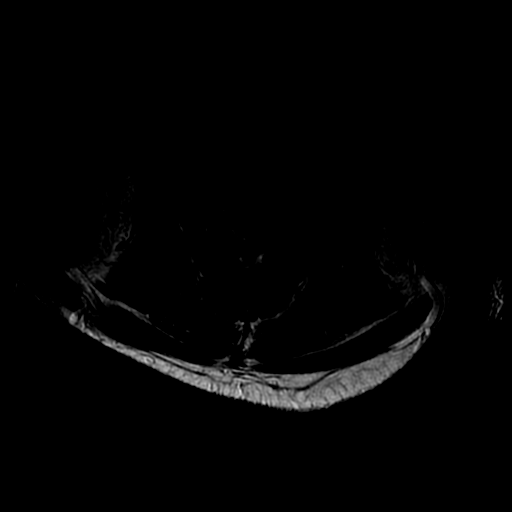
[im 25/29]
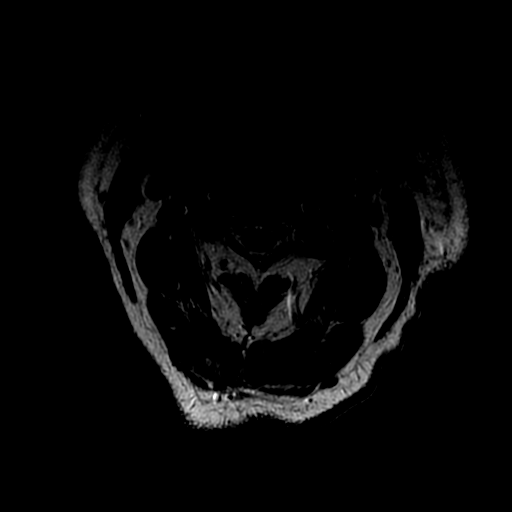

[18 of 48 positions shown; findings below may reference images not displayed]

FINDINGS: Alignment: Physiologic.

Vertebrae: Vertebral body heights maintained without evidence for
acute or chronic fracture. Bone marrow signal intensity normal. No
discrete or worrisome osseous lesions. No abnormal marrow edema.

Cord: Signal intensity within the cervical spinal cord is normal.

Posterior Fossa, vertebral arteries, paraspinal tissues:
Craniocervical junction normal. Paraspinous soft tissues within
normal limits. Normal intravascular flow voids seen within the
vertebral arteries bilaterally.

Disc levels:

C2-C3: Mild right-sided facet hypertrophy.  No stenosis.

C3-C4: Mild disc bulge with uncovertebral hypertrophy, greater on
the right. No significant canal or foraminal stenosis.

C4-C5: Lobulated central/left paracentral disc protrusion with
lateral extension towards the left neural foramen (series 5, image
13). Protruding disc contacts and mildly flattens the left hemi
cord. Mild spinal stenosis. Moderate left with mild right C5
foraminal stenosis.

C5-C6: Shallow right paracentral disc protrusion indents the right
ventral thecal sac. Mild flattening of the right hemi cord with mild
spinal stenosis. Moderate right with mild left C6 foraminal
stenosis.

C6-C7: Chronic intervertebral disc space narrowing with disc bulge
and uncovertebral hypertrophy. Superimposed broad right paracentral
disc osteophyte complex indents the right ventral thecal sac,
flattening the right hemi cord. Resultant mild to moderate
right-sided spinal stenosis. Mild to moderate bilateral C7 foraminal
narrowing.

C7-T1:  Unremarkable.

Visualized upper thoracic spine demonstrates no significant finding.
IMPRESSION: 1. Lobulated central/left paracentral disc protrusion at C4-5 with
resultant flattening of the left hemi cord and moderate left C5
foraminal stenosis.
2. Shallow right paracentral disc protrusion at C5-6 with mild
flattening of the right hemi cord and moderate right C6 foraminal
stenosis.
3. Right paracentral disc osteophyte complex at C6-7 with secondary
flattening of the right hemi cord and resulting in mild to moderate
spinal stenosis.

## 2020-03-03 ENCOUNTER — Telehealth: Payer: Self-pay

## 2020-03-03 NOTE — Telephone Encounter (Signed)
New message   Pt c/o medication issue:  1. Name of Medication: empagliflozin (JARDIANCE) 25 MG TABS tablet  2. How are you currently taking this medication (dosage and times per day)? Once a day   3. Are you having a reaction (difficulty breathing--STAT)? No   4. What is your medication issue? Insurance has denial prescription / the prior authorization box was not check he already taken metFORMIN (GLUCOPHAGE-XR) 500 MG 24 hr tablet & glipiZIDE (GLUCOTROL XL) 10 MG 24 hr tablet   5. [place], [place] [address]

## 2020-03-06 NOTE — Telephone Encounter (Signed)
It looks like there was a PA for the Jardiance and it was denied. An appeal may need to be done stating that patient is currently taking metformin and glipizide

## 2020-04-14 ENCOUNTER — Other Ambulatory Visit (INDEPENDENT_AMBULATORY_CARE_PROVIDER_SITE_OTHER): Payer: PRIVATE HEALTH INSURANCE

## 2020-04-14 DIAGNOSIS — E538 Deficiency of other specified B group vitamins: Secondary | ICD-10-CM

## 2020-04-14 DIAGNOSIS — E559 Vitamin D deficiency, unspecified: Secondary | ICD-10-CM | POA: Diagnosis not present

## 2020-04-14 DIAGNOSIS — E119 Type 2 diabetes mellitus without complications: Secondary | ICD-10-CM

## 2020-04-14 LAB — BASIC METABOLIC PANEL
BUN: 17 mg/dL (ref 6–23)
CO2: 27 mEq/L (ref 19–32)
Calcium: 9.5 mg/dL (ref 8.4–10.5)
Chloride: 103 mEq/L (ref 96–112)
Creatinine, Ser: 1.11 mg/dL (ref 0.40–1.50)
GFR: 67.05 mL/min (ref >60.00)
Glucose, Bld: 156 mg/dL — ABNORMAL HIGH (ref 70–99)
Potassium: 4.5 mEq/L (ref 3.5–5.1)
Sodium: 137 mEq/L (ref 135–145)

## 2020-04-14 LAB — LIPID PANEL
Cholesterol: 153 mg/dL (ref 0–200)
HDL: 40 mg/dL (ref >39.00)
NonHDL: 113.13
Total CHOL/HDL Ratio: 4
Triglycerides: 203 mg/dL — ABNORMAL HIGH (ref 0.0–149.0)
VLDL: 40.6 mg/dL — ABNORMAL HIGH (ref 0.0–40.0)

## 2020-04-14 LAB — HEPATIC FUNCTION PANEL
ALT: 16 U/L (ref 0–53)
AST: 18 U/L (ref 0–37)
Albumin: 4.7 g/dL (ref 3.5–5.2)
Alkaline Phosphatase: 39 U/L (ref 39–117)
Bilirubin, Direct: 0.1 mg/dL (ref 0.0–0.3)
Total Bilirubin: 0.5 mg/dL (ref 0.2–1.2)
Total Protein: 7.6 g/dL (ref 6.0–8.3)

## 2020-04-14 LAB — LDL CHOLESTEROL, DIRECT: Direct LDL: 93 mg/dL

## 2020-04-14 LAB — HEMOGLOBIN A1C: Hgb A1c MFr Bld: 7.7 % — ABNORMAL HIGH (ref 4.6–6.5)

## 2020-04-14 LAB — VITAMIN B12: Vitamin B-12: 432 pg/mL (ref 211–911)

## 2020-04-14 LAB — VITAMIN D 25 HYDROXY (VIT D DEFICIENCY, FRACTURES): VITD: 46.37 ng/mL (ref 30.00–100.00)

## 2020-04-21 ENCOUNTER — Encounter: Payer: Self-pay | Admitting: Internal Medicine

## 2020-04-21 ENCOUNTER — Other Ambulatory Visit: Payer: Self-pay

## 2020-04-21 ENCOUNTER — Other Ambulatory Visit: Payer: Self-pay | Admitting: Internal Medicine

## 2020-04-21 ENCOUNTER — Ambulatory Visit (INDEPENDENT_AMBULATORY_CARE_PROVIDER_SITE_OTHER): Payer: PRIVATE HEALTH INSURANCE | Admitting: Internal Medicine

## 2020-04-21 VITALS — BP 130/72 | HR 55 | Temp 98.1°F | Ht 69.0 in | Wt 211.0 lb

## 2020-04-21 DIAGNOSIS — Z0000 Encounter for general adult medical examination without abnormal findings: Secondary | ICD-10-CM

## 2020-04-21 DIAGNOSIS — F520 Hypoactive sexual desire disorder: Secondary | ICD-10-CM | POA: Diagnosis not present

## 2020-04-21 DIAGNOSIS — E119 Type 2 diabetes mellitus without complications: Secondary | ICD-10-CM | POA: Diagnosis not present

## 2020-04-21 NOTE — Progress Notes (Signed)
Subjective:    Patient ID: Scott Valentine, male    DOB: 1956-04-21, 64 y.o.   MRN: [id number]  HPI  Here for wellness and f/u;  Overall doing ok;  Pt denies Chest pain, worsening SOB, DOE, wheezing, orthopnea, PND, worsening LE edema, palpitations, dizziness or syncope.  Pt denies neurological change such as new headache, facial or extremity weakness.  Pt denies polydipsia, polyuria, or low sugar symptoms. Pt states overall good compliance with treatment and medications, good tolerability, and has been trying to follow appropriate diet.  Pt denies worsening depressive symptoms, suicidal ideation or panic. No fever, night sweats, wt loss, loss of appetite, or other constitutional symptoms.  Pt states good ability with ADL's, has low fall risk, home safety reviewed and adequate, no other significant changes in hearing or vision, and only occasionally active with exercise Has gained about 10 lbs after retired April 2021 but plans to be more active Wt Readings from Last 3 Encounters:  04/21/20 211 lb (95.7 kg)  01/26/20 211 lb (95.7 kg)  12/13/19 210 lb (95.3 kg)   Past Medical History:  Diagnosis Date  . Allergy   . Anxiety state, unspecified 10/08/2007  . Carpal tunnel syndrome   . Cervical radiculopathy    numbness in finger tips after cervical surgery   . Clotting disorder (HCC)    DVT - secondary vein left leg   . Cough 11/08/2009  . Diabetes mellitus without complication ([place])   . DVT (deep venous thrombosis) ([place]) 2020   right leg, dissolved itself  . Dysmetabolic syndrome X 8/76/8115  . GERD 05/23/2008   past hx - not current issue   . GLUCOSE INTOLERANCE 10/19/2008  . HYPERLIPIDEMIA 10/08/2007  . HYPERPLASIA PROSTATE UNS W/O UR OBST & OTH LUTS 10/08/2007  . Impaired glucose tolerance 12/30/2010  . Other and unspecified hyperlipidemia 05/01/2007  . Rectal bleeding   . 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 TOBACCO USE, QUIT 10/08/2007  . UNS ADVRS EFF UNS RX MEDICINAL&BIOLOGICAL  SBSTNC 12/17/2007  . Unspecified essential hypertension 05/01/2007   BP increases with being upset   . URI 10/29/2007   Past Surgical History:  Procedure Laterality Date  . ANAL FISSURE REPAIR  04/25/11  . COLONOSCOPY    . DIGIT NAIL REMOVAL Right 08/18/2019   Procedure: REMOVAL OF RIGHT THUMB INGROWN NAIL;  Surgeon: Leandrew Koyanagi, MD;  Location: [facility];  Service: Orthopedics;  Laterality: Right;  . finger surgury' Left 05/30/2008   3rd finger  . HEMORRHOID SURGERY  04/25/11  . POLYPECTOMY    . POSTERIOR CERVICAL LAMINECTOMY Right 10/21/2018   Procedure: Right Cervical five-six Cervical six-seven Posterior cervical foraminotomies;  Surgeon: Judith Part, MD;  Location: [facility];  Service: Neurosurgery;  Laterality: Right;    reports that he has been smoking. He has a 44.00 pack-year smoking history. He has quit using smokeless tobacco. He reports that he does not drink alcohol and does not use drugs. family history includes Coronary artery disease (age of onset: 29) in his father; Diabetes in his mother; Heart attack in his paternal grandfather; Heart disease in his brother and father; Lung cancer in his father; Mental illness in his mother; Prostate cancer in his paternal grandfather. Allergies  Allergen Reactions  . Sulfonamide Derivatives Other (See Comments)    stevens johnson  . Atorvastatin Other (See Comments)    myalgias  . Ezetimibe Other (See Comments)    myalgias   Current Outpatient Medications on File Prior  to Visit  Medication Sig Dispense Refill  . aspirin EC 81 MG tablet Take 81 mg by mouth daily.    . empagliflozin (JARDIANCE) 25 MG TABS tablet Take 25 mg by mouth daily. 90 tablet 3  . escitalopram (LEXAPRO) 20 MG tablet Take 1 tablet (20 mg total) by mouth daily. 90 tablet 3  . fenofibrate 160 MG tablet Take 1 tablet (160 mg total) by mouth daily. 90 tablet 3  . fexofenadine (ALLEGRA) 180 MG tablet Take 1 tablet (180 mg total) by mouth daily as  needed. 90 tablet 3  . folic acid (FOLVITE) 1 MG tablet Take 1 tablet (1 mg total) by mouth daily. 90 tablet 3  . glipiZIDE (GLUCOTROL XL) 10 MG 24 hr tablet Take 1 tablet by mouth once daily with breakfast 90 tablet 3  . LORazepam (ATIVAN) 0.5 MG tablet Take 1 tablet by mouth twice daily as needed for anxiety 60 tablet 5  . losartan (COZAAR) 50 MG tablet Take 1 tablet (50 mg total) by mouth daily. 90 tablet 3  . magnesium oxide (MAG-OX) 400 MG tablet Take 400 mg by mouth daily.    . metFORMIN (GLUCOPHAGE-XR) 500 MG 24 hr tablet Take 4 tablets (2,000 mg total) by mouth daily with breakfast. 360 tablet 3  . metoprolol tartrate (LOPRESSOR) 25 MG tablet TAKE 1/2 (ONE-HALF) TABLET BY MOUTH ONCE DAILY 45 tablet 3  . Multiple Vitamin (MULTIVITAMIN) tablet Take 1 tablet by mouth daily.      . rosuvastatin (CRESTOR) 40 MG tablet Take 1 tablet (40 mg total) by mouth daily. 90 tablet 3  . vitamin C (ASCORBIC ACID) 500 MG tablet Take 500 mg by mouth daily.     No current facility-administered medications on file prior to visit.   Review of Systems All otherwise neg per pt    Objective:   Physical Exam BP 130/72 (BP Location: Left Arm, Patient Position: Sitting, Cuff Size: Large)   Pulse (!) 55   Temp 98.1 F (36.7 C) (Oral)   Ht 5\' 9"  (1.753 m)   Wt 211 lb (95.7 kg)   SpO2 97%   BMI 31.16 kg/m  VS noted,  Constitutional: Pt appears in NAD HENT: Head: NCAT.  Right Ear: External ear normal.  Left Ear: External ear normal.  Eyes: . Pupils are equal, round, and reactive to light. Conjunctivae and EOM are normal Nose: without d/c or deformity Neck: Neck supple. Gross normal ROM Cardiovascular: Normal rate and regular rhythm.   Pulmonary/Chest: Effort normal and breath sounds without rales or wheezing.  Abd:  Soft, NT, ND, + BS, no organomegaly Neurological: Pt is alert. At baseline orientation, motor grossly intact Skin: Skin is warm. No rashes, other new lesions, no LE edema Psychiatric: Pt  behavior is normal without agitation  All otherwise neg per pt Lab Results  Component Value Date   WBC 7.7 01/18/2020   HGB 15.6 01/18/2020   HCT 45.2 01/18/2020   PLT 216.0 01/18/2020   GLUCOSE 156 (H) 04/14/2020   CHOL 153 04/14/2020   TRIG 203.0 (H) 04/14/2020   HDL 40.00 04/14/2020   LDLDIRECT 93.0 04/14/2020   LDLCALC 79 10/09/2018   ALT 16 04/14/2020   AST 18 04/14/2020   NA 137 04/14/2020   K 4.5 04/14/2020   CL 103 04/14/2020   CREATININE 1.11 04/14/2020   BUN 17 04/14/2020   CO2 27 04/14/2020   TSH 2.74 01/18/2020   PSA 0.85 01/18/2020   HGBA1C 7.7 (H) 04/14/2020   MICROALBUR <0.7  01/18/2020         Assessment & Plan:

## 2020-04-21 NOTE — Patient Instructions (Signed)

## 2020-04-22 ENCOUNTER — Encounter: Payer: Self-pay | Admitting: Internal Medicine

## 2020-04-22 NOTE — Assessment & Plan Note (Signed)
Also mentions no interest in sex, for testosterone with next lab

## 2020-04-22 NOTE — Assessment & Plan Note (Signed)

## 2020-04-22 NOTE — Assessment & Plan Note (Signed)
stable overall by history and exam, recent data reviewed with pt, and pt to continue medical treatment as before,  to f/u any worsening symptoms or concerns  

## 2020-07-22 ENCOUNTER — Other Ambulatory Visit: Payer: Self-pay | Admitting: Internal Medicine

## 2020-10-18 ENCOUNTER — Other Ambulatory Visit: Payer: Self-pay

## 2020-10-18 ENCOUNTER — Other Ambulatory Visit: Payer: Self-pay | Admitting: Internal Medicine

## 2020-10-18 ENCOUNTER — Other Ambulatory Visit (INDEPENDENT_AMBULATORY_CARE_PROVIDER_SITE_OTHER): Payer: PRIVATE HEALTH INSURANCE

## 2020-10-18 DIAGNOSIS — F520 Hypoactive sexual desire disorder: Secondary | ICD-10-CM

## 2020-10-18 DIAGNOSIS — E119 Type 2 diabetes mellitus without complications: Secondary | ICD-10-CM

## 2020-10-18 DIAGNOSIS — Z0000 Encounter for general adult medical examination without abnormal findings: Secondary | ICD-10-CM

## 2020-10-18 DIAGNOSIS — E538 Deficiency of other specified B group vitamins: Secondary | ICD-10-CM

## 2020-10-18 DIAGNOSIS — E559 Vitamin D deficiency, unspecified: Secondary | ICD-10-CM

## 2020-10-18 LAB — BASIC METABOLIC PANEL
BUN: 12 mg/dL (ref 6–23)
CO2: 27 mEq/L (ref 19–32)
Calcium: 9.6 mg/dL (ref 8.4–10.5)
Chloride: 102 mEq/L (ref 96–112)
Creatinine, Ser: 1.08 mg/dL (ref 0.40–1.50)
GFR: 73.38 mL/min (ref >60.00)
Glucose, Bld: 159 mg/dL — ABNORMAL HIGH (ref 70–99)
Potassium: 4.8 mEq/L (ref 3.5–5.1)
Sodium: 137 mEq/L (ref 135–145)

## 2020-10-18 LAB — HEMOGLOBIN A1C: Hgb A1c MFr Bld: 7.7 % — ABNORMAL HIGH (ref 4.6–6.5)

## 2020-10-18 LAB — LIPID PANEL
Cholesterol: 147 mg/dL (ref 0–200)
HDL: 39.1 mg/dL (ref >39.00)
LDL Cholesterol: 76 mg/dL (ref 0–99)
NonHDL: 107.94
Total CHOL/HDL Ratio: 4
Triglycerides: 159 mg/dL — ABNORMAL HIGH (ref 0.0–149.0)
VLDL: 31.8 mg/dL (ref 0.0–40.0)

## 2020-10-18 NOTE — Addendum Note (Signed)
Addended by: Raliegh Ip on: 10/18/2020 09:53 AM   Modules accepted: Orders

## 2020-10-21 LAB — TESTOSTERONE, FREE, TOTAL, SHBG
Sex Hormone Binding: 81.7 nmol/L — ABNORMAL HIGH (ref 19.3–76.4)
Testosterone, Free: 9 pg/mL (ref 6.6–18.1)
Testosterone: 636 ng/dL (ref 264–916)

## 2020-10-24 ENCOUNTER — Other Ambulatory Visit: Payer: Self-pay

## 2020-10-24 ENCOUNTER — Encounter: Payer: Self-pay | Admitting: Internal Medicine

## 2020-10-24 ENCOUNTER — Ambulatory Visit: Payer: PRIVATE HEALTH INSURANCE | Admitting: Internal Medicine

## 2020-10-24 VITALS — BP 138/76 | HR 67 | Temp 98.2°F | Ht 69.0 in | Wt 209.0 lb

## 2020-10-24 DIAGNOSIS — I10 Essential (primary) hypertension: Secondary | ICD-10-CM

## 2020-10-24 DIAGNOSIS — Z0001 Encounter for general adult medical examination with abnormal findings: Secondary | ICD-10-CM

## 2020-10-24 DIAGNOSIS — E538 Deficiency of other specified B group vitamins: Secondary | ICD-10-CM

## 2020-10-24 DIAGNOSIS — E559 Vitamin D deficiency, unspecified: Secondary | ICD-10-CM | POA: Diagnosis not present

## 2020-10-24 DIAGNOSIS — Z0000 Encounter for general adult medical examination without abnormal findings: Secondary | ICD-10-CM | POA: Diagnosis not present

## 2020-10-24 DIAGNOSIS — E1165 Type 2 diabetes mellitus with hyperglycemia: Secondary | ICD-10-CM

## 2020-10-24 DIAGNOSIS — R011 Cardiac murmur, unspecified: Secondary | ICD-10-CM

## 2020-10-24 MED ORDER — LOSARTAN POTASSIUM 100 MG PO TABS: 100.0000 mg | ORAL_TABLET | Freq: Every day | ORAL | 3 refills | Status: DC

## 2020-10-24 NOTE — Progress Notes (Signed)
Patient ID: Scott Valentine, male   DOB: 09-Feb-1956, 65 y.o.   MRN: [id number]         Chief Complaint:: wellness exam and Follow-up HTn, DM, heart murmur       HPI:  Scott Valentine is a 65 y.o. male here for wellness exam; declines flu shot, o/w up to date with immunizations and preventive referrals  Now retired for almost 1 yr.  Was ill reently with URI, covid neg x 2 at [employer], and less active, lost 2 lbs.  Wife ill as well but covid neg.  Good compliance with meds overall.  Pt denies chest pain, increased sob or doe, wheezing, orthopnea, PND, increased LE swelling, palpitations, dizziness or syncope.  Denies focal neuro s/s.   Pt denies polydipsia, polyuria,  Pt denies fever, wt loss, night sweats, loss of appetite, or other constitutional symptoms .     Wt Readings from Last 3 Encounters:  10/24/20 209 lb (94.8 kg)  04/21/20 211 lb (95.7 kg)  01/26/20 211 lb (95.7 kg)   BP Readings from Last 3 Encounters:  10/24/20 138/76  04/21/20 130/72  01/26/20 130/80   Immunization History  Administered Date(s) Administered  . H1N1 06/23/2008  . Influenza Whole 07/12/2002, 07/11/2008  . Influenza,inj,Quad PF,6+ Mos 07/07/2013, 07/10/2017, 07/13/2018, 06/30/2019  . Influenza-Unspecified 05/24/2015  . Moderna Sars-Covid-2 Vaccination 10/20/2019, 11/17/2019, 07/27/2020  . Pneumococcal Conjugate-13 04/12/2016, 04/11/2017  . Pneumococcal Polysaccharide-23 10/10/2017  . Td 11/08/1998, 06/01/2007  . Tdap 04/11/2017  . Zoster Recombinat (Shingrix) 07/13/2018, 10/04/2018  There are no preventive care reminders to display for this patient.   Past Medical History:  Diagnosis Date  . Allergy   . Anxiety state, unspecified 10/08/2007  . Carpal tunnel syndrome   . Cervical radiculopathy    numbness in finger tips after cervical surgery   . Clotting disorder (HCC)    DVT - secondary vein left leg   . Cough 11/08/2009  . Diabetes mellitus without complication ([place])   . DVT  (deep venous thrombosis) ([place]) 2020   right leg, dissolved itself  . Dysmetabolic syndrome X 04/26/2992  . GERD 05/23/2008   past hx - not current issue   . GLUCOSE INTOLERANCE 10/19/2008  . HYPERLIPIDEMIA 10/08/2007  . HYPERPLASIA PROSTATE UNS W/O UR OBST & OTH LUTS 10/08/2007  . Impaired glucose tolerance 12/30/2010  . Other and unspecified hyperlipidemia 05/01/2007  . Rectal bleeding   . 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 TOBACCO USE, QUIT 10/08/2007  . UNS ADVRS EFF UNS RX MEDICINAL&BIOLOGICAL SBSTNC 12/17/2007  . Unspecified essential hypertension 05/01/2007   BP increases with being upset   . URI 10/29/2007   Past Surgical History:  Procedure Laterality Date  . ANAL FISSURE REPAIR  04/25/11  . COLONOSCOPY    . DIGIT NAIL REMOVAL Right 08/18/2019   Procedure: REMOVAL OF RIGHT THUMB INGROWN NAIL;  Surgeon: Leandrew Koyanagi, MD;  Location: [facility];  Service: Orthopedics;  Laterality: Right;  . finger surgury' Left 05/30/2008   3rd finger  . HEMORRHOID SURGERY  04/25/11  . POLYPECTOMY    . POSTERIOR CERVICAL LAMINECTOMY Right 10/21/2018   Procedure: Right Cervical five-six Cervical six-seven Posterior cervical foraminotomies;  Surgeon: Judith Part, MD;  Location: [facility];  Service: Neurosurgery;  Laterality: Right;    reports that he has been smoking. He has a 44.00 pack-year smoking history. He has quit using smokeless tobacco. He reports that he does not drink alcohol and does not  use drugs. family history includes Coronary artery disease (age of onset: 32) in his father; Diabetes in his mother; Heart attack in his paternal grandfather; Heart disease in his brother and father; Lung cancer in his father; Mental illness in his mother; Prostate cancer in his paternal grandfather. Allergies  Allergen Reactions  . Sulfonamide Derivatives Other (See Comments)    stevens johnson  . Atorvastatin Other (See Comments)    myalgias  . Ezetimibe Other (See Comments)     myalgias   Current Outpatient Medications on File Prior to Visit  Medication Sig Dispense Refill  . aspirin EC 81 MG tablet Take 81 mg by mouth daily.    . empagliflozin (JARDIANCE) 25 MG TABS tablet Take 25 mg by mouth daily. 90 tablet 3  . escitalopram (LEXAPRO) 20 MG tablet Take 1 tablet (20 mg total) by mouth daily. 90 tablet 3  . fenofibrate 160 MG tablet Take 1 tablet (160 mg total) by mouth daily. 90 tablet 3  . fexofenadine (ALLEGRA) 180 MG tablet Take 1 tablet (180 mg total) by mouth daily as needed. 90 tablet 3  . folic acid (FOLVITE) 1 MG tablet Take 1 tablet (1 mg total) by mouth daily. 90 tablet 3  . glipiZIDE (GLUCOTROL XL) 10 MG 24 hr tablet Take 1 tablet by mouth once daily with breakfast 90 tablet 3  . LORazepam (ATIVAN) 0.5 MG tablet Take 1 tablet by mouth twice daily as needed for anxiety 60 tablet 5  . magnesium oxide (MAG-OX) 400 MG tablet Take 400 mg by mouth daily.    . metFORMIN (GLUCOPHAGE-XR) 500 MG 24 hr tablet Take 4 tablets (2,000 mg total) by mouth daily with breakfast. 360 tablet 3  . metoprolol tartrate (LOPRESSOR) 25 MG tablet TAKE 1/2 (ONE-HALF) TABLET BY MOUTH ONCE DAILY 45 tablet 3  . Multiple Vitamin (MULTIVITAMIN) tablet Take 1 tablet by mouth daily.    . rosuvastatin (CRESTOR) 40 MG tablet Take 1 tablet (40 mg total) by mouth daily. 90 tablet 3  . vitamin C (ASCORBIC ACID) 500 MG tablet Take 500 mg by mouth daily.     No current facility-administered medications on file prior to visit.        ROS:  All others reviewed and negative.  Objective        PE:  BP 138/76   Pulse 67   Temp 98.2 F (36.8 C) (Oral)   Ht 5\' 9"  (1.753 m)   Wt 209 lb (94.8 kg)   SpO2 98%   BMI 30.86 kg/m                 Constitutional: Pt appears in NAD               HENT: Head: NCAT.                Right Ear: External ear normal.                 Left Ear: External ear normal.                Eyes: . Pupils are equal, round, and reactive to light. Conjunctivae and EOM  are normal               Nose: without d/c or deformity               Neck: Neck supple. Gross normal ROM               Cardiovascular: Normal rate  and regular rhythm.  With gr 2/6 murmur rusb               Pulmonary/Chest: Effort normal and breath sounds without rales or wheezing.                Abd:  Soft, NT, ND, + BS, no organomegaly               Neurological: Pt is alert. At baseline orientation, motor grossly intact               Skin: Skin is warm. No rashes, no other new lesions, LE edema - none               Psychiatric: Pt behavior is normal without agitation   Micro: none  Cardiac tracings I have personally interpreted today:  none  Pertinent Radiological findings (summarize): none   Lab Results  Component Value Date   WBC 7.7 01/18/2020   HGB 15.6 01/18/2020   HCT 45.2 01/18/2020   PLT 216.0 01/18/2020   GLUCOSE 159 (H) 10/18/2020   CHOL 147 10/18/2020   TRIG 159.0 (H) 10/18/2020   HDL 39.10 10/18/2020   LDLDIRECT 93.0 04/14/2020   LDLCALC 76 10/18/2020   ALT 16 04/14/2020   AST 18 04/14/2020   NA 137 10/18/2020   K 4.8 10/18/2020   CL 102 10/18/2020   CREATININE 1.08 10/18/2020   BUN 12 10/18/2020   CO2 27 10/18/2020   TSH 2.74 01/18/2020   PSA 0.85 01/18/2020   HGBA1C 7.7 (H) 10/18/2020   MICROALBUR <0.7 01/18/2020   Assessment/Plan:  Scott Valentine is a 65 y.o. White or Caucasian [1] male with  has a past medical history of Allergy, Anxiety state, unspecified (10/08/2007), Carpal tunnel syndrome, Cervical radiculopathy, Clotting disorder ([place]), Cough (11/08/2009), Diabetes mellitus without complication ([place]), DVT (deep venous thrombosis) ([place]) (3335), Dysmetabolic syndrome X (4/56/2563), GERD (05/23/2008), GLUCOSE INTOLERANCE (10/19/2008), HYPERLIPIDEMIA (10/08/2007), HYPERPLASIA PROSTATE UNS W/O UR OBST & OTH LUTS (10/08/2007), Impaired glucose tolerance (12/30/2010), Other and unspecified hyperlipidemia (05/01/2007), Rectal bleeding, STEVENS-JOHNSON SYNDROME, HX  OF (10/08/2007), TOBACCO USE, QUIT (10/08/2007), UNS ADVRS EFF UNS RX MEDICINAL&BIOLOGICAL SBSTNC (12/17/2007), Unspecified essential hypertension (05/01/2007), and URI (10/29/2007).  Encounter for well adult exam with abnormal findings Age and sex appropriate education and counseling updated with regular exercise and diet Referrals for preventative services - none needed Immunizations addressed - declines flu shot Smoking counseling  - counseld to quit, not yet ready Evidence for depression or other mood disorder - none significant Most recent labs reviewed. I have personally reviewed and have noted: 1) the patient's medical and social history 2) The patient's current medications and supplements 3) The patient's height, weight, and BMI have been recorded in the chart   Diabetes With mild elevated a1c, declines increased OHA, fo rincrased activity instead  Essential hypertension Uncontrolled, for incresaed losartan 100 qd  Heart murmur Apparently asymptomatic, declines echo  Followup: Return in about 6 months (around 04/23/2021).  Cathlean Cower, MD 10/30/2020 10:08 PM [facility]

## 2020-10-24 NOTE — Patient Instructions (Signed)
Ok to increase the losartan to 100 mg per day  Please continue all other medications as before, and refills have been done if requested.  Please have the pharmacy call with any other refills you may need.  Please continue your efforts at being more active, low cholesterol diet, and weight control.  You are otherwise up to date with prevention measures today.  Please keep your appointments with your specialists as you may have planned  Please make an Appointment to return in 6 months, or sooner if needed, also with Lab Appointment for testing done 3-5 days before at the FIRST FLOOR Lab (so this is for TWO appointments - please see the scheduling desk as you leave)  Due to the ongoing Covid 19 pandemic, our lab now requires an appointment for any labs done at our office.  If you need labs done and do not have an appointment, please call our office ahead of time to schedule before presenting to the lab for your testing.  

## 2020-10-30 ENCOUNTER — Encounter: Payer: Self-pay | Admitting: Internal Medicine

## 2020-10-30 DIAGNOSIS — R011 Cardiac murmur, unspecified: Secondary | ICD-10-CM | POA: Insufficient documentation

## 2020-10-30 NOTE — Assessment & Plan Note (Signed)
Apparently asymptomatic, declines echo

## 2020-10-30 NOTE — Assessment & Plan Note (Signed)
With mild elevated a1c, declines increased OHA, fo rincrased activity instead

## 2020-10-30 NOTE — Addendum Note (Signed)
Addended by: Biagio Borg on: 10/30/2020 10:10 PM   Modules accepted: Orders

## 2020-10-30 NOTE — Assessment & Plan Note (Signed)
Uncontrolled, for incresaed losartan 100 qd

## 2020-10-30 NOTE — Assessment & Plan Note (Signed)
Age and sex appropriate education and counseling updated with regular exercise and diet Referrals for preventative services - none needed Immunizations addressed - declines flu shot Smoking counseling  - counseld to quit, not yet ready Evidence for depression or other mood disorder - none significant Most recent labs reviewed. I have personally reviewed and have noted: 1) the patient's medical and social history 2) The patient's current medications and supplements 3) The patient's height, weight, and BMI have been recorded in the chart

## 2021-01-09 ENCOUNTER — Other Ambulatory Visit: Payer: Self-pay | Admitting: Internal Medicine

## 2021-01-09 NOTE — Telephone Encounter (Signed)
Please refill as per office routine med refill policy (all routine meds refilled for 3 mo or monthly per pt preference up to one year from last visit, then month to month grace period for 3 mo, then further med refills will have to be denied)  

## 2021-01-22 ENCOUNTER — Other Ambulatory Visit: Payer: Self-pay | Admitting: Internal Medicine

## 2021-03-03 ENCOUNTER — Other Ambulatory Visit: Payer: Self-pay | Admitting: Internal Medicine

## 2021-03-09 LAB — HM DIABETES EYE EXAM

## 2021-03-19 ENCOUNTER — Other Ambulatory Visit: Payer: Self-pay | Admitting: Internal Medicine

## 2021-03-20 ENCOUNTER — Encounter: Payer: Self-pay | Admitting: Internal Medicine

## 2021-04-16 ENCOUNTER — Other Ambulatory Visit: Payer: No Typology Code available for payment source

## 2021-04-25 ENCOUNTER — Encounter: Payer: PRIVATE HEALTH INSURANCE | Admitting: Internal Medicine

## 2021-05-01 ENCOUNTER — Other Ambulatory Visit: Payer: Self-pay | Admitting: Internal Medicine

## 2021-05-01 NOTE — Telephone Encounter (Signed)
Please refill as per office routine med refill policy (all routine meds refilled for 3 mo or monthly per pt preference up to one year from last visit, then month to month grace period for 3 mo, then further med refills will have to be denied)  

## 2021-05-02 ENCOUNTER — Other Ambulatory Visit (INDEPENDENT_AMBULATORY_CARE_PROVIDER_SITE_OTHER): Payer: No Typology Code available for payment source

## 2021-05-02 ENCOUNTER — Other Ambulatory Visit: Payer: Self-pay | Admitting: Internal Medicine

## 2021-05-02 ENCOUNTER — Encounter: Payer: Self-pay | Admitting: Internal Medicine

## 2021-05-02 ENCOUNTER — Other Ambulatory Visit: Payer: Self-pay

## 2021-05-02 DIAGNOSIS — E1165 Type 2 diabetes mellitus with hyperglycemia: Secondary | ICD-10-CM

## 2021-05-02 DIAGNOSIS — E559 Vitamin D deficiency, unspecified: Secondary | ICD-10-CM | POA: Diagnosis not present

## 2021-05-02 DIAGNOSIS — Z125 Encounter for screening for malignant neoplasm of prostate: Secondary | ICD-10-CM | POA: Diagnosis not present

## 2021-05-02 DIAGNOSIS — E538 Deficiency of other specified B group vitamins: Secondary | ICD-10-CM

## 2021-05-02 DIAGNOSIS — Z0001 Encounter for general adult medical examination with abnormal findings: Secondary | ICD-10-CM

## 2021-05-02 LAB — BASIC METABOLIC PANEL
BUN: 17 mg/dL (ref 6–23)
CO2: 27 mEq/L (ref 19–32)
Calcium: 9.7 mg/dL (ref 8.4–10.5)
Chloride: 101 mEq/L (ref 96–112)
Creatinine, Ser: 1.12 mg/dL (ref 0.40–1.50)
GFR: 69.98 mL/min (ref >60.00)
Glucose, Bld: 152 mg/dL — ABNORMAL HIGH (ref 70–99)
Potassium: 4.6 mEq/L (ref 3.5–5.1)
Sodium: 138 mEq/L (ref 135–145)

## 2021-05-02 LAB — HEPATIC FUNCTION PANEL
ALT: 15 U/L (ref 0–53)
AST: 18 U/L (ref 0–37)
Albumin: 4.3 g/dL (ref 3.5–5.2)
Alkaline Phosphatase: 38 U/L — ABNORMAL LOW (ref 39–117)
Bilirubin, Direct: 0.1 mg/dL (ref 0.0–0.3)
Total Bilirubin: 0.5 mg/dL (ref 0.2–1.2)
Total Protein: 7 g/dL (ref 6.0–8.3)

## 2021-05-02 LAB — CBC WITH DIFFERENTIAL/PLATELET
Basophils Absolute: 0.1 10*3/uL (ref 0.0–0.1)
Basophils Relative: 1 % (ref 0.0–3.0)
Eosinophils Absolute: 0.3 10*3/uL (ref 0.0–0.7)
Eosinophils Relative: 5.2 % — ABNORMAL HIGH (ref 0.0–5.0)
HCT: 42.8 % (ref 39.0–52.0)
Hemoglobin: 14.7 g/dL (ref 13.0–17.0)
Lymphocytes Relative: 21 % (ref 12.0–46.0)
Lymphs Abs: 1.3 10*3/uL (ref 0.7–4.0)
MCHC: 34.4 g/dL (ref 30.0–36.0)
MCV: 90.1 fl (ref 78.0–100.0)
Monocytes Absolute: 0.5 10*3/uL (ref 0.1–1.0)
Monocytes Relative: 8.7 % (ref 3.0–12.0)
Neutro Abs: 3.9 10*3/uL (ref 1.4–7.7)
Neutrophils Relative %: 64.1 % (ref 43.0–77.0)
Platelets: 200 10*3/uL (ref 150.0–400.0)
RBC: 4.76 Mil/uL (ref 4.22–5.81)
RDW: 13.3 % (ref 11.5–15.5)
WBC: 6 10*3/uL (ref 4.0–10.5)

## 2021-05-02 LAB — HEMOGLOBIN A1C: Hgb A1c MFr Bld: 8.1 % — ABNORMAL HIGH (ref 4.6–6.5)

## 2021-05-02 LAB — URINALYSIS, ROUTINE W REFLEX MICROSCOPIC
Bilirubin Urine: NEGATIVE
Hgb urine dipstick: NEGATIVE
Ketones, ur: NEGATIVE
Leukocytes,Ua: NEGATIVE
Nitrite: NEGATIVE
RBC / HPF: NONE SEEN (ref 0–?)
Specific Gravity, Urine: 1.02 (ref 1.000–1.030)
Total Protein, Urine: NEGATIVE
Urine Glucose: >=1000 — AB
Urobilinogen, UA: 0.2 (ref 0.0–1.0)
pH: 6 (ref 5.0–8.0)

## 2021-05-02 LAB — LIPID PANEL
Cholesterol: 155 mg/dL (ref 0–200)
HDL: 36.5 mg/dL — ABNORMAL LOW (ref >39.00)
NonHDL: 118.18
Total CHOL/HDL Ratio: 4
Triglycerides: 247 mg/dL — ABNORMAL HIGH (ref 0.0–149.0)
VLDL: 49.4 mg/dL — ABNORMAL HIGH (ref 0.0–40.0)

## 2021-05-02 LAB — VITAMIN B12: Vitamin B-12: 347 pg/mL (ref 211–911)

## 2021-05-02 LAB — MICROALBUMIN / CREATININE URINE RATIO
Creatinine,U: 67.3 mg/dL
Microalb Creat Ratio: 1 mg/g (ref 0.0–30.0)
Microalb, Ur: <0.7 mg/dL (ref 0.0–1.9)

## 2021-05-02 LAB — TSH: TSH: 3.42 u[IU]/mL (ref 0.35–5.50)

## 2021-05-02 LAB — VITAMIN D 25 HYDROXY (VIT D DEFICIENCY, FRACTURES): VITD: 40.69 ng/mL (ref 30.00–100.00)

## 2021-05-02 LAB — LDL CHOLESTEROL, DIRECT: Direct LDL: 88 mg/dL

## 2021-05-02 LAB — PSA: PSA: 0.83 ng/mL (ref 0.10–4.00)

## 2021-05-02 MED ORDER — PIOGLITAZONE HCL 30 MG PO TABS: 30.0000 mg | ORAL_TABLET | Freq: Every day | ORAL | 3 refills | Status: DC

## 2021-05-08 ENCOUNTER — Other Ambulatory Visit: Payer: Self-pay

## 2021-05-08 ENCOUNTER — Encounter: Payer: Self-pay | Admitting: Internal Medicine

## 2021-05-08 ENCOUNTER — Ambulatory Visit (INDEPENDENT_AMBULATORY_CARE_PROVIDER_SITE_OTHER): Payer: No Typology Code available for payment source | Admitting: Internal Medicine

## 2021-05-08 VITALS — BP 122/70 | HR 55 | Temp 98.1°F | Ht 69.0 in | Wt 210.6 lb

## 2021-05-08 DIAGNOSIS — E782 Mixed hyperlipidemia: Secondary | ICD-10-CM

## 2021-05-08 DIAGNOSIS — F411 Generalized anxiety disorder: Secondary | ICD-10-CM | POA: Diagnosis not present

## 2021-05-08 DIAGNOSIS — Z23 Encounter for immunization: Secondary | ICD-10-CM

## 2021-05-08 DIAGNOSIS — I10 Essential (primary) hypertension: Secondary | ICD-10-CM

## 2021-05-08 DIAGNOSIS — E1165 Type 2 diabetes mellitus with hyperglycemia: Secondary | ICD-10-CM

## 2021-05-08 DIAGNOSIS — E559 Vitamin D deficiency, unspecified: Secondary | ICD-10-CM

## 2021-05-08 DIAGNOSIS — E538 Deficiency of other specified B group vitamins: Secondary | ICD-10-CM

## 2021-05-08 DIAGNOSIS — Z0000 Encounter for general adult medical examination without abnormal findings: Secondary | ICD-10-CM

## 2021-05-08 MED ORDER — OZEMPIC (0.25 OR 0.5 MG/DOSE) 2 MG/1.5ML ~~LOC~~ SOPN: 0.5000 mg | PEN_INJECTOR | SUBCUTANEOUS | 3 refills | Status: DC

## 2021-05-08 MED ORDER — EZETIMIBE 10 MG PO TABS
10.0000 mg | ORAL_TABLET | Freq: Every day | ORAL | 3 refills | Status: DC
Start: 2021-05-08 — End: 2022-05-06

## 2021-05-08 NOTE — Patient Instructions (Addendum)
You had the flu shot today  We have discussed the Cardiac CT Score test to measure the calcification level (if any) in your heart arteries.  This test has been ordered in our [facility], so please call [facility] CT directly, as they prefer this, at [phone] to be scheduled.  Ok to add the generic actos as you have done, and STOP the glipizide, and STOP the fenofibrate  Please take all new medication as prescribed - the ozempic low dose, and zetia 10 mg per day  If needed in the future, we should consider change the Jardiance to [place], as this is supposed to be better for sugar and helping the kidney function  Please continue all other medications as before, and refills have been done if requested.  Please have the pharmacy call with any other refills you may need.  Please continue your efforts at being more active, low cholesterol diet, and weight control.  You are otherwise up to date with prevention measures today.  Please keep your appointments with your specialists as you may have planned  Please make an Appointment to return in 6 months, or sooner if needed, also with Lab Appointment for testing done 3-5 days before at the [facility] (so this is for TWO appointments - please see the scheduling desk as you leave)  Due to the ongoing Covid 19 pandemic, our lab now requires an appointment for any labs done at our office.  If you need labs done and do not have an appointment, please call our office ahead of time to schedule before presenting to the lab for your testing.

## 2021-05-08 NOTE — Progress Notes (Unsigned)
Left message for patient to call me back. 

## 2021-05-08 NOTE — Progress Notes (Signed)
Patient ID: Scott Valentine, male   DOB: 1955-11-25, 65 y.o.   MRN: [id number]        Chief Complaint: follow up HTN, HLD and hyperglycemia        HPI:  Scott Valentine is a 65 y.o. male here with wife, overall doing well, Pt denies chest pain, increased sob or doe, wheezing, orthopnea, PND, increased LE swelling, palpitations, dizziness or syncope.   Pt denies polydipsia, polyuria, or new focal neuro s/s.   Pt denies fever, wt loss, night sweats, loss of appetite, or other constitutional symptoms  No other new complaints  Denies worsening depressive symptoms, suicidal ideation, or panic;       Wt Readings from Last 3 Encounters:  05/08/21 210 lb 9.6 oz (95.5 kg)  10/24/20 209 lb (94.8 kg)  04/21/20 211 lb (95.7 kg)   BP Readings from Last 3 Encounters:  05/08/21 122/70  10/24/20 138/76  04/21/20 130/72         Past Medical History:  Diagnosis Date   Allergy    Anxiety state, unspecified 10/08/2007   Carpal tunnel syndrome    Cervical radiculopathy    numbness in finger tips after cervical surgery    Clotting disorder ([place])    DVT - secondary vein left leg    Cough 11/08/2009   Diabetes mellitus without complication ([place])    DVT (deep venous thrombosis) ([place]) 2020   right leg, dissolved itself   Dysmetabolic syndrome X [id number]   GERD 05/23/2008   past hx - not current issue    GLUCOSE INTOLERANCE 10/19/2008   HYPERLIPIDEMIA 10/08/2007   HYPERPLASIA PROSTATE UNS W/O UR OBST & OTH LUTS 10/08/2007   Impaired glucose tolerance 12/30/2010   Other and unspecified hyperlipidemia 05/01/2007   Rectal bleeding    STEVENS-JOHNSON SYNDROME, HX OF 10/08/2007   TOBACCO USE, QUIT 10/08/2007   UNS ADVRS EFF UNS RX MEDICINAL&BIOLOGICAL SBSTNC 12/17/2007   Unspecified essential hypertension 05/01/2007   BP increases with being upset    URI 10/29/2007   Past Surgical History:  Procedure Laterality Date   ANAL FISSURE REPAIR  04/25/11   COLONOSCOPY     DIGIT NAIL REMOVAL Right 08/18/2019    Procedure: REMOVAL OF RIGHT THUMB INGROWN NAIL;  Surgeon: Leandrew Koyanagi, MD;  Location: [facility];  Service: Orthopedics;  Laterality: Right;   finger surgury' Left 05/30/2008   3rd finger   HEMORRHOID SURGERY  04/25/11   POLYPECTOMY     POSTERIOR CERVICAL LAMINECTOMY Right 10/21/2018   Procedure: Right Cervical five-six Cervical six-seven Posterior cervical foraminotomies;  Surgeon: Judith Part, MD;  Location: [facility];  Service: Neurosurgery;  Laterality: Right;    reports that he has been smoking cigarettes. He has a 44.00 pack-year smoking history. He has quit using smokeless tobacco. He reports that he does not drink alcohol and does not use drugs. family history includes Coronary artery disease (age of onset: 40) in his father; Diabetes in his mother; Heart attack in his paternal grandfather; Heart disease in his brother and father; Lung cancer in his father; Mental illness in his mother; Prostate cancer in his paternal grandfather. Allergies  Allergen Reactions   Sulfonamide Derivatives Other (See Comments)    stevens johnson   Atorvastatin Other (See Comments)    myalgias   Ezetimibe Other (See Comments)    myalgias   Current Outpatient Medications on File Prior to Visit  Medication Sig Dispense Refill   aspirin EC 81 MG tablet Take 81  mg by mouth daily.     escitalopram (LEXAPRO) 20 MG tablet Take 1 tablet by mouth once daily 90 tablet 2   fexofenadine (ALLEGRA) 180 MG tablet Take 1 tablet (180 mg total) by mouth daily as needed. 90 tablet 3   folic acid (FOLVITE) 1 MG tablet Take 1 tablet (1 mg total) by mouth daily. 90 tablet 3   JARDIANCE 25 MG TABS tablet Take 1 tablet by mouth once daily 90 tablet 1   LORazepam (ATIVAN) 0.5 MG tablet Take 1 tablet by mouth twice daily as needed for anxiety 60 tablet 5   losartan (COZAAR) 100 MG tablet Take 1 tablet (100 mg total) by mouth daily. 90 tablet 3   magnesium oxide (MAG-OX) 400 MG tablet Take 400 mg by mouth  daily.     metFORMIN (GLUCOPHAGE-XR) 500 MG 24 hr tablet TAKE 4 TABLETS BY MOUTH ONCE DAILY WITH BREAKFAST 360 tablet 0   metoprolol tartrate (LOPRESSOR) 25 MG tablet Take 1/2 (one-half) tablet by mouth once daily 45 tablet 2   Multiple Vitamin (MULTIVITAMIN) tablet Take 1 tablet by mouth daily.     pioglitazone (ACTOS) 30 MG tablet Take 1 tablet (30 mg total) by mouth daily. 90 tablet 3   rosuvastatin (CRESTOR) 40 MG tablet Take 1 tablet by mouth once daily 90 tablet 0   vitamin C (ASCORBIC ACID) 500 MG tablet Take 500 mg by mouth daily.     No current facility-administered medications on file prior to visit.        ROS:  All others reviewed and negative.  Objective        PE:  BP 122/70 (BP Location: Right Arm, Patient Position: Sitting, Cuff Size: Large)   Pulse (!) 55   Temp 98.1 F (36.7 C) (Oral)   Ht '5\' 9"'$  (1.753 m)   Wt 210 lb 9.6 oz (95.5 kg)   SpO2 97%   BMI 31.10 kg/m                 Constitutional: Pt appears in NAD               HENT: Head: NCAT.                Right Ear: External ear normal.                 Left Ear: External ear normal.                Eyes: . Pupils are equal, round, and reactive to light. Conjunctivae and EOM are normal               Nose: without d/c or deformity               Neck: Neck supple. Gross normal ROM               Cardiovascular: Normal rate and regular rhythm.                 Pulmonary/Chest: Effort normal and breath sounds without rales or wheezing.                Abd:  Soft, NT, ND, + BS, no organomegaly               Neurological: Pt is alert. At baseline orientation, motor grossly intact               Skin: Skin is warm. No rashes, no other new lesions, LE edema -  none               Psychiatric: Pt behavior is normal without agitation   Micro: none  Cardiac tracings I have personally interpreted today:  none  Pertinent Radiological findings (summarize): none   Lab Results  Component Value Date   WBC 6.0 05/02/2021   HGB  14.7 05/02/2021   HCT 42.8 05/02/2021   PLT 200.0 05/02/2021   GLUCOSE 152 (H) 05/02/2021   CHOL 155 05/02/2021   TRIG 247.0 (H) 05/02/2021   HDL 36.50 (L) 05/02/2021   LDLDIRECT 88.0 05/02/2021   LDLCALC 76 10/18/2020   ALT 15 05/02/2021   AST 18 05/02/2021   NA 138 05/02/2021   K 4.6 05/02/2021   CL 101 05/02/2021   CREATININE 1.12 05/02/2021   BUN 17 05/02/2021   CO2 27 05/02/2021   TSH 3.42 05/02/2021   PSA 0.83 05/02/2021   HGBA1C 8.1 (H) 05/02/2021   MICROALBUR <0.7 05/02/2021   Assessment/Plan:  MELL THANE is a 65 y.o. White or Caucasian [1] male with  has a past medical history of Allergy, Anxiety state, unspecified (10/08/2007), Carpal tunnel syndrome, Cervical radiculopathy, Clotting disorder ([place]), Cough (11/08/2009), Diabetes mellitus without complication ([place]), DVT (deep venous thrombosis) ([place]) ([id number]), Dysmetabolic syndrome X ([id number]), GERD (05/23/2008), GLUCOSE INTOLERANCE (10/19/2008), HYPERLIPIDEMIA (10/08/2007), HYPERPLASIA PROSTATE UNS W/O UR OBST & OTH LUTS (10/08/2007), Impaired glucose tolerance (12/30/2010), Other and unspecified hyperlipidemia (05/01/2007), Rectal bleeding, STEVENS-JOHNSON SYNDROME, HX OF (10/08/2007), TOBACCO USE, QUIT (10/08/2007), UNS ADVRS EFF UNS RX MEDICINAL&BIOLOGICAL SBSTNC (12/17/2007), Unspecified essential hypertension (05/01/2007), and URI (10/29/2007).  HYPERLIPIDEMIA Lab Results  Component Value Date   LDLCALC 76 10/18/2020   Uncontrolled, goal ldl < 70, pt to continue current statin crestor 40, but change fenofibrate to zetia 10 qd   Essential hypertension BP Readings from Last 3 Encounters:  05/08/21 122/70  10/24/20 138/76  04/21/20 130/72   Stable, pt to continue medical treatment losartan, lopressor   Diabetes Lab Results  Component Value Date   HGBA1C 8.1 (H) 05/02/2021   Uncontrolled, add actos asd, but also start ozempic low dose, and d/c gliipizide for better controlpt to continue current medical treatment  metformin   Anxiety state Overall stable, cont currrent med tx lexapro  Followup: Return in about 6 months (around 11/06/2021).  Cathlean Cower, MD 05/13/2021 12:26 PM [facility]

## 2021-05-09 ENCOUNTER — Telehealth: Payer: Self-pay | Admitting: Internal Medicine

## 2021-05-09 NOTE — Telephone Encounter (Signed)
Patient returning nurse call  Please all back [phone]

## 2021-05-13 ENCOUNTER — Encounter: Payer: Self-pay | Admitting: Internal Medicine

## 2021-05-13 NOTE — Addendum Note (Signed)
Addended by: Biagio Borg on: 05/13/2021 12:31 PM   Modules accepted: Orders

## 2021-05-13 NOTE — Assessment & Plan Note (Signed)
Overall stable, cont currrent med tx lexapro

## 2021-05-13 NOTE — Assessment & Plan Note (Signed)
Lab Results  Component Value Date   HGBA1C 8.1 (H) 05/02/2021   Uncontrolled, add actos asd, but also start ozempic low dose, and d/c gliipizide for better controlpt to continue current medical treatment metformin

## 2021-05-13 NOTE — Assessment & Plan Note (Signed)
Lab Results  Component Value Date   LDLCALC 76 10/18/2020   Uncontrolled, goal ldl < 70, pt to continue current statin crestor 40, but change fenofibrate to zetia 10 qd

## 2021-05-13 NOTE — Assessment & Plan Note (Signed)
BP Readings from Last 3 Encounters:  05/08/21 122/70  10/24/20 138/76  04/21/20 130/72   Stable, pt to continue medical treatment losartan, lopressor

## 2021-06-27 ENCOUNTER — Other Ambulatory Visit: Payer: Self-pay | Admitting: Internal Medicine

## 2021-06-27 NOTE — Telephone Encounter (Signed)
Please refill as per office routine med refill policy (all routine meds to be refilled for 3 mo or monthly (per pt preference) up to one year from last visit, then month to month grace period for 3 mo, then further med refills will have to be denied) ? ?

## 2021-07-29 ENCOUNTER — Other Ambulatory Visit: Payer: Self-pay | Admitting: Internal Medicine

## 2021-07-29 NOTE — Telephone Encounter (Signed)
Please refill as per office routine med refill policy (all routine meds to be refilled for 3 mo or monthly (per pt preference) up to one year from last visit, then month to month grace period for 3 mo, then further med refills will have to be denied) ? ?

## 2021-08-02 ENCOUNTER — Other Ambulatory Visit: Payer: Self-pay | Admitting: Internal Medicine

## 2021-08-11 ENCOUNTER — Other Ambulatory Visit: Payer: Self-pay | Admitting: Internal Medicine

## 2021-08-12 NOTE — Telephone Encounter (Signed)
Please refill as per office routine med refill policy (all routine meds to be refilled for 3 mo or monthly (per pt preference) up to one year from last visit, then month to month grace period for 3 mo, then further med refills will have to be denied) ? ?

## 2021-08-27 ENCOUNTER — Telehealth: Payer: Self-pay | Admitting: Internal Medicine

## 2021-08-27 NOTE — Telephone Encounter (Signed)
Please refill as per office routine med refill policy (all routine meds to be refilled for 3 mo or monthly (per pt preference) up to one year from last visit, then month to month grace period for 3 mo, then further med refills will have to be denied) ? ?

## 2021-09-05 MED ORDER — TRULICITY 0.75 MG/0.5ML ~~LOC~~ SOAJ: 0.7500 mg | SUBCUTANEOUS | 3 refills | Status: DC

## 2021-09-05 NOTE — Addendum Note (Signed)
Addended by: Biagio Borg on: 09/05/2021 02:20 PM   Modules accepted: Orders

## 2021-09-05 NOTE — Telephone Encounter (Signed)
Ok to try to change to trulicity until ozempic is back - done erx, hopefully this is covered

## 2021-09-05 NOTE — Telephone Encounter (Signed)
Patient notified

## 2021-09-05 NOTE — Telephone Encounter (Signed)
Patient calling in  Med is currently on back order at several diff pharmacies & samples are currently not available  Patient wants to know what provider suggest he do (send in another rx or stop taking until order can be filled at pharmacy)  Please discuss w/ provider & fu w/ patient [phone]

## 2021-09-28 ENCOUNTER — Other Ambulatory Visit: Payer: Self-pay | Admitting: Internal Medicine

## 2021-09-28 NOTE — Telephone Encounter (Signed)
Please refill as per office routine med refill policy (all routine meds to be refilled for 3 mo or monthly (per pt preference) up to one year from last visit, then month to month grace period for 3 mo, then further med refills will have to be denied) ? ?

## 2021-10-27 ENCOUNTER — Other Ambulatory Visit: Payer: Self-pay | Admitting: Internal Medicine

## 2021-10-27 NOTE — Telephone Encounter (Signed)
Please refill as per office routine med refill policy (all routine meds to be refilled for 3 mo or monthly (per pt preference) up to one year from last visit, then month to month grace period for 3 mo, then further med refills will have to be denied) ? ?

## 2021-10-31 ENCOUNTER — Other Ambulatory Visit: Payer: Self-pay

## 2021-10-31 ENCOUNTER — Other Ambulatory Visit (INDEPENDENT_AMBULATORY_CARE_PROVIDER_SITE_OTHER): Payer: No Typology Code available for payment source

## 2021-10-31 DIAGNOSIS — E559 Vitamin D deficiency, unspecified: Secondary | ICD-10-CM | POA: Diagnosis not present

## 2021-10-31 DIAGNOSIS — E538 Deficiency of other specified B group vitamins: Secondary | ICD-10-CM | POA: Diagnosis not present

## 2021-10-31 DIAGNOSIS — E1165 Type 2 diabetes mellitus with hyperglycemia: Secondary | ICD-10-CM

## 2021-10-31 DIAGNOSIS — Z0000 Encounter for general adult medical examination without abnormal findings: Secondary | ICD-10-CM | POA: Diagnosis not present

## 2021-10-31 LAB — MICROALBUMIN / CREATININE URINE RATIO
Creatinine,U: 66.9 mg/dL
Microalb Creat Ratio: 1 mg/g (ref 0.0–30.0)
Microalb, Ur: <0.7 mg/dL (ref 0.0–1.9)

## 2021-10-31 LAB — HEPATIC FUNCTION PANEL
ALT: 30 U/L (ref 0–53)
AST: 22 U/L (ref 0–37)
Albumin: 4.4 g/dL (ref 3.5–5.2)
Alkaline Phosphatase: 43 U/L (ref 39–117)
Bilirubin, Direct: 0.1 mg/dL (ref 0.0–0.3)
Total Bilirubin: 0.5 mg/dL (ref 0.2–1.2)
Total Protein: 7 g/dL (ref 6.0–8.3)

## 2021-10-31 LAB — CBC WITH DIFFERENTIAL/PLATELET
Basophils Absolute: 0.1 10*3/uL (ref 0.0–0.1)
Basophils Relative: 0.7 % (ref 0.0–3.0)
Eosinophils Absolute: 0.2 10*3/uL (ref 0.0–0.7)
Eosinophils Relative: 2.2 % (ref 0.0–5.0)
HCT: 42.1 % (ref 39.0–52.0)
Hemoglobin: 14.1 g/dL (ref 13.0–17.0)
Lymphocytes Relative: 13.1 % (ref 12.0–46.0)
Lymphs Abs: 1.1 10*3/uL (ref 0.7–4.0)
MCHC: 33.6 g/dL (ref 30.0–36.0)
MCV: 91.5 fl (ref 78.0–100.0)
Monocytes Absolute: 0.5 10*3/uL (ref 0.1–1.0)
Monocytes Relative: 6.5 % (ref 3.0–12.0)
Neutro Abs: 6.5 10*3/uL (ref 1.4–7.7)
Neutrophils Relative %: 77.5 % — ABNORMAL HIGH (ref 43.0–77.0)
Platelets: 215 10*3/uL (ref 150.0–400.0)
RBC: 4.6 Mil/uL (ref 4.22–5.81)
RDW: 13.8 % (ref 11.5–15.5)
WBC: 8.4 10*3/uL (ref 4.0–10.5)

## 2021-10-31 LAB — URINALYSIS, ROUTINE W REFLEX MICROSCOPIC
Bilirubin Urine: NEGATIVE
Hgb urine dipstick: NEGATIVE
Ketones, ur: NEGATIVE
Leukocytes,Ua: NEGATIVE
Nitrite: NEGATIVE
Specific Gravity, Urine: 1.015 (ref 1.000–1.030)
Total Protein, Urine: NEGATIVE
Urine Glucose: >=1000 — AB
Urobilinogen, UA: 0.2 (ref 0.0–1.0)
pH: 6.5 (ref 5.0–8.0)

## 2021-10-31 LAB — BASIC METABOLIC PANEL
BUN: 15 mg/dL (ref 6–23)
CO2: 31 mEq/L (ref 19–32)
Calcium: 9.4 mg/dL (ref 8.4–10.5)
Chloride: 102 mEq/L (ref 96–112)
Creatinine, Ser: 0.88 mg/dL (ref 0.40–1.50)
GFR: 91.28 mL/min (ref >60.00)
Glucose, Bld: 146 mg/dL — ABNORMAL HIGH (ref 70–99)
Potassium: 4.7 mEq/L (ref 3.5–5.1)
Sodium: 137 mEq/L (ref 135–145)

## 2021-10-31 LAB — PSA: PSA: 0.78 ng/mL (ref 0.10–4.00)

## 2021-10-31 LAB — LIPID PANEL
Cholesterol: 99 mg/dL (ref 0–200)
HDL: 40.8 mg/dL (ref >39.00)
LDL Cholesterol: 26 mg/dL (ref 0–99)
NonHDL: 57.75
Total CHOL/HDL Ratio: 2
Triglycerides: 159 mg/dL — ABNORMAL HIGH (ref 0.0–149.0)
VLDL: 31.8 mg/dL (ref 0.0–40.0)

## 2021-10-31 LAB — TSH: TSH: 2.71 u[IU]/mL (ref 0.35–5.50)

## 2021-10-31 LAB — HEMOGLOBIN A1C: Hgb A1c MFr Bld: 7.3 % — ABNORMAL HIGH (ref 4.6–6.5)

## 2021-10-31 LAB — VITAMIN B12: Vitamin B-12: 330 pg/mL (ref 211–911)

## 2021-10-31 LAB — VITAMIN D 25 HYDROXY (VIT D DEFICIENCY, FRACTURES): VITD: 54.51 ng/mL (ref 30.00–100.00)

## 2021-11-06 ENCOUNTER — Ambulatory Visit: Payer: No Typology Code available for payment source | Admitting: Internal Medicine

## 2021-11-06 ENCOUNTER — Other Ambulatory Visit: Payer: Self-pay

## 2021-11-06 ENCOUNTER — Encounter: Payer: Self-pay | Admitting: Internal Medicine

## 2021-11-06 VITALS — BP 120/70 | HR 59 | Temp 98.8°F | Ht 69.0 in | Wt 200.8 lb

## 2021-11-06 DIAGNOSIS — N529 Male erectile dysfunction, unspecified: Secondary | ICD-10-CM

## 2021-11-06 DIAGNOSIS — E1165 Type 2 diabetes mellitus with hyperglycemia: Secondary | ICD-10-CM | POA: Diagnosis not present

## 2021-11-06 DIAGNOSIS — E782 Mixed hyperlipidemia: Secondary | ICD-10-CM

## 2021-11-06 DIAGNOSIS — I10 Essential (primary) hypertension: Secondary | ICD-10-CM | POA: Diagnosis not present

## 2021-11-06 DIAGNOSIS — Z0001 Encounter for general adult medical examination with abnormal findings: Secondary | ICD-10-CM

## 2021-11-06 DIAGNOSIS — F17200 Nicotine dependence, unspecified, uncomplicated: Secondary | ICD-10-CM

## 2021-11-06 MED ORDER — SILDENAFIL CITRATE 100 MG PO TABS: 100.0000 mg | ORAL_TABLET | Freq: Every day | ORAL | 11 refills | Status: DC | PRN

## 2021-11-06 MED ORDER — PIOGLITAZONE HCL 15 MG PO TABS: 15.0000 mg | ORAL_TABLET | Freq: Every day | ORAL | 3 refills | Status: DC

## 2021-11-06 MED ORDER — SEMAGLUTIDE (1 MG/DOSE) 4 MG/3ML ~~LOC~~ SOPN: 1.0000 mg | PEN_INJECTOR | SUBCUTANEOUS | 3 refills | Status: DC

## 2021-11-06 NOTE — Progress Notes (Signed)
Patient ID: Scott Valentine, male   DOB: Feb 17, 1956, 66 y.o.   MRN: [id number]         Chief Complaint::  Follow-up  Dm, htn, hld, new worsening ED       HPI:  Scott Valentine is a 66 y.o. male here overall doing ok, Pt denies chest pain, increased sob or doe, wheezing, orthopnea, PND, increased LE swelling, palpitations, dizziness or syncope.   Pt denies polydipsia, polyuria, or new focal neuro s/s.    Pt denies fever, wt loss, night sweats, loss of appetite, or other constitutional symptoms  Denies urinary symptoms such as dysuria, frequency, urgency, flank pain, hematuria or n/v, fever, chills, but does have several months worsening ED symptoms, asks for viagra prn.  Still trying to lose wt, tolerating ozempic low dose well   Wt Readings from Last 3 Encounters:  11/06/21 200 lb 12.8 oz (91.1 kg)  05/08/21 210 lb 9.6 oz (95.5 kg)  10/24/20 209 lb (94.8 kg)   BP Readings from Last 3 Encounters:  11/06/21 120/70  05/08/21 122/70  10/24/20 138/76   Immunization History  Administered Date(s) Administered   H1N1 06/23/2008   Influenza Whole 07/12/2002, 07/11/2008   Influenza,inj,Quad PF,6+ Mos 07/07/2013, 07/10/2017, 07/13/2018, 06/30/2019, 05/08/2021   Influenza-Unspecified 05/24/2015   Moderna Sars-Covid-2 Vaccination 10/20/2019, 11/17/2019, 07/27/2020, 01/04/2021, 08/09/2021   Pneumococcal Conjugate-13 04/12/2016, 04/11/2017   Pneumococcal Polysaccharide-23 10/10/2017   Td 11/08/1998, 06/01/2007   Tdap 04/11/2017   Zoster Recombinat (Shingrix) 07/13/2018, 10/04/2018  There are no preventive care reminders to display for this patient.    Past Medical History:  Diagnosis Date   Allergy    Anxiety state, unspecified 10/08/2007   Carpal tunnel syndrome    Cervical radiculopathy    numbness in finger tips after cervical surgery    Clotting disorder (HCC)    DVT - secondary vein left leg    Cough 11/08/2009   Diabetes mellitus without complication ([place])    DVT (deep venous  thrombosis) ([place]) 2020   right leg, dissolved itself   Dysmetabolic syndrome X 02/26/5092   GERD 05/23/2008   past hx - not current issue    GLUCOSE INTOLERANCE 10/19/2008   HYPERLIPIDEMIA 10/08/2007   HYPERPLASIA PROSTATE UNS W/O UR OBST & OTH LUTS 10/08/2007   Impaired glucose tolerance 12/30/2010   Other and unspecified hyperlipidemia 05/01/2007   Rectal bleeding    STEVENS-JOHNSON SYNDROME, HX OF 10/08/2007   TOBACCO USE, QUIT 10/08/2007   UNS ADVRS EFF UNS RX MEDICINAL&BIOLOGICAL SBSTNC 12/17/2007   Unspecified essential hypertension 05/01/2007   BP increases with being upset    URI 10/29/2007   Past Surgical History:  Procedure Laterality Date   ANAL FISSURE REPAIR  04/25/11   COLONOSCOPY     DIGIT NAIL REMOVAL Right 08/18/2019   Procedure: REMOVAL OF RIGHT THUMB INGROWN NAIL;  Surgeon: Leandrew Koyanagi, MD;  Location: [facility];  Service: Orthopedics;  Laterality: Right;   finger surgury' Left 05/30/2008   3rd finger   HEMORRHOID SURGERY  04/25/11   POLYPECTOMY     POSTERIOR CERVICAL LAMINECTOMY Right 10/21/2018   Procedure: Right Cervical five-six Cervical six-seven Posterior cervical foraminotomies;  Surgeon: Judith Part, MD;  Location: [facility];  Service: Neurosurgery;  Laterality: Right;    reports that he has been smoking cigarettes. He has a 44.00 pack-year smoking history. He has quit using smokeless tobacco. He reports that he does not drink alcohol and does not use drugs. family history includes Coronary artery  disease (age of onset: 59) in his father; Diabetes in his mother; Heart attack in his paternal grandfather; Heart disease in his brother and father; Lung cancer in his father; Mental illness in his mother; Prostate cancer in his paternal grandfather. Allergies  Allergen Reactions   Sulfonamide Derivatives Other (See Comments)    stevens johnson   Atorvastatin Other (See Comments)    myalgias   Ezetimibe Other (See Comments)    myalgias   Current  Outpatient Medications on File Prior to Visit  Medication Sig Dispense Refill   aspirin EC 81 MG tablet Take 81 mg by mouth daily.     escitalopram (LEXAPRO) 20 MG tablet Take 1 tablet by mouth once daily 90 tablet 2   ezetimibe (ZETIA) 10 MG tablet Take 1 tablet (10 mg total) by mouth daily. 90 tablet 3   fexofenadine (ALLEGRA) 180 MG tablet Take 1 tablet (180 mg total) by mouth daily as needed. 90 tablet 3   folic acid (FOLVITE) 1 MG tablet Take 1 tablet (1 mg total) by mouth daily. 90 tablet 3   JARDIANCE 25 MG TABS tablet Take 1 tablet by mouth once daily 90 tablet 0   losartan (COZAAR) 100 MG tablet Take 1 tablet (100 mg total) by mouth daily. 90 tablet 3   magnesium oxide (MAG-OX) 400 MG tablet Take 400 mg by mouth daily.     metFORMIN (GLUCOPHAGE-XR) 500 MG 24 hr tablet TAKE 4 TABLETS BY MOUTH ONCE DAILY WITH BREAKFAST 360 tablet 0   metoprolol tartrate (LOPRESSOR) 25 MG tablet Take 1/2 (one-half) tablet by mouth once daily 45 tablet 2   Multiple Vitamin (MULTIVITAMIN) tablet Take 1 tablet by mouth daily.     rosuvastatin (CRESTOR) 40 MG tablet Take 1 tablet by mouth once daily 90 tablet 0   vitamin C (ASCORBIC ACID) 500 MG tablet Take 500 mg by mouth daily.     No current facility-administered medications on file prior to visit.        ROS:  All others reviewed and negative.  Objective        PE:  BP 120/70 (BP Location: Right Arm, Patient Position: Sitting, Cuff Size: Large)    Pulse (!) 59    Temp 98.8 F (37.1 C) (Oral)    Ht 5\' 9"  (1.753 m)    Wt 200 lb 12.8 oz (91.1 kg)    SpO2 98%    BMI 29.65 kg/m                 Constitutional: Pt appears in NAD               HENT: Head: NCAT.                Right Ear: External ear normal.                 Left Ear: External ear normal.                Eyes: . Pupils are equal, round, and reactive to light. Conjunctivae and EOM are normal               Nose: without d/c or deformity               Neck: Neck supple. Gross normal ROM                Cardiovascular: Normal rate and regular rhythm.  Pulmonary/Chest: Effort normal and breath sounds without rales or wheezing.                Abd:  Soft, NT, ND, + BS, no organomegaly               Neurological: Pt is alert. At baseline orientation, motor grossly intact               Skin: Skin is warm. No rashes, no other new lesions, LE edema - none               Psychiatric: Pt behavior is normal without agitation   Micro: none  Cardiac tracings I have personally interpreted today:  none  Pertinent Radiological findings (summarize): none   Lab Results  Component Value Date   WBC 8.4 10/31/2021   HGB 14.1 10/31/2021   HCT 42.1 10/31/2021   PLT 215.0 10/31/2021   GLUCOSE 146 (H) 10/31/2021   CHOL 99 10/31/2021   TRIG 159.0 (H) 10/31/2021   HDL 40.80 10/31/2021   LDLDIRECT 88.0 05/02/2021   LDLCALC 26 10/31/2021   ALT 30 10/31/2021   AST 22 10/31/2021   NA 137 10/31/2021   K 4.7 10/31/2021   CL 102 10/31/2021   CREATININE 0.88 10/31/2021   BUN 15 10/31/2021   CO2 31 10/31/2021   TSH 2.71 10/31/2021   PSA 0.78 10/31/2021   HGBA1C 7.3 (H) 10/31/2021   MICROALBUR <0.7 10/31/2021   Assessment/Plan:  Scott Valentine is a 66 y.o. White or Caucasian [1] male with  has a past medical history of Allergy, Anxiety state, unspecified (10/08/2007), Carpal tunnel syndrome, Cervical radiculopathy, Clotting disorder ([place]), Cough (11/08/2009), Diabetes mellitus without complication ([place]), DVT (deep venous thrombosis) ([place]) (3299), Dysmetabolic syndrome X (2/42/6834), GERD (05/23/2008), GLUCOSE INTOLERANCE (10/19/2008), HYPERLIPIDEMIA (10/08/2007), HYPERPLASIA PROSTATE UNS W/O UR OBST & OTH LUTS (10/08/2007), Impaired glucose tolerance (12/30/2010), Other and unspecified hyperlipidemia (05/01/2007), Rectal bleeding, STEVENS-JOHNSON SYNDROME, HX OF (10/08/2007), TOBACCO USE, QUIT (10/08/2007), UNS ADVRS EFF UNS RX MEDICINAL&BIOLOGICAL SBSTNC (12/17/2007), Unspecified essential  hypertension (05/01/2007), and URI (10/29/2007).  Diabetes Lab Results  Component Value Date   HGBA1C 7.3 (H) 10/31/2021   Mild uncontrolled with obesity, pt to continue current medical treatment jardiance metformin, except to increase the ozempic to 1 mg weekly and decrease actos to 15 mg with eye to do same next visit as well   Essential hypertension BP Readings from Last 3 Encounters:  11/06/21 120/70  05/08/21 122/70  10/24/20 138/76   Stable, pt to continue medical treatment losartan, lopressor   HYPERLIPIDEMIA Lab Results  Component Value Date   LDLCALC 26 10/31/2021   Stable, pt to continue current statin crestor   Smoker Pt counsled to quit, pt not ready  Erectile dysfunction New worsening, for viagra prn  Followup: Return in about 6 months (around 05/06/2022).  Cathlean Cower, MD 11/08/2021 4:29 AM [facility]

## 2021-11-06 NOTE — Patient Instructions (Signed)
Ok to change the ozempic to the 1 mg per week dose  Ok to change the actos (piogliztazone) to 15 mg per day  Please take all new medication as prescribed - the viagra  Please continue all other medications as before, and refills have been done if requested.  Please have the pharmacy call with any other refills you may need.  Please continue your efforts at being more active, low cholesterol diet, and weight control.  You are otherwise up to date with prevention measures today.  Please keep your appointments with your specialists as you may have planned  Please make an Appointment to return in 6 months, or sooner if needed, also with Lab Appointment for testing done 3-5 days before at the [facility] (so this is for TWO appointments - please see the scheduling desk as you leave)  Due to the ongoing Covid 19 pandemic, our lab now requires an appointment for any labs done at our office.  If you need labs done and do not have an appointment, please call our office ahead of time to schedule before presenting to the lab for your testing.

## 2021-11-06 NOTE — Progress Notes (Signed)
Patient ID: Scott Valentine, male   DOB: 12-Dec-1955, 66 y.o.   MRN: [id number]

## 2021-11-07 ENCOUNTER — Other Ambulatory Visit: Payer: Self-pay | Admitting: Internal Medicine

## 2021-11-08 ENCOUNTER — Encounter: Payer: Self-pay | Admitting: Internal Medicine

## 2021-11-08 DIAGNOSIS — N529 Male erectile dysfunction, unspecified: Secondary | ICD-10-CM | POA: Insufficient documentation

## 2021-11-08 NOTE — Assessment & Plan Note (Signed)
Pt counsled to quit, pt not ready °

## 2021-11-08 NOTE — Assessment & Plan Note (Signed)
New worsening, for viagra prn ?

## 2021-11-08 NOTE — Assessment & Plan Note (Signed)
Lab Results  ?Component Value Date  ? [place] 26 10/31/2021  ? ?Stable, pt to continue current statin crestor ? ?

## 2021-11-08 NOTE — Assessment & Plan Note (Signed)
BP Readings from Last 3 Encounters:  ?11/06/21 120/70  ?05/08/21 122/70  ?10/24/20 138/76  ? ?Stable, pt to continue medical treatment losartan, lopressor ? ?

## 2021-11-08 NOTE — Assessment & Plan Note (Signed)
Lab Results  ?Component Value Date  ? HGBA1C 7.3 (H) 10/31/2021  ? ?Mild uncontrolled with obesity, pt to continue current medical treatment jardiance metformin, except to increase the ozempic to 1 mg weekly and decrease actos to 15 mg with eye to do same next visit as well ? ?

## 2021-11-12 ENCOUNTER — Other Ambulatory Visit: Payer: Self-pay | Admitting: Internal Medicine

## 2021-11-12 NOTE — Telephone Encounter (Signed)
Please refill as per office routine med refill policy (all routine meds to be refilled for 3 mo or monthly (per pt preference) up to one year from last visit, then month to month grace period for 3 mo, then further med refills will have to be denied) ? ?

## 2021-12-04 ENCOUNTER — Other Ambulatory Visit: Payer: Self-pay | Admitting: Internal Medicine

## 2021-12-04 NOTE — Telephone Encounter (Signed)
Please refill as per office routine med refill policy (all routine meds to be refilled for 3 mo or monthly (per pt preference) up to one year from last visit, then month to month grace period for 3 mo, then further med refills will have to be denied) ? ?

## 2022-01-03 ENCOUNTER — Other Ambulatory Visit: Payer: Self-pay | Admitting: Internal Medicine

## 2022-01-03 NOTE — Telephone Encounter (Signed)
Please refill as per office routine med refill policy (all routine meds to be refilled for 3 mo or monthly (per pt preference) up to one year from last visit, then month to month grace period for 3 mo, then further med refills will have to be denied) ? ?

## 2022-01-30 ENCOUNTER — Other Ambulatory Visit: Payer: Self-pay | Admitting: Internal Medicine

## 2022-01-30 NOTE — Telephone Encounter (Signed)
Please refill as per office routine med refill policy (all routine meds to be refilled for 3 mo or monthly (per pt preference) up to one year from last visit, then month to month grace period for 3 mo, then further med refills will have to be denied) ? ?

## 2022-02-06 ENCOUNTER — Other Ambulatory Visit: Payer: Self-pay | Admitting: Internal Medicine

## 2022-03-04 ENCOUNTER — Other Ambulatory Visit: Payer: Self-pay | Admitting: Internal Medicine

## 2022-05-02 ENCOUNTER — Other Ambulatory Visit: Payer: Self-pay | Admitting: Internal Medicine

## 2022-05-02 NOTE — Telephone Encounter (Signed)
Please refill as per office routine med refill policy (all routine meds to be refilled for 3 mo or monthly (per pt preference) up to one year from last visit, then month to month grace period for 3 mo, then further med refills will have to be denied) ? ?

## 2022-05-10 ENCOUNTER — Other Ambulatory Visit (INDEPENDENT_AMBULATORY_CARE_PROVIDER_SITE_OTHER): Payer: No Typology Code available for payment source

## 2022-05-10 DIAGNOSIS — E1165 Type 2 diabetes mellitus with hyperglycemia: Secondary | ICD-10-CM | POA: Diagnosis not present

## 2022-05-10 LAB — HEPATIC FUNCTION PANEL
ALT: 39 U/L (ref 0–53)
AST: 27 U/L (ref 0–37)
Albumin: 4.1 g/dL (ref 3.5–5.2)
Alkaline Phosphatase: 47 U/L (ref 39–117)
Bilirubin, Direct: 0.2 mg/dL (ref 0.0–0.3)
Total Bilirubin: 0.4 mg/dL (ref 0.2–1.2)
Total Protein: 7.1 g/dL (ref 6.0–8.3)

## 2022-05-10 LAB — BASIC METABOLIC PANEL
BUN: 11 mg/dL (ref 6–23)
CO2: 29 mEq/L (ref 19–32)
Calcium: 9.4 mg/dL (ref 8.4–10.5)
Chloride: 104 mEq/L (ref 96–112)
Creatinine, Ser: 1 mg/dL (ref 0.40–1.50)
GFR: 79.61 mL/min (ref >60.00)
Glucose, Bld: 136 mg/dL — ABNORMAL HIGH (ref 70–99)
Potassium: 5.3 mEq/L — ABNORMAL HIGH (ref 3.5–5.1)
Sodium: 139 mEq/L (ref 135–145)

## 2022-05-10 LAB — LIPID PANEL
Cholesterol: 75 mg/dL (ref 0–200)
HDL: 38.9 mg/dL — ABNORMAL LOW (ref >39.00)
LDL Cholesterol: 23 mg/dL (ref 0–99)
NonHDL: 36.24
Total CHOL/HDL Ratio: 2
Triglycerides: 68 mg/dL (ref 0.0–149.0)
VLDL: 13.6 mg/dL (ref 0.0–40.0)

## 2022-05-10 LAB — HEMOGLOBIN A1C: Hgb A1c MFr Bld: 7.1 % — ABNORMAL HIGH (ref 4.6–6.5)

## 2022-05-13 ENCOUNTER — Encounter: Payer: Self-pay | Admitting: Internal Medicine

## 2022-05-15 ENCOUNTER — Ambulatory Visit (INDEPENDENT_AMBULATORY_CARE_PROVIDER_SITE_OTHER): Payer: No Typology Code available for payment source | Admitting: Internal Medicine

## 2022-05-15 VITALS — BP 122/68 | HR 67 | Temp 98.2°F | Ht 69.0 in | Wt 196.0 lb

## 2022-05-15 DIAGNOSIS — E782 Mixed hyperlipidemia: Secondary | ICD-10-CM

## 2022-05-15 DIAGNOSIS — E785 Hyperlipidemia, unspecified: Secondary | ICD-10-CM

## 2022-05-15 DIAGNOSIS — Z23 Encounter for immunization: Secondary | ICD-10-CM

## 2022-05-15 DIAGNOSIS — E119 Type 2 diabetes mellitus without complications: Secondary | ICD-10-CM

## 2022-05-15 DIAGNOSIS — E538 Deficiency of other specified B group vitamins: Secondary | ICD-10-CM

## 2022-05-15 DIAGNOSIS — E1165 Type 2 diabetes mellitus with hyperglycemia: Secondary | ICD-10-CM | POA: Diagnosis not present

## 2022-05-15 DIAGNOSIS — E559 Vitamin D deficiency, unspecified: Secondary | ICD-10-CM

## 2022-05-15 DIAGNOSIS — Z125 Encounter for screening for malignant neoplasm of prostate: Secondary | ICD-10-CM

## 2022-05-15 DIAGNOSIS — F17200 Nicotine dependence, unspecified, uncomplicated: Secondary | ICD-10-CM

## 2022-05-15 DIAGNOSIS — Z0001 Encounter for general adult medical examination with abnormal findings: Secondary | ICD-10-CM | POA: Diagnosis not present

## 2022-05-15 DIAGNOSIS — I10 Essential (primary) hypertension: Secondary | ICD-10-CM | POA: Diagnosis not present

## 2022-05-15 MED ORDER — SEMAGLUTIDE (2 MG/DOSE) 8 MG/3ML ~~LOC~~ SOPN: 2.0000 mg | PEN_INJECTOR | SUBCUTANEOUS | 3 refills | Status: DC

## 2022-05-15 NOTE — Progress Notes (Signed)
Patient ID: Scott Valentine, male   DOB: 08-12-1956, 66 y.o.   MRN: [id number]         Chief Complaint:: wellness exam and dm, hld, htn       HPI:  Scott Valentine is a 66 y.o. male here for wellness exam; declines covid booster, o/w up to date                        Also Pt denies chest pain, increased sob or doe, wheezing, orthopnea, PND, increased LE swelling, palpitations, dizziness or syncope.   Pt denies polydipsia, polyuria, or new focal neuro s/s.    Pt denies fever, wt loss, night sweats, loss of appetite, or other constitutional symptoms     Wt Readings from Last 3 Encounters:  05/15/22 196 lb (88.9 kg)  11/06/21 200 lb 12.8 oz (91.1 kg)  05/08/21 210 lb 9.6 oz (95.5 kg)   BP Readings from Last 3 Encounters:  05/15/22 122/68  11/06/21 120/70  05/08/21 122/70   Immunization History  Administered Date(s) Administered   H1N1 06/23/2008   Influenza Whole 07/12/2002, 07/11/2008   Influenza,inj,Quad PF,6+ Mos 07/07/2013, 07/10/2017, 07/13/2018, 06/30/2019, 05/08/2021, 05/15/2022   Influenza-Unspecified 05/24/2015   Moderna Sars-Covid-2 Vaccination 10/20/2019, 11/17/2019, 07/27/2020, 01/04/2021, 08/09/2021   Pneumococcal Conjugate-13 04/12/2016, 04/11/2017   Pneumococcal Polysaccharide-23 10/10/2017   Td 11/08/1998, 06/01/2007   Tdap 04/11/2017   Zoster Recombinat (Shingrix) 07/13/2018, 10/04/2018   There are no preventive care reminders to display for this patient.     Past Medical History:  Diagnosis Date   Allergy    Anxiety state, unspecified 10/08/2007   Carpal tunnel syndrome    Cervical radiculopathy    numbness in finger tips after cervical surgery    Clotting disorder (HCC)    DVT - secondary vein left leg    Cough 11/08/2009   Diabetes mellitus without complication ([place])    DVT (deep venous thrombosis) ([facility]) 2020   right leg, dissolved itself   Dysmetabolic syndrome X 0/93/2671   GERD 05/23/2008   past hx - not current issue    GLUCOSE INTOLERANCE  10/19/2008   HYPERLIPIDEMIA 10/08/2007   HYPERPLASIA PROSTATE UNS W/O UR OBST & OTH LUTS 10/08/2007   Impaired glucose tolerance 12/30/2010   Other and unspecified hyperlipidemia 05/01/2007   Rectal bleeding    STEVENS-JOHNSON SYNDROME, HX OF 10/08/2007   TOBACCO USE, QUIT 10/08/2007   UNS ADVRS EFF UNS RX MEDICINAL&BIOLOGICAL SBSTNC 12/17/2007   Unspecified essential hypertension 05/01/2007   BP increases with being upset    URI 10/29/2007   Past Surgical History:  Procedure Laterality Date   ANAL FISSURE REPAIR  04/25/11   COLONOSCOPY     DIGIT NAIL REMOVAL Right 08/18/2019   Procedure: REMOVAL OF RIGHT THUMB INGROWN NAIL;  Surgeon: Leandrew Koyanagi, MD;  Location: [facility];  Service: Orthopedics;  Laterality: Right;   finger surgury' Left 05/30/2008   3rd finger   HEMORRHOID SURGERY  04/25/11   POLYPECTOMY     POSTERIOR CERVICAL LAMINECTOMY Right 10/21/2018   Procedure: Right Cervical five-six Cervical six-seven Posterior cervical foraminotomies;  Surgeon: Judith Part, MD;  Location: [facility];  Service: Neurosurgery;  Laterality: Right;    reports that he has been smoking cigarettes. He has a 44.00 pack-year smoking history. He has quit using smokeless tobacco. He reports that he does not drink alcohol and does not use drugs. family history includes Coronary artery disease (age of onset: 60) in  his father; Diabetes in his mother; Heart attack in his paternal grandfather; Heart disease in his brother and father; Lung cancer in his father; Mental illness in his mother; Prostate cancer in his paternal grandfather. Allergies  Allergen Reactions   Sulfonamide Derivatives Other (See Comments)    stevens johnson   Atorvastatin Other (See Comments)    myalgias   Ezetimibe Other (See Comments)    myalgias   Current Outpatient Medications on File Prior to Visit  Medication Sig Dispense Refill   aspirin EC 81 MG tablet Take 81 mg by mouth daily.     escitalopram (LEXAPRO) 20 MG  tablet Take 1 tablet by mouth once daily 90 tablet 0   fexofenadine (ALLEGRA) 180 MG tablet Take 1 tablet (180 mg total) by mouth daily as needed. 90 tablet 3   folic acid (FOLVITE) 1 MG tablet Take 1 tablet (1 mg total) by mouth daily. 90 tablet 3   JARDIANCE 25 MG TABS tablet Take 1 tablet by mouth once daily 90 tablet 0   LORazepam (ATIVAN) 0.5 MG tablet Take 1 tablet by mouth twice daily as needed for anxiety 60 tablet 2   losartan (COZAAR) 100 MG tablet Take 1 tablet by mouth once daily 90 tablet 3   magnesium oxide (MAG-OX) 400 MG tablet Take 400 mg by mouth daily.     metFORMIN (GLUCOPHAGE-XR) 500 MG 24 hr tablet TAKE 4 TABLETS BY MOUTH ONCE DAILY WITH BREAKFAST 360 tablet 2   metoprolol tartrate (LOPRESSOR) 25 MG tablet Take 1/2 (one-half) tablet by mouth once daily 45 tablet 0   Multiple Vitamin (MULTIVITAMIN) tablet Take 1 tablet by mouth daily.     pioglitazone (ACTOS) 15 MG tablet Take 1 tablet (15 mg total) by mouth daily. 90 tablet 3   rosuvastatin (CRESTOR) 40 MG tablet Take 1 tablet by mouth once daily 90 tablet 2   sildenafil (VIAGRA) 100 MG tablet Take 1 tablet (100 mg total) by mouth daily as needed for erectile dysfunction. 10 tablet 11   vitamin C (ASCORBIC ACID) 500 MG tablet Take 500 mg by mouth daily.     No current facility-administered medications on file prior to visit.        ROS:  All others reviewed and negative.  Objective        PE:  BP 122/68 (BP Location: Right Arm, Patient Position: Sitting, Cuff Size: Large)   Pulse 67   Temp 98.2 F (36.8 C) (Oral)   Ht '5\' 9"'$  (1.753 m)   Wt 196 lb (88.9 kg)   SpO2 95%   BMI 28.94 kg/m                 Constitutional: Pt appears in NAD               HENT: Head: NCAT.                Right Ear: External ear normal.                 Left Ear: External ear normal.                Eyes: . Pupils are equal, round, and reactive to light. Conjunctivae and EOM are normal               Nose: without d/c or deformity                Neck: Neck supple. Gross normal ROM  Cardiovascular: Normal rate and regular rhythm.                 Pulmonary/Chest: Effort normal and breath sounds without rales or wheezing.                Abd:  Soft, NT, ND, + BS, no organomegaly               Neurological: Pt is alert. At baseline orientation, motor grossly intact               Skin: Skin is warm. No rashes, no other new lesions, LE edema - none               Psychiatric: Pt behavior is normal without agitation   Micro: none  Cardiac tracings I have personally interpreted today:  none  Pertinent Radiological findings (summarize): none   Lab Results  Component Value Date   WBC 8.4 10/31/2021   HGB 14.1 10/31/2021   HCT 42.1 10/31/2021   PLT 215.0 10/31/2021   GLUCOSE 136 (H) 05/10/2022   CHOL 75 05/10/2022   TRIG 68.0 05/10/2022   HDL 38.90 (L) 05/10/2022   LDLDIRECT 88.0 05/02/2021   LDLCALC 23 05/10/2022   ALT 39 05/10/2022   AST 27 05/10/2022   NA 139 05/10/2022   K 5.3 No hemolysis seen (H) 05/10/2022   CL 104 05/10/2022   CREATININE 1.00 05/10/2022   BUN 11 05/10/2022   CO2 29 05/10/2022   TSH 2.71 10/31/2021   PSA 0.78 10/31/2021   HGBA1C 7.1 (H) 05/10/2022   MICROALBUR <0.7 10/31/2021   Assessment/Plan:  Scott Valentine is a 66 y.o. White or Caucasian [1] male with  has a past medical history of Allergy, Anxiety state, unspecified (10/08/2007), Carpal tunnel syndrome, Cervical radiculopathy, Clotting disorder ([place]), Cough (11/08/2009), Diabetes mellitus without complication ([place]), DVT (deep venous thrombosis) ([place]) (4268), Dysmetabolic syndrome X (3/41/9622), GERD (05/23/2008), GLUCOSE INTOLERANCE (10/19/2008), HYPERLIPIDEMIA (10/08/2007), HYPERPLASIA PROSTATE UNS W/O UR OBST & OTH LUTS (10/08/2007), Impaired glucose tolerance (12/30/2010), Other and unspecified hyperlipidemia (05/01/2007), Rectal bleeding, STEVENS-JOHNSON SYNDROME, HX OF (10/08/2007), TOBACCO USE, QUIT (10/08/2007), UNS ADVRS EFF UNS RX  MEDICINAL&BIOLOGICAL SBSTNC (12/17/2007), Unspecified essential hypertension (05/01/2007), and URI (10/29/2007).  Smoker Pt counsled to quit, pt not ready; for pulm referral for LDCT screenings  Encounter for well adult exam with abnormal findings Age and sex appropriate education and counseling updated with regular exercise and diet Referrals for preventative services - none needed Immunizations addressed - declines covid booster Smoking counseling  - Pt counsled to quit, pt not ready Evidence for depression or other mood disorder - none significant Most recent labs reviewed. I have personally reviewed and have noted: 1) the patient's medical and social history 2) The patient's current medications and supplements 3) The patient's height, weight, and BMI have been recorded in the chart   Diabetes Lab Results  Component Value Date   HGBA1C 7.1 (H) 05/10/2022   Uncontrolled, pt to continue current medical treatment jardiance 25 qd, metformin ER 500 - 4 qam, actos 15 qd, and increase the ozempic to 2 mg weekly    Essential hypertension BP Readings from Last 3 Encounters:  05/15/22 122/68  11/06/21 120/70  05/08/21 122/70   Stable, pt to continue medical treatment losartan 100 qd, lopressor 12.5 qam    HYPERLIPIDEMIA Lab Results  Component Value Date   LDLCALC 23 05/10/2022   Stable, pt to continue current statin crestor 40 qd but d/c zetia 10 qd, declines  cardiac CT score  Followup: Return in about 6 months (around 11/13/2022).  Cathlean Cower, MD 05/18/2022 6:33 PM [facility]

## 2022-05-15 NOTE — Patient Instructions (Addendum)
You had the flu shot today  Ok to increase the ozempic to 2 mg per week  Ok to stop the zetia  Please continue all other medications as before, and refills have been done if requested.  Please have the pharmacy call with any other refills you may need.  Please continue your efforts at being more active, low cholesterol diet, and weight control.  You are otherwise up to date with prevention measures today.  Please keep your appointments with your specialists as you may have planned  You will be contacted regarding the referral for: Pulmonary for the "LDCT" program  Please call if you change your mind about the Cardiac CT score testing  Please make an Appointment to return in 6 months, or sooner if needed, also with Lab Appointment for testing done 3-5 days before at the [facility] (so this is for TWO appointments - please see the scheduling desk as you leave)

## 2022-05-18 ENCOUNTER — Encounter: Payer: Self-pay | Admitting: Internal Medicine

## 2022-05-18 ENCOUNTER — Other Ambulatory Visit: Payer: Self-pay | Admitting: Internal Medicine

## 2022-05-18 NOTE — Assessment & Plan Note (Signed)
BP Readings from Last 3 Encounters:  05/15/22 122/68  11/06/21 120/70  05/08/21 122/70   Stable, pt to continue medical treatment losartan 100 qd, lopressor 12.5 qam

## 2022-05-18 NOTE — Assessment & Plan Note (Signed)
Lab Results  Component Value Date   HGBA1C 7.1 (H) 05/10/2022   Uncontrolled, pt to continue current medical treatment jardiance 25 qd, metformin ER 500 - 4 qam, actos 15 qd, and increase the ozempic to 2 mg weekly

## 2022-05-18 NOTE — Assessment & Plan Note (Addendum)
Pt counsled to quit, pt not ready; for pulm referral for LDCT screenings

## 2022-05-18 NOTE — Assessment & Plan Note (Addendum)
Lab Results  Component Value Date   LDLCALC 23 05/10/2022   Stable, pt to continue current statin crestor 40 qd but d/c zetia 10 qd, declines cardiac CT score

## 2022-05-18 NOTE — Assessment & Plan Note (Signed)
Age and sex appropriate education and counseling updated with regular exercise and diet Referrals for preventative services - none needed Immunizations addressed - declines covid booster Smoking counseling  - Pt counsled to quit, pt not ready Evidence for depression or other mood disorder - none significant Most recent labs reviewed. I have personally reviewed and have noted: 1) the patient's medical and social history 2) The patient's current medications and supplements 3) The patient's height, weight, and BMI have been recorded in the chart

## 2022-05-24 ENCOUNTER — Telehealth: Payer: Self-pay | Admitting: Internal Medicine

## 2022-05-24 MED ORDER — LORAZEPAM 1 MG PO TABS: ORAL_TABLET | ORAL | 2 refills | Status: DC

## 2022-05-24 NOTE — Telephone Encounter (Signed)
Patient called concerning his ativan 0.5 mg. - pharmacy is having trouble getting the 0.5 mg.  Patient wants to know if you can call in an rx for 1.0 and let him split them into.  He only has enough left for 2 days.  Please send script into [facility] in [place].

## 2022-05-24 NOTE — Telephone Encounter (Signed)
Ok this is done erx 

## 2022-05-31 ENCOUNTER — Other Ambulatory Visit: Payer: Self-pay | Admitting: Internal Medicine

## 2022-06-04 ENCOUNTER — Other Ambulatory Visit: Payer: Self-pay | Admitting: Internal Medicine

## 2022-06-04 NOTE — Telephone Encounter (Signed)
Please refill as per office routine med refill policy (all routine meds to be refilled for 3 mo or monthly (per pt preference) up to one year from last visit, then month to month grace period for 3 mo, then further med refills will have to be denied) ? ?

## 2022-06-25 ENCOUNTER — Telehealth: Payer: Self-pay

## 2022-06-25 NOTE — Telephone Encounter (Signed)
MEDICATION: LORazepam (ATIVAN) 1 MG tablet  PHARMACY: [facility] :  Address: [address], [place], [place] [address]  Comments: Scott Valentine is completely out. Has two pills left.   **Let patient know to contact pharmacy at the end of the day to make sure medication is ready. **  ** Please notify patient to allow 48-72 hours to process**  **Encourage patient to contact the pharmacy for refills or they can request refills through [facility]**

## 2022-06-27 NOTE — Telephone Encounter (Signed)
No new rx is needed since there are 2 refills

## 2022-06-27 NOTE — Telephone Encounter (Signed)
PT calls back regarding this RX request. PT states that the [facility] had not received the LORazepam (ATIVAN) 1 MG tablet RX and PT is currently out.

## 2022-06-28 MED ORDER — LORAZEPAM 1 MG PO TABS: ORAL_TABLET | ORAL | 2 refills | Status: DC

## 2022-06-28 NOTE — Telephone Encounter (Signed)
Ok this is done 

## 2022-06-28 NOTE — Telephone Encounter (Signed)
Spoke with patient who states that he was not able to get refill due to [facility] being out of the medication. He is requesting the Ativan gets sent to the [facility] and home care in [place] [address].

## 2022-06-28 NOTE — Addendum Note (Signed)
Addended by: Biagio Borg on: 06/28/2022 08:22 AM   Modules accepted: Orders

## 2022-08-09 ENCOUNTER — Telehealth: Payer: Self-pay

## 2022-08-09 NOTE — Telephone Encounter (Signed)
PA started   Key: [id number]

## 2022-08-18 ENCOUNTER — Other Ambulatory Visit: Payer: Self-pay | Admitting: Internal Medicine

## 2022-08-19 NOTE — Telephone Encounter (Signed)
Please refill as per office routine med refill policy (all routine meds to be refilled for 3 mo or monthly (per pt preference) up to one year from last visit, then month to month grace period for 3 mo, then further med refills will have to be denied) ? ?

## 2022-08-21 NOTE — Telephone Encounter (Signed)
Returned patient call regarding PA for Ozempic and to clarify dosage as there was several keys in covermymeds.  Waiting for [employer] to return a determination.

## 2022-08-21 NOTE — Telephone Encounter (Signed)
Patient called back and wants to know when this PA will be finished - he will need his medication for his injection on Friday.  Please advise.

## 2022-08-26 ENCOUNTER — Telehealth: Payer: Self-pay | Admitting: *Deleted

## 2022-08-26 NOTE — Telephone Encounter (Addendum)
Based on via covermymeds PA outcome is   been cancelled/closed by health plan/payer/processor/PBM. Called pt and left detail message in regards

## 2022-08-26 NOTE — Telephone Encounter (Signed)
Pt need PA on Ozempic. Submitted w/  (Key: [id number]) Your information has been sent to [employer].,,/lmb

## 2022-08-26 NOTE — Telephone Encounter (Signed)
Patient called again today and wants to know if you have heard anything yet

## 2022-08-26 NOTE — Telephone Encounter (Signed)
Rec'd msg back stating PA Case: [id number], Status: Approved, Coverage Starts on: 08/26/2022 12:00:00 AM, Coverage Ends on: 08/26/2023 12:00:00 AM. Faxing approval to pof.Marland KitchenJohny Chess

## 2022-08-26 NOTE — Telephone Encounter (Signed)
Left message for a call back regarding PA

## 2022-08-26 NOTE — Telephone Encounter (Signed)
Left detail message for pt about PA for ozempic being cancelled and closed by insurance via covemymeds

## 2022-08-29 ENCOUNTER — Other Ambulatory Visit: Payer: Self-pay | Admitting: Internal Medicine

## 2022-08-29 NOTE — Telephone Encounter (Signed)
Please refill as per office routine med refill policy (all routine meds to be refilled for 3 mo or monthly (per pt preference) up to one year from last visit, then month to month grace period for 3 mo, then further med refills will have to be denied) ? ?

## 2022-09-04 ENCOUNTER — Telehealth: Payer: Self-pay | Admitting: Internal Medicine

## 2022-09-04 NOTE — Telephone Encounter (Signed)
Made pt aware that medication can not be send in without an office visit to be evaluate for the flu. Pt said ok and did not voice to be schedule for it

## 2022-09-04 NOTE — Telephone Encounter (Signed)
Patient called and said that his wife was diagnosed with the flu and now he is having the same symptoms - cough, congestion, fever.  He would like to know if some tamiflu could be called in for him.   Pharmacy:  [facility] in [place]  Patients phone number:  [phone]

## 2022-09-26 ENCOUNTER — Other Ambulatory Visit: Payer: Self-pay | Admitting: Internal Medicine

## 2022-10-14 ENCOUNTER — Other Ambulatory Visit: Payer: Self-pay | Admitting: Internal Medicine

## 2022-10-14 NOTE — Telephone Encounter (Signed)
Please refill as per office routine med refill policy (all routine meds to be refilled for 3 mo or monthly (per pt preference) up to one year from last visit, then month to month grace period for 3 mo, then further med refills will have to be denied) ? ?

## 2022-11-01 ENCOUNTER — Other Ambulatory Visit: Payer: Self-pay | Admitting: Internal Medicine

## 2022-11-01 NOTE — Telephone Encounter (Signed)
Please refill as per office routine med refill policy (all routine meds to be refilled for 3 mo or monthly (per pt preference) up to one year from last visit, then month to month grace period for 3 mo, then further med refills will have to be denied) ? ?

## 2022-11-05 ENCOUNTER — Other Ambulatory Visit: Payer: Self-pay | Admitting: Internal Medicine

## 2022-11-07 ENCOUNTER — Other Ambulatory Visit: Payer: Self-pay | Admitting: Internal Medicine

## 2022-11-08 ENCOUNTER — Telehealth: Payer: Self-pay | Admitting: Internal Medicine

## 2022-11-08 ENCOUNTER — Other Ambulatory Visit (INDEPENDENT_AMBULATORY_CARE_PROVIDER_SITE_OTHER): Payer: No Typology Code available for payment source

## 2022-11-08 DIAGNOSIS — E538 Deficiency of other specified B group vitamins: Secondary | ICD-10-CM

## 2022-11-08 DIAGNOSIS — E1165 Type 2 diabetes mellitus with hyperglycemia: Secondary | ICD-10-CM

## 2022-11-08 DIAGNOSIS — E559 Vitamin D deficiency, unspecified: Secondary | ICD-10-CM

## 2022-11-08 DIAGNOSIS — Z125 Encounter for screening for malignant neoplasm of prostate: Secondary | ICD-10-CM

## 2022-11-08 LAB — BASIC METABOLIC PANEL
BUN: 11 mg/dL (ref 6–23)
CO2: 26 mEq/L (ref 19–32)
Calcium: 10 mg/dL (ref 8.4–10.5)
Chloride: 102 mEq/L (ref 96–112)
Creatinine, Ser: 0.9 mg/dL (ref 0.40–1.50)
GFR: 90.02 mL/min (ref >60.00)
Glucose, Bld: 114 mg/dL — ABNORMAL HIGH (ref 70–99)
Potassium: 4.4 mEq/L (ref 3.5–5.1)
Sodium: 138 mEq/L (ref 135–145)

## 2022-11-08 LAB — HEPATIC FUNCTION PANEL
ALT: 20 U/L (ref 0–53)
AST: 21 U/L (ref 0–37)
Albumin: 4.4 g/dL (ref 3.5–5.2)
Alkaline Phosphatase: 48 U/L (ref 39–117)
Bilirubin, Direct: 0.1 mg/dL (ref 0.0–0.3)
Total Bilirubin: 0.6 mg/dL (ref 0.2–1.2)
Total Protein: 7.6 g/dL (ref 6.0–8.3)

## 2022-11-08 LAB — URINALYSIS, ROUTINE W REFLEX MICROSCOPIC
Bilirubin Urine: NEGATIVE
Hgb urine dipstick: NEGATIVE
Ketones, ur: NEGATIVE
Leukocytes,Ua: NEGATIVE
Nitrite: NEGATIVE
Specific Gravity, Urine: 1.02 (ref 1.000–1.030)
Total Protein, Urine: NEGATIVE
Urine Glucose: >=1000 — AB
Urobilinogen, UA: 0.2 (ref 0.0–1.0)
pH: 6 (ref 5.0–8.0)

## 2022-11-08 LAB — CBC WITH DIFFERENTIAL/PLATELET
Basophils Absolute: 0.1 10*3/uL (ref 0.0–0.1)
Basophils Relative: 1 % (ref 0.0–3.0)
Eosinophils Absolute: 0.2 10*3/uL (ref 0.0–0.7)
Eosinophils Relative: 2.8 % (ref 0.0–5.0)
HCT: 46.1 % (ref 39.0–52.0)
Hemoglobin: 15.7 g/dL (ref 13.0–17.0)
Lymphocytes Relative: 14.5 % (ref 12.0–46.0)
Lymphs Abs: 1.1 10*3/uL (ref 0.7–4.0)
MCHC: 34 g/dL (ref 30.0–36.0)
MCV: 92.1 fl (ref 78.0–100.0)
Monocytes Absolute: 0.5 10*3/uL (ref 0.1–1.0)
Monocytes Relative: 6.2 % (ref 3.0–12.0)
Neutro Abs: 5.5 10*3/uL (ref 1.4–7.7)
Neutrophils Relative %: 75.5 % (ref 43.0–77.0)
Platelets: 201 10*3/uL (ref 150.0–400.0)
RBC: 5 Mil/uL (ref 4.22–5.81)
RDW: 13.6 % (ref 11.5–15.5)
WBC: 7.3 10*3/uL (ref 4.0–10.5)

## 2022-11-08 LAB — LIPID PANEL
Cholesterol: 108 mg/dL (ref 0–200)
HDL: 40.9 mg/dL (ref >39.00)
LDL Cholesterol: 34 mg/dL (ref 0–99)
NonHDL: 66.85
Total CHOL/HDL Ratio: 3
Triglycerides: 166 mg/dL — ABNORMAL HIGH (ref 0.0–149.0)
VLDL: 33.2 mg/dL (ref 0.0–40.0)

## 2022-11-08 LAB — PSA: PSA: 1.14 ng/mL (ref 0.10–4.00)

## 2022-11-08 LAB — TSH: TSH: 2.92 u[IU]/mL (ref 0.35–5.50)

## 2022-11-08 LAB — HEMOGLOBIN A1C: Hgb A1c MFr Bld: 6.5 % (ref 4.6–6.5)

## 2022-11-08 LAB — VITAMIN B12: Vitamin B-12: 767 pg/mL (ref 211–911)

## 2022-11-08 LAB — MICROALBUMIN / CREATININE URINE RATIO
Creatinine,U: 90.5 mg/dL
Microalb Creat Ratio: 0.8 mg/g (ref 0.0–30.0)
Microalb, Ur: 0.7 mg/dL (ref 0.0–1.9)

## 2022-11-08 LAB — VITAMIN D 25 HYDROXY (VIT D DEFICIENCY, FRACTURES): VITD: 43.55 ng/mL (ref 30.00–100.00)

## 2022-11-08 NOTE — Telephone Encounter (Signed)
Forms received,completion in progress

## 2022-11-08 NOTE — Telephone Encounter (Signed)
PT visits today with patient assistance forms for both JARDIANCE 25 MG TABS tablet and Semaglutide, 2 MG/DOSE, 8 MG/3ML SOPN (OZEMPIC) to be filled out. Forms have been placed in Dr.John's mailbox. PT would like to pick these forms during their next appointment (this upcoming Friday). PT would also like the forms faxed out once completed.  CB for PT: [phone] FAX: [phone]

## 2022-11-14 ENCOUNTER — Other Ambulatory Visit: Payer: Self-pay | Admitting: Internal Medicine

## 2022-11-14 NOTE — Telephone Encounter (Signed)
Please refill as per office routine med refill policy (all routine meds to be refilled for 3 mo or monthly (per pt preference) up to one year from last visit, then month to month grace period for 3 mo, then further med refills will have to be denied) ? ?

## 2022-11-15 ENCOUNTER — Ambulatory Visit: Payer: No Typology Code available for payment source | Admitting: Internal Medicine

## 2022-11-15 VITALS — BP 150/102 | HR 66 | Temp 98.3°F | Ht 69.0 in | Wt 200.0 lb

## 2022-11-15 DIAGNOSIS — Z0001 Encounter for general adult medical examination with abnormal findings: Secondary | ICD-10-CM

## 2022-11-15 DIAGNOSIS — E559 Vitamin D deficiency, unspecified: Secondary | ICD-10-CM | POA: Diagnosis not present

## 2022-11-15 DIAGNOSIS — F17200 Nicotine dependence, unspecified, uncomplicated: Secondary | ICD-10-CM | POA: Diagnosis not present

## 2022-11-15 DIAGNOSIS — E1165 Type 2 diabetes mellitus with hyperglycemia: Secondary | ICD-10-CM | POA: Diagnosis not present

## 2022-11-15 DIAGNOSIS — I10 Essential (primary) hypertension: Secondary | ICD-10-CM

## 2022-11-15 DIAGNOSIS — F411 Generalized anxiety disorder: Secondary | ICD-10-CM

## 2022-11-15 DIAGNOSIS — E782 Mixed hyperlipidemia: Secondary | ICD-10-CM

## 2022-11-15 MED ORDER — CLONAZEPAM 0.5 MG PO TABS: 0.5000 mg | ORAL_TABLET | Freq: Two times a day (BID) | ORAL | 2 refills | Status: DC | PRN

## 2022-11-15 NOTE — Telephone Encounter (Signed)
Received notification from [facility]  regarding STATUS  for [place]. [employer] stating  could not process application at this TIME  because of following: Application was not filled out with personal info. NO Rep. Signed dates.  I will scan the letter from them to pt media.([place])  PLEASE let me know if you need my assistance with this.  Thanks  [facility] Patient Advocate [phone]

## 2022-11-15 NOTE — Telephone Encounter (Signed)
Forms faxed for patient assistance

## 2022-11-15 NOTE — Progress Notes (Unsigned)
Patient ID: Scott Valentine, male   DOB: 1956-07-05, 67 y.o.   MRN: [id number]         Chief Complaint:: wellness exam and 76mofollow up (Discuss ozempic,patient states he is having muscle pain)  , smoking, dm, anxiety       HPI:  Scott ZEROis a 67y.o. male here for wellness exam with wife, declines covid booster, o/w up to date                        Also sees podiatry for nail care and foot care with hx of DM.  No hx so far of neuropathy. Denies worsening depressive symptoms, suicidal ideation, or panic; has ongoing anxiety, increased recently for unclear reason and xanax bid no longer working well in addition to lexapro 20 mg .   Pt denies polydipsia, polyuria, or new focal neuro s/s.   Pt denies chest pain, increased sob or doe, wheezing, orthopnea, PND, increased LE swelling, palpitations, dizziness or syncope.   Pt denies fever, wt loss, night sweats, loss of appetite, or other constitutional symptoms    Still smoking, not ready to quit.  Does also have worsening general stamina and occasional varying myalgias he wants to attribute to ozempic, curently has pt assist program application pending for the 2 mg weekly dosing.   Wt Readings from Last 3 Encounters:  11/15/22 200 lb (90.7 kg)  05/15/22 196 lb (88.9 kg)  11/06/21 200 lb 12.8 oz (91.1 kg)   BP Readings from Last 3 Encounters:  11/15/22 (!) 150/102  05/15/22 122/68  11/06/21 120/70   Immunization History  Administered Date(s) Administered   H1N1 06/23/2008   Influenza Whole 07/12/2002, 07/11/2008   Influenza,inj,Quad PF,6+ Mos 07/07/2013, 07/10/2017, 07/13/2018, 06/30/2019, 05/08/2021, 05/15/2022   Influenza-Unspecified 05/24/2015   Moderna Sars-Covid-2 Vaccination 10/20/2019, 11/17/2019, 07/27/2020, 01/04/2021, 08/09/2021   PFIZER(Purple Top)SARS-COV-2 Vaccination 07/22/2022   Pneumococcal Conjugate-13 04/12/2016, 04/11/2017   Pneumococcal Polysaccharide-23 10/10/2017   Respiratory Syncytial Virus Vaccine,Recomb  Aduvanted(Arexvy) 07/15/2022   Td 11/08/1998, 06/01/2007   Tdap 04/11/2017   Zoster Recombinat (Shingrix) 07/13/2018, 10/04/2018   Health Maintenance Due  Topic Date Due   Lung Cancer Screening  Never done      Past Medical History:  Diagnosis Date   Allergy    Anxiety state, unspecified 10/08/2007   Carpal tunnel syndrome    Cervical radiculopathy    numbness in finger tips after cervical surgery    [place]    DVT - secondary vein left leg    Cough 11/08/2009   Diabetes mellitus without complication ([place]    [place]) [place]   right leg, dissolved itself   Dysmetabolic syndrome X [id number]  GERD 05/23/2008   past hx - not current issue    GLUCOSE INTOLERANCE 10/19/2008   HYPERLIPIDEMIA 10/08/2007   HYPERPLASIA PROSTATE UNS W/O UR OBST & OTH LUTS 10/08/2007   Impaired glucose tolerance 12/30/2010   Other and unspecified hyperlipidemia 05/01/2007   Rectal bleeding    STEVENS-JOHNSON SYNDROME, HX OF 10/08/2007   TOBACCO USE, QUIT 10/08/2007   UNS ADVRS EFF UNS RX MEDICINAL&BIOLOGICAL SBSTNC 12/17/2007   Unspecified essential hypertension 05/01/2007   BP increases with being upset    URI 10/29/2007   Past Surgical History:  Procedure Laterality Date   ANAL FISSURE REPAIR  04/25/11   COLONOSCOPY     DIGIT NAIL REMOVAL Right 08/18/2019   Procedure: REMOVAL OF RIGHT THUMB INGROWN NAIL;  Surgeon: Leandrew Koyanagi, MD;  Location: [facility];  Service: Orthopedics;  Laterality: Right;   finger surgury' Left 05/30/2008   3rd finger   HEMORRHOID SURGERY  04/25/11   POLYPECTOMY     POSTERIOR CERVICAL LAMINECTOMY Right 10/21/2018   Procedure: Right Cervical five-six Cervical six-seven Posterior cervical foraminotomies;  Surgeon: Judith Part, MD;  Location: [facility];  Service: Neurosurgery;  Laterality: Right;    reports that he has been smoking cigarettes. He has a 44.00 pack-year smoking history. He has quit using smokeless tobacco. He  reports that he does not drink alcohol and does not use drugs. family history includes Coronary artery disease (age of onset: 46) in his father; Diabetes in his mother; Heart attack in his paternal grandfather; Heart disease in his brother and father; Lung cancer in his father; Mental illness in his mother; Prostate cancer in his paternal grandfather. Allergies  Allergen Reactions   Sulfonamide Derivatives Other (See Comments)    stevens johnson   Atorvastatin Other (See Comments)    myalgias   Ezetimibe Other (See Comments)    myalgias   Current Outpatient Medications on File Prior to Visit  Medication Sig Dispense Refill   aspirin EC 81 MG tablet Take 81 mg by mouth daily.     empagliflozin (JARDIANCE) 25 MG TABS tablet Take 1 tablet by mouth once daily 90 tablet 2   escitalopram (LEXAPRO) 20 MG tablet Take 1 tablet by mouth once daily 90 tablet 3   fexofenadine (ALLEGRA) 180 MG tablet Take 1 tablet (180 mg total) by mouth daily as needed. 90 tablet 3   folic acid (FOLVITE) 1 MG tablet Take 1 tablet (1 mg total) by mouth daily. 90 tablet 3   losartan (COZAAR) 100 MG tablet Take 1 tablet by mouth once daily 90 tablet 3   magnesium oxide (MAG-OX) 400 MG tablet Take 400 mg by mouth daily.     metFORMIN (GLUCOPHAGE-XR) 500 MG 24 hr tablet TAKE 4 TABLETS BY MOUTH ONCE DAILY WITH BREAKFAST 360 tablet 0   metoprolol tartrate (LOPRESSOR) 25 MG tablet Take 1/2 (one-half) tablet by mouth once daily 45 tablet 2   Multiple Vitamin (MULTIVITAMIN) tablet Take 1 tablet by mouth daily.     pioglitazone (ACTOS) 15 MG tablet Take 1 tablet (15 mg total) by mouth daily. 90 tablet 3   rosuvastatin (CRESTOR) 40 MG tablet Take 1 tablet by mouth once daily 90 tablet 2   Semaglutide, 2 MG/DOSE, 8 MG/3ML SOPN Inject 2 mg as directed once a week. 9 mL 3   sildenafil (VIAGRA) 100 MG tablet Take 1 tablet (100 mg total) by mouth daily as needed for erectile dysfunction. 10 tablet 11   vitamin C (ASCORBIC ACID) 500 MG  tablet Take 500 mg by mouth daily.     No current facility-administered medications on file prior to visit.        ROS:  All others reviewed and negative.  Objective        PE:  BP (!) 150/102   Pulse 66   Temp 98.3 F (36.8 C) (Oral)   Ht '5\' 9"'$  (1.753 m)   Wt 200 lb (90.7 kg)   SpO2 99%   BMI 29.53 kg/m                 Constitutional: Pt appears in NAD               HENT: Head: NCAT.  Right Ear: External ear normal.                 Left Ear: External ear normal.                Eyes: . Pupils are equal, round, and reactive to light. Conjunctivae and EOM are normal               Nose: without d/c or deformity               Neck: Neck supple. Gross normal ROM               Cardiovascular: Normal rate and regular rhythm.                 Pulmonary/Chest: Effort normal and breath sounds without rales or wheezing.                Abd:  Soft, NT, ND, + BS, no organomegaly               Neurological: Pt is alert. At baseline orientation, motor grossly intact               Skin: Skin is warm. No rashes, no other new lesions, LE edema - none               Psychiatric: Pt behavior is normal without agitation   Micro: none  Cardiac tracings I have personally interpreted today:  none  Pertinent Radiological findings (summarize): none   Lab Results  Component Value Date   WBC 7.3 11/08/2022   HGB 15.7 11/08/2022   HCT 46.1 11/08/2022   PLT 201.0 11/08/2022   GLUCOSE 114 (H) 11/08/2022   CHOL 108 11/08/2022   TRIG 166.0 (H) 11/08/2022   HDL 40.90 11/08/2022   LDLDIRECT 88.0 05/02/2021   LDLCALC 34 11/08/2022   ALT 20 11/08/2022   AST 21 11/08/2022   NA 138 11/08/2022   K 4.4 11/08/2022   CL 102 11/08/2022   CREATININE 0.90 11/08/2022   BUN 11 11/08/2022   CO2 26 11/08/2022   TSH 2.92 11/08/2022   PSA 1.14 11/08/2022   HGBA1C 6.5 11/08/2022   MICROALBUR 0.7 11/08/2022   Assessment/Plan:  Scott Valentine is a 67 y.o. White or Caucasian [1] male with  has a  past medical history of Allergy, Anxiety state, unspecified (10/08/2007), Carpal tunnel syndrome, Cervical radiculopathy, Clotting disorder ([place]), Cough (11/08/2009), Diabetes mellitus without complication ([place]), DVT (deep venous thrombosis) ([place]) ([id number]), Dysmetabolic syndrome X ([id number]), GERD (05/23/2008), GLUCOSE INTOLERANCE (10/19/2008), HYPERLIPIDEMIA (10/08/2007), HYPERPLASIA PROSTATE UNS W/O UR OBST & OTH LUTS (10/08/2007), Impaired glucose tolerance (12/30/2010), Other and unspecified hyperlipidemia (05/01/2007), Rectal bleeding, STEVENS-JOHNSON SYNDROME, HX OF (10/08/2007), TOBACCO USE, QUIT (10/08/2007), UNS ADVRS EFF UNS RX MEDICINAL&BIOLOGICAL SBSTNC (12/17/2007), Unspecified essential hypertension (05/01/2007), and URI (10/29/2007).  Encounter for well adult exam with abnormal findings Age and sex appropriate education and counseling updated with regular exercise and diet Referrals for preventative services - none needed Immunizations addressed - declines covid booster Smoking counseling  - pt counsled to quit, pt not ready Evidence for depression or other mood disorder - worsening anxiety uncontrolled Most recent labs reviewed. I have personally reviewed and have noted: 1) the patient's medical and social history 2) The patient's current medications and supplements 3) The patient's height, weight, and BMI have been recorded in the chart   Diabetes Lab Results  Component Value Date   HGBA1C 6.5 11/08/2022   Mild elevated, pt to  continue current medical treatment jardiacne 25 mg qd, metformin ER 500 mg - 4 qd, atos 15 qd, and to start ozempic 2 mg weekly soon   Essential hypertension BP Readings from Last 3 Encounters:  11/15/22 (!) 150/102  05/15/22 122/68  11/06/21 120/70   Uncontrolled today probably situational with anxiety worsening, pt to continue medical treatment losartan 100 mg qd, lopressor 25 mg - 1/2 bid   HYPERLIPIDEMIA Lab Results  Component Value Date   LDLCALC 34  11/08/2022   Stable, pt to continue current statin crestor 40 mg qd    Smoker Pt counsled to quit, pt not ready, for pulm referral for LDCT screening  Anxiety state Uncontrolled, cont lexapro 20 mg but change xanax to klonopin 0.5 mg bid prn  Followup: No follow-ups on file.  Cathlean Cower, MD 11/17/2022 7:16 AM [facility]

## 2022-11-15 NOTE — Telephone Encounter (Signed)
Thank you, I have made copies for the pages that are 2 sided and refaxed it to [facility]

## 2022-11-15 NOTE — Patient Instructions (Signed)
Ok to change the lorazepam to clonazepam 0.5 mg twice per day  Please continue all other medications as before, and refills have been done if requested.  Please have the pharmacy call with any other refills you may need.  Please continue your efforts at being more active, low cholesterol diet, and weight control.  You are otherwise up to date with prevention measures today.  Please keep your appointments with your specialists as you may have planned  You will be contacted regarding the referral for: Pulmonary for the LDCT lung cancer screening yearly start  Please quit smoking  Hopefully you will hear soon about the Pt Assist Program with the 2 mg ozempic  Please make an Appointment to return in 6 months, or sooner if needed, also with Lab Appointment for testing done 3-5 days before at the [facility] (so this is for TWO appointments - please see the scheduling desk as you leave)

## 2022-11-17 ENCOUNTER — Encounter: Payer: Self-pay | Admitting: Internal Medicine

## 2022-11-17 NOTE — Assessment & Plan Note (Signed)
Lab Results  Component Value Date   LDLCALC 34 11/08/2022   Stable, pt to continue current statin crestor 40 mg qd

## 2022-11-17 NOTE — Assessment & Plan Note (Signed)
BP Readings from Last 3 Encounters:  11/15/22 (!) 150/102  05/15/22 122/68  11/06/21 120/70   Uncontrolled today probably situational with anxiety worsening, pt to continue medical treatment losartan 100 mg qd, lopressor 25 mg - 1/2 bid

## 2022-11-17 NOTE — Assessment & Plan Note (Signed)
Lab Results  Component Value Date   HGBA1C 6.5 11/08/2022   Mild elevated, pt to continue current medical treatment jardiacne 25 mg qd, metformin ER 500 mg - 4 qd, atos 15 qd, and to start ozempic 2 mg weekly soon

## 2022-11-17 NOTE — Assessment & Plan Note (Signed)
Uncontrolled, cont lexapro 20 mg but change xanax to klonopin 0.5 mg bid prn

## 2022-11-17 NOTE — Assessment & Plan Note (Signed)
Age and sex appropriate education and counseling updated with regular exercise and diet Referrals for preventative services - none needed Immunizations addressed - declines covid booster Smoking counseling  - pt counsled to quit, pt not ready Evidence for depression or other mood disorder - worsening anxiety uncontrolled Most recent labs reviewed. I have personally reviewed and have noted: 1) the patient's medical and social history 2) The patient's current medications and supplements 3) The patient's height, weight, and BMI have been recorded in the chart

## 2022-11-17 NOTE — Assessment & Plan Note (Addendum)
Pt counsled to quit, pt not ready, for pulm referral for LDCT screening

## 2022-11-18 ENCOUNTER — Telehealth: Payer: Self-pay | Admitting: Internal Medicine

## 2022-11-18 ENCOUNTER — Other Ambulatory Visit (HOSPITAL_BASED_OUTPATIENT_CLINIC_OR_DEPARTMENT_OTHER): Payer: Self-pay

## 2022-11-18 MED ORDER — SEMAGLUTIDE (2 MG/DOSE) 8 MG/3ML ~~LOC~~ SOPN
2.0000 mg | PEN_INJECTOR | SUBCUTANEOUS | 3 refills | Status: DC
  Filled 2022-11-18: qty 9, 84d supply, fill #0
  Filled 2023-02-05: qty 9, 84d supply, fill #1

## 2022-11-18 NOTE — Telephone Encounter (Signed)
Patient called and stated that he would like his medication Semaglutide, 2 MG/DOSE, 8 MG/3ML SOPN to be sent to the [place] at [facility]. Patient stated that he called this pharmacy and they have his medication in stock.   Patient is requesting a callback from nurse when medication is sent. Best callback number is  [phone].

## 2022-11-18 NOTE — Telephone Encounter (Signed)
Patient called about his Ozempic. He said his pharmacy has not received anything. He would like a callback at [phone].

## 2022-11-18 NOTE — Telephone Encounter (Signed)
Received message from [place] team, patient assistance forms refaxed with confirmation. Will inform patient

## 2022-11-18 NOTE — Telephone Encounter (Signed)
Ok this is done 

## 2022-11-19 ENCOUNTER — Telehealth: Payer: Self-pay

## 2022-11-19 ENCOUNTER — Other Ambulatory Visit: Payer: Self-pay

## 2022-11-19 DIAGNOSIS — Z87891 Personal history of nicotine dependence: Secondary | ICD-10-CM

## 2022-11-19 DIAGNOSIS — F17210 Nicotine dependence, cigarettes, uncomplicated: Secondary | ICD-10-CM

## 2022-11-19 NOTE — Telephone Encounter (Signed)
Received notification from [employer]) regarding patient assistance DENIAL for [employer].  DUE to reported income EXCEEDS current program eligibility limits. THIS DECISION IS FINAL.   LETTER IS SCANNED IN TO CHART PLEASE BE ADVISED  [facility] Patient Advocate [phone]

## 2022-11-23 ENCOUNTER — Other Ambulatory Visit: Payer: Self-pay | Admitting: Internal Medicine

## 2022-11-26 ENCOUNTER — Telehealth: Payer: Self-pay

## 2022-11-26 NOTE — Telephone Encounter (Signed)
Patient assistance received, patient informed and states he will pick up from office in a few days. Medication place in fridge.

## 2022-12-10 ENCOUNTER — Encounter: Payer: Self-pay | Admitting: Acute Care

## 2022-12-10 ENCOUNTER — Ambulatory Visit (INDEPENDENT_AMBULATORY_CARE_PROVIDER_SITE_OTHER): Payer: No Typology Code available for payment source | Admitting: Acute Care

## 2022-12-10 DIAGNOSIS — F17210 Nicotine dependence, cigarettes, uncomplicated: Secondary | ICD-10-CM | POA: Diagnosis not present

## 2022-12-10 NOTE — Patient Instructions (Signed)
Thank you for participating in the [facility] Lung Cancer Screening Program. It was our pleasure to meet you today. We will call you with the results of your scan within the next few days. Your scan will be assigned a Lung RADS category score by the physicians reading the scans.  This Lung RADS score determines follow up scanning.  See below for description of categories, and follow up screening recommendations. We will be in touch to schedule your follow up screening annually or based on recommendations of our providers. We will fax a copy of your scan results to your Primary Care Physician, or the physician who referred you to the program, to ensure they have the results. Please call the office if you have any questions or concerns regarding your scanning experience or results.  Our office number is [phone]. Please speak with Denise Phelps, RN. , or  Denise Buckner RN, They are  our Lung Cancer Screening RN.'s If They are unavailable when you call, Please leave a message on the voice mail. We will return your call at our earliest convenience.This voice mail is monitored several times a day.  Remember, if your scan is normal, we will scan you annually as long as you continue to meet the criteria for the program. (Age 50-80, Current smoker or smoker who has quit within the last 15 years). If you are a smoker, remember, quitting is the single most powerful action that you can take to decrease your risk of lung cancer and other pulmonary, breathing related problems. We know quitting is hard, and we are here to help.  Please let us know if there is anything we can do to help you meet your goal of quitting. If you are a former smoker, congratulations. We are proud of you! Remain smoke free! Remember you can refer friends or family members through the number above.  We will screen them to make sure they meet criteria for the program. Thank you for helping us take better care of you by  participating in Lung Screening.  You can receive free nicotine replacement therapy ( patches, gum or mints) by calling 1-800-QUIT NOW. Please call so we can get you on the path to becoming  a non-smoker. I know it is hard, but you can do this!  Lung RADS Categories:  Lung RADS 1: no nodules or definitely non-concerning nodules.  Recommendation is for a repeat annual scan in 12 months.  Lung RADS 2:  nodules that are non-concerning in appearance and behavior with a very low likelihood of becoming an active cancer. Recommendation is for a repeat annual scan in 12 months.  Lung RADS 3: nodules that are probably non-concerning , includes nodules with a low likelihood of becoming an active cancer.  Recommendation is for a 6-month repeat screening scan. Often noted after an upper respiratory illness. We will be in touch to make sure you have no questions, and to schedule your 6-month scan.  Lung RADS 4 A: nodules with concerning findings, recommendation is most often for a follow up scan in 3 months or additional testing based on our provider's assessment of the scan. We will be in touch to make sure you have no questions and to schedule the recommended 3 month follow up scan.  Lung RADS 4 B:  indicates findings that are concerning. We will be in touch with you to schedule additional diagnostic testing based on our provider's  assessment of the scan.  Other options for assistance in smoking cessation (   As covered by your insurance benefits)  Hypnosis for smoking cessation  [employer] [phone]  Acupuncture for smoking cessation  [facility] [phone]   

## 2022-12-10 NOTE — Progress Notes (Signed)
Virtual Visit via Telephone Note  I connected with Scott Valentine on 12/10/22 at  3:00 PM EDT by telephone and verified that I am speaking with the correct person using two identifiers.  Location: Patient:  At home Provider: [place], [place], [place], [address]    I discussed the limitations, risks, security and privacy concerns of performing an evaluation and management service by telephone and the availability of in person appointments. I also discussed with the patient that there may be a patient responsible charge related to this service. The patient expressed understanding and agreed to proceed.    Shared Decision Making Visit Lung Cancer Screening Program [phone])   Eligibility: Age 67 y.o. Pack Years Smoking History Calculation 48 pack year smoking hx (# packs/per year x # years smoked) Recent History of coughing up blood  no Unexplained weight loss? no ( >Than 15 pounds within the last 6 months ) Prior History Lung / other cancer no (Diagnosis within the last 5 years already requiring surveillance chest CT Scans). Smoking Status Current Smoker Former Smokers: Years since quit:  NA  Quit Date:  NA  Visit Components: Discussion included one or more decision making aids. yes Discussion included risk/benefits of screening. yes Discussion included potential follow up diagnostic testing for abnormal scans. yes Discussion included meaning and risk of over diagnosis. yes Discussion included meaning and risk of False Positives. yes Discussion included meaning of total radiation exposure. yes  Counseling Included: Importance of adherence to annual lung cancer LDCT screening. yes Impact of comorbidities on ability to participate in the program. yes Ability and willingness to under diagnostic treatment. yes  Smoking Cessation Counseling: Current Smokers:  Discussed importance of smoking cessation. yes Information about tobacco cessation classes and interventions  provided to patient. yes Patient provided with "ticket" for LDCT Scan. yes Symptomatic Patient. no  Counseling NA Diagnosis Code: Tobacco Use Z72.0 Asymptomatic Patient yes  Counseling (Intermediate counseling: > three minutes counseling) [id number] Former Smokers:  Discussed the importance of maintaining cigarette abstinence. yes Diagnosis Code: Personal History of Nicotine Dependence. [id number] Information about tobacco cessation classes and interventions provided to patient. Yes Patient provided with "ticket" for LDCT Scan. yes Written Order for Lung Cancer Screening with LDCT placed in Epic. Yes (CT Chest Lung Cancer Screening Low Dose W/O CM) [id number] Z12.2-Screening of respiratory organs Z87.891-Personal history of nicotine dependence  I have spent 25 minutes of face to face/ virtual visit   time with  Scott Valentine discussing the risks and benefits of lung cancer screening. We viewed / discussed a power point together that explained in detail the above noted topics. We paused at intervals to allow for questions to be asked and answered to ensure understanding.We discussed that the single most powerful action that he can take to decrease his risk of developing lung cancer is to quit smoking. We discussed whether or not he is ready to commit to setting a quit date. We discussed options for tools to aid in quitting smoking including nicotine replacement therapy, non-nicotine medications, support groups, Quit Smart classes, and behavior modification. We discussed that often times setting smaller, more achievable goals, such as eliminating 1 cigarette a day for a week and then 2 cigarettes a day for a week can be helpful in slowly decreasing the number of cigarettes smoked. This allows for a sense of accomplishment as well as providing a clinical benefit. I provided  him  with smoking cessation  information  with contact information for community resources,  classes, free nicotine replacement therapy, and  access to mobile apps, text messaging, and on-line smoking cessation help. I have also provided  him  the office contact information in the event he needs to contact me, or the screening staff. We discussed the time and location of the scan, and that either Doroteo Glassman RN, Joella Prince, RN  or I will call / send a letter with the results within 24-72 hours of receiving them. The patient verbalized understanding of all of  the above and had no further questions upon leaving the office. They have my contact information in the event they have any further questions.  I spent 3 minutes counseling on smoking cessation and the health risks of continued tobacco abuse.  I explained to the patient that there has been a high incidence of coronary artery disease noted on these exams. I explained that this is a non-gated exam therefore degree or severity cannot be determined. This patient is on statin therapy. I have asked the patient to follow-up with their PCP regarding any incidental finding of coronary artery disease and management with diet or medication as their PCP  feels is clinically indicated. The patient verbalized understanding of the above and had no further questions upon completion of the visit.      Magdalen Spatz, NP 12/10/2022

## 2022-12-18 ENCOUNTER — Ambulatory Visit (HOSPITAL_BASED_OUTPATIENT_CLINIC_OR_DEPARTMENT_OTHER): Admission: RE | Admit: 2022-12-18 | Payer: PPO | Source: Ambulatory Visit

## 2022-12-23 ENCOUNTER — Telehealth: Payer: Self-pay | Admitting: Internal Medicine

## 2022-12-23 NOTE — Telephone Encounter (Signed)
Patient said that on his physical last year the insurance says it looks like he was build twice.  Can you check into this.  Patient's number:  [phone]

## 2022-12-31 DIAGNOSIS — B351 Tinea unguium: Secondary | ICD-10-CM | POA: Diagnosis not present

## 2022-12-31 DIAGNOSIS — M79676 Pain in unspecified toe(s): Secondary | ICD-10-CM | POA: Diagnosis not present

## 2022-12-31 DIAGNOSIS — E1142 Type 2 diabetes mellitus with diabetic polyneuropathy: Secondary | ICD-10-CM | POA: Diagnosis not present

## 2022-12-31 DIAGNOSIS — L84 Corns and callosities: Secondary | ICD-10-CM | POA: Diagnosis not present

## 2023-01-03 ENCOUNTER — Ambulatory Visit (HOSPITAL_BASED_OUTPATIENT_CLINIC_OR_DEPARTMENT_OTHER)
Admission: RE | Admit: 2023-01-03 | Discharge: 2023-01-03 | Disposition: A | Discharge status: Home / Self Care | Payer: PPO | Source: Ambulatory Visit | Attending: Acute Care | Admitting: Acute Care

## 2023-01-03 DIAGNOSIS — F17210 Nicotine dependence, cigarettes, uncomplicated: Secondary | ICD-10-CM | POA: Diagnosis not present

## 2023-01-03 DIAGNOSIS — Z87891 Personal history of nicotine dependence: Secondary | ICD-10-CM | POA: Diagnosis not present

## 2023-01-08 ENCOUNTER — Telehealth: Payer: Self-pay | Admitting: Acute Care

## 2023-01-08 ENCOUNTER — Encounter: Payer: Self-pay | Admitting: Internal Medicine

## 2023-01-08 DIAGNOSIS — R9431 Abnormal electrocardiogram [ECG] [EKG]: Secondary | ICD-10-CM

## 2023-01-08 DIAGNOSIS — E782 Mixed hyperlipidemia: Secondary | ICD-10-CM

## 2023-01-08 DIAGNOSIS — F17210 Nicotine dependence, cigarettes, uncomplicated: Secondary | ICD-10-CM

## 2023-01-08 DIAGNOSIS — R911 Solitary pulmonary nodule: Secondary | ICD-10-CM

## 2023-01-08 DIAGNOSIS — I10 Essential (primary) hypertension: Secondary | ICD-10-CM

## 2023-01-08 DIAGNOSIS — I2510 Atherosclerotic heart disease of native coronary artery without angina pectoris: Secondary | ICD-10-CM | POA: Insufficient documentation

## 2023-01-08 DIAGNOSIS — Z87891 Personal history of nicotine dependence: Secondary | ICD-10-CM

## 2023-01-08 NOTE — Telephone Encounter (Signed)
Order placed for 3 month nodule follow up low dose lung CT.

## 2023-01-08 NOTE — Addendum Note (Signed)
Addended by: Corwin Levins on: 01/08/2023 06:43 PM   Modules accepted: Orders

## 2023-01-08 NOTE — Telephone Encounter (Signed)
Please to contact pt - needs Cardiac CT score if ok with him to see how much CAD he has and if needs to see cardiology  Please let him know this is not covered by insurance and costs $99; I will order this but he can always say no about doing this  If he would not want to do this, please let me know and I will refer directly to Cardiology for further consideration, as maybe they would do another more expensive test that would be covered by insurance to see how much the blockages really are  thanks

## 2023-01-08 NOTE — Telephone Encounter (Signed)
I have called the patient  and his wife with the low dose CT results. His scan was read as a Lung Rad 0. There are Endobronchial nodular opacities seen in several of the segmental and subsegmental right lower bronchi, likely mucous plugging. As well as Ground-glass opacities and solid nodular opacities of the left lower lobe likely due to infection or aspiration. The patient states he has not been sick. No fever or discolored secretions. He is currently having issues with allergies. He does take Allegra for his symptoms. Per his wife he has been coughing a lot and snoring. He states he does not choke on his food or drink.  As the patient is not symptomatic, and does not appear to need treatment, we will do a 3 month follow up LDCT to assess for resolution. Pt. And wife are in agreement with this plan. If the CT Chest does not clear, he may need to speak with his PCP about a swallow evaluation . I did notify the patient that there was notation of severe CAD on his scan as well as aortic atherosclerosis. He does have diabetes, so will reach out to Dr. Jonny Ruiz to notify him of these results. Pt. Is on statin therapy Angelique Blonder, please place order for 3 month follow up low dose. Thanks so much

## 2023-01-09 NOTE — Telephone Encounter (Signed)
Called Pt and left voicemail to give us a call back. 

## 2023-01-09 NOTE — Telephone Encounter (Signed)
Pt called back and is willing to pay for the test, told him look out for a call to get it set up.

## 2023-01-16 ENCOUNTER — Telehealth: Payer: Self-pay | Admitting: Internal Medicine

## 2023-01-16 NOTE — Telephone Encounter (Signed)
We have received FMLA, to be filled out by provider. Patient requested to send it via Fax within 7-days. Document is located in providers tray at front office.Please advise at Mobile [phone] (mobile)   Please fax to: [phone]

## 2023-01-21 NOTE — Telephone Encounter (Signed)
I didn't receive the form from the box. Wasn't there when checked.

## 2023-01-28 ENCOUNTER — Other Ambulatory Visit: Payer: Self-pay

## 2023-01-28 ENCOUNTER — Other Ambulatory Visit: Payer: Self-pay | Admitting: Internal Medicine

## 2023-02-05 ENCOUNTER — Other Ambulatory Visit (HOSPITAL_BASED_OUTPATIENT_CLINIC_OR_DEPARTMENT_OTHER): Payer: Self-pay

## 2023-02-14 ENCOUNTER — Telehealth: Payer: Self-pay | Admitting: Internal Medicine

## 2023-02-14 DIAGNOSIS — I2510 Atherosclerotic heart disease of native coronary artery without angina pectoris: Secondary | ICD-10-CM

## 2023-02-14 DIAGNOSIS — I10 Essential (primary) hypertension: Secondary | ICD-10-CM

## 2023-02-14 DIAGNOSIS — E1165 Type 2 diabetes mellitus with hyperglycemia: Secondary | ICD-10-CM

## 2023-02-14 DIAGNOSIS — F17200 Nicotine dependence, unspecified, uncomplicated: Secondary | ICD-10-CM

## 2023-02-14 NOTE — Telephone Encounter (Signed)
Patient still needs his cardiology referral.

## 2023-02-18 NOTE — Telephone Encounter (Signed)
I think he means the CT Cardiac Score testing ordered Jan 08 2023 with Jefm Bryant -   I will place another order

## 2023-02-18 NOTE — Telephone Encounter (Signed)
Only pulmonology referral was placed will forward to MD desktop to review.Marland KitchenShearon Stalls

## 2023-02-19 NOTE — Telephone Encounter (Signed)
Notified pt w/MD response.../lmb 

## 2023-03-01 ENCOUNTER — Other Ambulatory Visit: Payer: Self-pay | Admitting: Internal Medicine

## 2023-03-03 NOTE — Telephone Encounter (Signed)
PT CAME IN TO SEE WETHER IF HIS PRESCRIPTION WAS SENT OUT. PRESCRIPTION 6.22.24 WASN'T SENT OUT TO [facility] [place] [facility]. DUE ERX ERROR PLEASE RESEND ASAP DUE TO PT BEING OUT OF MEDS FOR 2-3 DAYS

## 2023-03-05 ENCOUNTER — Telehealth: Payer: Self-pay

## 2023-03-05 NOTE — Telephone Encounter (Signed)
Pt ozempic has been placed in the fridge for pick up by the nurse station.

## 2023-04-04 ENCOUNTER — Ambulatory Visit (HOSPITAL_BASED_OUTPATIENT_CLINIC_OR_DEPARTMENT_OTHER)
Admission: RE | Admit: 2023-04-04 | Discharge: 2023-04-04 | Disposition: A | Discharge status: Home / Self Care | Payer: PPO | Source: Ambulatory Visit | Attending: Internal Medicine | Admitting: Internal Medicine

## 2023-04-04 ENCOUNTER — Ambulatory Visit (HOSPITAL_BASED_OUTPATIENT_CLINIC_OR_DEPARTMENT_OTHER)
Admission: RE | Admit: 2023-04-04 | Discharge: 2023-04-04 | Disposition: A | Discharge status: Home / Self Care | Payer: PPO | Source: Ambulatory Visit | Attending: Acute Care | Admitting: Acute Care

## 2023-04-04 DIAGNOSIS — Z87891 Personal history of nicotine dependence: Secondary | ICD-10-CM | POA: Insufficient documentation

## 2023-04-04 DIAGNOSIS — R918 Other nonspecific abnormal finding of lung field: Secondary | ICD-10-CM | POA: Diagnosis not present

## 2023-04-04 DIAGNOSIS — E1165 Type 2 diabetes mellitus with hyperglycemia: Secondary | ICD-10-CM | POA: Diagnosis not present

## 2023-04-04 DIAGNOSIS — I2510 Atherosclerotic heart disease of native coronary artery without angina pectoris: Secondary | ICD-10-CM

## 2023-04-04 DIAGNOSIS — I10 Essential (primary) hypertension: Secondary | ICD-10-CM

## 2023-04-04 DIAGNOSIS — F17200 Nicotine dependence, unspecified, uncomplicated: Secondary | ICD-10-CM | POA: Diagnosis not present

## 2023-04-04 DIAGNOSIS — J432 Centrilobular emphysema: Secondary | ICD-10-CM | POA: Diagnosis not present

## 2023-04-04 DIAGNOSIS — R911 Solitary pulmonary nodule: Secondary | ICD-10-CM | POA: Diagnosis not present

## 2023-04-06 ENCOUNTER — Other Ambulatory Visit: Payer: Self-pay | Admitting: Internal Medicine

## 2023-04-06 DIAGNOSIS — R931 Abnormal findings on diagnostic imaging of heart and coronary circulation: Secondary | ICD-10-CM

## 2023-04-11 ENCOUNTER — Other Ambulatory Visit: Payer: Self-pay | Admitting: Internal Medicine

## 2023-04-11 DIAGNOSIS — R931 Abnormal findings on diagnostic imaging of heart and coronary circulation: Secondary | ICD-10-CM

## 2023-04-15 ENCOUNTER — Telehealth: Payer: Self-pay | Admitting: Acute Care

## 2023-04-15 NOTE — Telephone Encounter (Signed)
Received results on [facility] dashboard. Impression below. Will forward to Kandice Robinsons, NP.     IMPRESSION: 1. Lung-RADS 4B, suspicious. Additional imaging evaluation or consultation with Pulmonology or Thoracic Surgery recommended. Nodular focus of consolidation in the posterior basilar left lower lobe measures 27.9 mm in volume derived mean diameter, largely new, correlating to the site of previous indistinct patchy tree-in-bud opacity. Previously described endobronchial foci in the left lower lobe have resolved, with new sites of patchy debris in the left lower lobe airways. Findings favor a recurrent/waxing and waning left lower lobe infectious/inflammatory process such as due to recurrent aspiration. Follow up low-dose chest CT without contrast in 3 months (please use the following order, "CT CHEST LCS NODULE FOLLOW-UP W/O CM") is recommended. 2. Three-vessel coronary atherosclerosis. 3. Aortic Atherosclerosis (ICD10-I70.0) and Emphysema (ICD10-J43.9).   These results will be called to the ordering clinician or representative by the Radiologist Assistant, and communication documented in the PACS or [employer].     Electronically Signed   By: Delbert Phenix M.D.   On: 04/15/2023 10:44

## 2023-04-17 ENCOUNTER — Other Ambulatory Visit: Payer: Self-pay

## 2023-04-17 DIAGNOSIS — Z87891 Personal history of nicotine dependence: Secondary | ICD-10-CM

## 2023-04-17 DIAGNOSIS — R911 Solitary pulmonary nodule: Secondary | ICD-10-CM

## 2023-04-17 NOTE — Telephone Encounter (Signed)
  I have called the patient with the results of his LDCT. He has these waxing and waning nodules .largest of which is  27 mm.This was a 3 month follow up. He denies being sick , he denies aspirating. My plan is to do a 3 month follow up, as radiology recommended. If the next scan is also abnormal, we will get him in to see someone here and order a swallow study. I could also see him in the office before the next scan, treat him with Augmentin and make sure he is optimized before the next scan.  I have reached out to Dr. Tonia Brooms to see if he feels one plan has more merritt than the other.  The patient is in agreement with the 3 month follow up scan. I told him I would call him back if there was a change in the plan.  D,D,E, please order 3 month follow up scan, and fax results to PCP.  Thanks so much

## 2023-04-17 NOTE — Telephone Encounter (Signed)
Results/plan faxed to PCP and new order placed for 3 months LDCT follow up for nodule

## 2023-04-22 DIAGNOSIS — B351 Tinea unguium: Secondary | ICD-10-CM | POA: Diagnosis not present

## 2023-04-22 DIAGNOSIS — L84 Corns and callosities: Secondary | ICD-10-CM | POA: Diagnosis not present

## 2023-04-22 DIAGNOSIS — M79676 Pain in unspecified toe(s): Secondary | ICD-10-CM | POA: Diagnosis not present

## 2023-04-22 DIAGNOSIS — E1142 Type 2 diabetes mellitus with diabetic polyneuropathy: Secondary | ICD-10-CM | POA: Diagnosis not present

## 2023-04-24 ENCOUNTER — Encounter (INDEPENDENT_AMBULATORY_CARE_PROVIDER_SITE_OTHER): Payer: Self-pay

## 2023-04-30 ENCOUNTER — Other Ambulatory Visit: Payer: Self-pay | Admitting: Internal Medicine

## 2023-04-30 ENCOUNTER — Other Ambulatory Visit: Payer: Self-pay

## 2023-05-06 NOTE — Progress Notes (Unsigned)
Cardiology Office Note:  .   Date:  05/07/2023 ID:  Scott Valentine, DOB 06-Feb-1956, MRN [id number] PCP: Corwin Levins, MD [facility] Providers Cardiologist:  None   Patient Profile: .      PMH CAD CT calcium score 04/10/2023 with CAC 2614 (98th percentile) Aortic atherosclerosis Moderate aortic valve calcifications (AV calcium score 1114) Tobacco abuse 48 year pack history Hypertension Hyperlipidemia Type 2 DM Anxiety DVT in left leg Took aspirin, was told clot was superficial       History of Present Illness: .   Scott Valentine is a very pleasant  67 y.o. male  who is here today for new patient consult for elevated coronary calcium score on CT. He is accompanied by his wife. His understanding was that he was being enrolled in a lung cancer program and after initial testing, was told there was a concern for something in his heart. 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 Since retirement 3 years ago, he remains active at home with regular yard work and home projects. He does his own Lobbyist work, Pension scheme manager, and building. He does not participate in sustained exercise. Activity somewhat limited by calouses on bottom of feet, gets them trimmed by podiatrist and by neck pain from previous cervical spine surgery. Reports he had a stress test many years ago and at stage 2 he was put on a stretcher and told there was concern for stroke due to his BP being so high.  He was started on low-dose Lopressor and since that time reports no major issues with BP. Was placed on Ozempic by PDP and has achieved 35 lb weight loss.  He occasionally gets fatigued after working several  hours in the heat but it sounds more like body fatigue than dyspnea. No chest pain, palpitations, orthopnea, PND, presyncope, syncope.  His wife reports he coughs a lot, usually worse in the mornings. He has smoked as much as 3 ppd in the past. Tried to quit smoking for his family,  reports no one could stand to be around him and he did not want to talk to anyone.   Family history: Mother - heart disease, CABG x 4, died age 48s Father - heart disease, MI x 2, died from Stage 4 lung and prostate cancer Brother - murmur  ASCVD Risk Score: 23.9%  Diet: Eats less since being on Ozempic Eats most meals at home, variety of foods, but usually not healthy choices Admits he does not avoid any certain foods Cooks with margarine, does not frequently fry foods   Activity: Active at home, does painting, house repairs, has a lot of skills  Push mows plus mowing with tractor  ROS: See HPI       Studies Reviewed: Marland Kitchen        EKG Interpretation Date/Time:  Wednesday May 07 2023 08:31:48 EDT Ventricular Rate:  60 PR Interval:  150 QRS Duration:  100 QT Interval:  422 QTC Calculation: 422 R Axis:   19  Text Interpretation: Normal sinus rhythm Normal ECG When compared with ECG of 14-Oct-2018 10:11, No significant change was found Confirmed by Eligha Bridegroom [phone]) on 05/07/2023 8:52:50 AM   Risk Assessment/Calculations:         Physical Exam:    Today's Vitals   05/07/23 0826  BP: 118/62  Pulse: 60  Weight: 198 lb (89.8 kg)  Height: 5\' 9"  (1.753 m)   Body mass index is 29.24 kg/m.  Wt Readings from Last 3 Encounters:  04/15/23 159 lb 12.8 oz (72.5 kg)  02/05/23 163 lb (73.9 kg)    GEN: Well nourished, well developed in no acute distress NECK: No JVD; No carotid bruits CARDIAC: RRR, systolic murmur heard throughout chest, no rubs, gallops RESPIRATORY:  Clear to auscultation without rales, wheezing or rhonchi  ABDOMEN: Soft, non-tender, non-distended EXTREMITIES:  No edema; No deformity     ASSESSMENT AND PLAN: .    CAD without angina: Coronary artery calcium score 2614 (98th percentile) and moderate aortic valve calcifications on CT 04/04/2023.  He denies chest pain, dyspnea,  palpitations. He does not participate in regular exercise and therefore  has not had periods of sustained activity. He also has a long history of smoking. Due to multiple risk factors, we will get lexiscan myoview for evaluation of ischemia.  Hyperlipidemia LDL goal < 70: LDL 34, TGs 166. He has been on rosuvastatin 40 mg for some time. He is scheduled for fasting lab work with PCP next week.  I will reach out to Dr. Jonny Ruiz to request apolipoprotein B and LP(a) be added onto lab orders for further risk stratification.  Encouraged dietary changes to reduce triglycerides.  Continue rosuvastatin.  Murmur: He has a systolic murmur on exam.  Reports it has been reported recently by PCP but no prior testing. We will get echocardiogram for further evaluation of structural heart disease.   Hypertension: BP is well controlled on current anti-hypertensive agents.   Tobacco dependence: Has tried Chantix, patches, gum, hypnotism. Chantix led to depression and he was referred to psychologist. He reports trying to quit for his family, not for himself. He is not motivated to quit. Complete cessation advised.   CV Risk Assessment: High ASCVD risk score at 23.9%. We reviewed the factors that increase risk including treatment for hypertension, diabetes, smoking, and high cholesterol. His LDL cholesterol and BP are well controlled.  We will monitor for additional risk factors of LP(a) and apolipoprotein B. Will get nuclear stress test for evaluation of ischemia due to risk factors and elevated CAC score.    Plan/Goals:  Activity: Encouraged increased physical activity to achieve sustained exercise for 30 minutes 5 days per week plus strength training  Diet: reduce intake of saturated fat and incorporate more plant based options      Informed Consent   Shared Decision Making/Informed Consent The risks [chest pain, shortness of breath, cardiac arrhythmias, dizziness, blood pressure fluctuations, myocardial infarction, stroke/transient ischemic attack, nausea, vomiting, allergic reaction,  radiation exposure, metallic taste sensation and life-threatening complications (estimated to be 1 in 10,000)], benefits (risk stratification, diagnosing coronary artery disease, treatment guidance) and alternatives of a nuclear stress test were discussed in detail with Mr. Nolazco and he agrees to proceed.     Dispo: 3 months with me  Signed, Eligha Bridegroom, NP-C

## 2023-05-07 ENCOUNTER — Other Ambulatory Visit: Payer: Self-pay | Admitting: Internal Medicine

## 2023-05-07 ENCOUNTER — Encounter (HOSPITAL_BASED_OUTPATIENT_CLINIC_OR_DEPARTMENT_OTHER): Payer: Self-pay | Admitting: Nurse Practitioner

## 2023-05-07 ENCOUNTER — Ambulatory Visit (HOSPITAL_BASED_OUTPATIENT_CLINIC_OR_DEPARTMENT_OTHER): Payer: PPO | Admitting: Nurse Practitioner

## 2023-05-07 VITALS — BP 118/62 | HR 60 | Ht 69.0 in | Wt 198.0 lb

## 2023-05-07 DIAGNOSIS — I10 Essential (primary) hypertension: Secondary | ICD-10-CM | POA: Diagnosis not present

## 2023-05-07 DIAGNOSIS — R011 Cardiac murmur, unspecified: Secondary | ICD-10-CM | POA: Diagnosis not present

## 2023-05-07 DIAGNOSIS — Z7189 Other specified counseling: Secondary | ICD-10-CM

## 2023-05-07 DIAGNOSIS — E782 Mixed hyperlipidemia: Secondary | ICD-10-CM

## 2023-05-07 DIAGNOSIS — I2510 Atherosclerotic heart disease of native coronary artery without angina pectoris: Secondary | ICD-10-CM | POA: Diagnosis not present

## 2023-05-07 DIAGNOSIS — R931 Abnormal findings on diagnostic imaging of heart and coronary circulation: Secondary | ICD-10-CM

## 2023-05-07 DIAGNOSIS — E785 Hyperlipidemia, unspecified: Secondary | ICD-10-CM

## 2023-05-07 DIAGNOSIS — F17200 Nicotine dependence, unspecified, uncomplicated: Secondary | ICD-10-CM

## 2023-05-07 NOTE — Patient Instructions (Signed)
Medication Instructions:  Your physician recommends that you continue on your current medications as directed. Please refer to the Current Medication list given to you today.  *If you need a refill on your cardiac medications before your next appointment, please call your pharmacy*   Lab Work: Labs with Primary Care, please be fasting   Testing/Procedures: Your physician has requested that you have an echocardiogram (before lexi) . Echocardiography is a painless test that uses sound waves to create images of your heart. It provides your doctor with information about the size and shape of your heart and how well your heart's chambers and valves are working. This procedure takes approximately one hour. There are no restrictions for this procedure. Please do NOT wear cologne, perfume, aftershave, or lotions (deodorant is allowed). Please arrive 15 minutes prior to your appointment time.  Your physician has requested that you have a lexiscan myoview. For further information please visit [email or web address] Please follow instruction sheet, as given.   Follow-Up: At [facility], you and your health needs are our priority.  As part of our continuing mission to provide you with exceptional heart care, we have created designated Provider Care Teams.  These Care Teams include your primary Cardiologist (physician) and Advanced Practice Providers (APPs -  Physician Assistants and Nurse Practitioners) who all work together to provide you with the care you need, when you need it.  We recommend signing up for the patient portal called "MyChart".  Sign up information is provided on this After Visit Summary.  MyChart is used to connect with patients for Virtual Visits (Telemedicine).  Patients are able to view lab/test results, encounter notes, upcoming appointments, etc.  Non-urgent messages can be sent to your provider as well.   To learn more about what you can do with MyChart, go to  [email or web address].    Your next appointment:   3 months with Eligha Bridegroom at [facility]   Other Instructions Adopting a Healthy Lifestyle.   Weight: Know what a healthy weight is for you (roughly BMI <25) and aim to maintain this. You can calculate your body mass index on your smart phone. Unfortunately, this is not the most accurate measure of healthy weight, but it is the simplest measurement to use. A more accurate measurement involves body scanning which measures lean muscle, fat tissue and bony density. We do not have this equipment at [facility].    Diet: Aim for 7+ servings of fruits and vegetables daily Limit animal fats in diet for cholesterol and heart health - choose grass fed whenever available Avoid highly processed foods (fast food burgers, tacos, fried chicken, pizza, hot dogs, french fries)  Saturated fat comes in the form of butter, lard, coconut oil, margarine, partially hydrogenated oils, and fat in meat. These increase your risk of cardiovascular disease.  Use healthy plant oils, such as olive, canola, soy, corn, sunflower and peanut.  Whole foods such as fruits, vegetables and whole grains have fiber  Men need > 38 grams of fiber per day Women need > 25 grams of fiber per day  Load up on vegetables and fruits - one-half of your plate: Aim for color and variety, and remember that potatoes dont count. Go for whole grains - one-quarter of your plate: Whole wheat, barley, wheat berries, quinoa, oats, brown rice, and foods made with them. If you want pasta, go with whole wheat pasta. Protein power - one-quarter of your plate: Fish, chicken, beans, and nuts are all healthy, versatile protein sources.  Limit red meat. You need carbohydrates for energy! The type of carbohydrate is more important than the amount. Choose carbohydrates such as vegetables, fruits, whole grains, beans, and nuts in the place of white rice, white pasta, potatoes (baked or fried),  macaroni and cheese, cakes, cookies, and donuts.  If youre thirsty, drink water. Coffee and tea are good in moderation, but skip sugary drinks and limit milk and dairy products to one or two daily servings. Keep sugar intake at 6 teaspoons or 24 grams or LESS       Exercise: Aim for 150 min of moderate intensity exercise weekly for heart health, and weights twice weekly for bone health Stay active - any steps are better than no steps! Aim for 7-9 hours of sleep daily

## 2023-05-13 ENCOUNTER — Other Ambulatory Visit (INDEPENDENT_AMBULATORY_CARE_PROVIDER_SITE_OTHER): Payer: PPO

## 2023-05-13 DIAGNOSIS — I2510 Atherosclerotic heart disease of native coronary artery without angina pectoris: Secondary | ICD-10-CM

## 2023-05-13 DIAGNOSIS — E559 Vitamin D deficiency, unspecified: Secondary | ICD-10-CM

## 2023-05-13 DIAGNOSIS — E782 Mixed hyperlipidemia: Secondary | ICD-10-CM

## 2023-05-13 DIAGNOSIS — E1165 Type 2 diabetes mellitus with hyperglycemia: Secondary | ICD-10-CM

## 2023-05-13 LAB — LIPID PANEL
Cholesterol: 99 mg/dL (ref 0–200)
HDL: 39.9 mg/dL (ref >39.00)
LDL Cholesterol: 42 mg/dL (ref 0–99)
NonHDL: 59.56
Total CHOL/HDL Ratio: 2
Triglycerides: 89 mg/dL (ref 0.0–149.0)
VLDL: 17.8 mg/dL (ref 0.0–40.0)

## 2023-05-13 LAB — HEMOGLOBIN A1C: Hgb A1c MFr Bld: 6.6 % — ABNORMAL HIGH (ref 4.6–6.5)

## 2023-05-13 LAB — HEPATIC FUNCTION PANEL
ALT: 23 U/L (ref 0–53)
AST: 23 U/L (ref 0–37)
Albumin: 4 g/dL (ref 3.5–5.2)
Alkaline Phosphatase: 47 U/L (ref 39–117)
Bilirubin, Direct: 0.1 mg/dL (ref 0.0–0.3)
Total Bilirubin: 0.4 mg/dL (ref 0.2–1.2)
Total Protein: 7.2 g/dL (ref 6.0–8.3)

## 2023-05-13 LAB — BASIC METABOLIC PANEL
BUN: 15 mg/dL (ref 6–23)
CO2: 26 meq/L (ref 19–32)
Calcium: 9 mg/dL (ref 8.4–10.5)
Chloride: 104 meq/L (ref 96–112)
Creatinine, Ser: 0.89 mg/dL (ref 0.40–1.50)
GFR: 90 mL/min (ref >60.00)
Glucose, Bld: 113 mg/dL — ABNORMAL HIGH (ref 70–99)
Potassium: 4.3 meq/L (ref 3.5–5.1)
Sodium: 137 meq/L (ref 135–145)

## 2023-05-14 LAB — APOLIPOPROTEIN B: Apolipoprotein B: 58 mg/dL (ref <90)

## 2023-05-14 LAB — VITAMIN D 25 HYDROXY (VIT D DEFICIENCY, FRACTURES): VITD: 52.18 ng/mL (ref 30.00–100.00)

## 2023-05-15 LAB — LIPOPROTEIN A (LPA): Lipoprotein (a): <10 nmol/L (ref <75)

## 2023-05-16 DIAGNOSIS — H40033 Anatomical narrow angle, bilateral: Secondary | ICD-10-CM | POA: Diagnosis not present

## 2023-05-16 DIAGNOSIS — H2513 Age-related nuclear cataract, bilateral: Secondary | ICD-10-CM | POA: Diagnosis not present

## 2023-05-20 ENCOUNTER — Encounter: Payer: Self-pay | Admitting: Internal Medicine

## 2023-05-20 ENCOUNTER — Ambulatory Visit (INDEPENDENT_AMBULATORY_CARE_PROVIDER_SITE_OTHER): Payer: PPO | Admitting: Internal Medicine

## 2023-05-20 VITALS — BP 134/82 | HR 62 | Temp 98.1°F | Ht 69.0 in | Wt 194.0 lb

## 2023-05-20 DIAGNOSIS — Z7985 Long-term (current) use of injectable non-insulin antidiabetic drugs: Secondary | ICD-10-CM | POA: Diagnosis not present

## 2023-05-20 DIAGNOSIS — E1165 Type 2 diabetes mellitus with hyperglycemia: Secondary | ICD-10-CM

## 2023-05-20 DIAGNOSIS — E538 Deficiency of other specified B group vitamins: Secondary | ICD-10-CM | POA: Diagnosis not present

## 2023-05-20 DIAGNOSIS — Z23 Encounter for immunization: Secondary | ICD-10-CM

## 2023-05-20 DIAGNOSIS — E782 Mixed hyperlipidemia: Secondary | ICD-10-CM | POA: Diagnosis not present

## 2023-05-20 DIAGNOSIS — E559 Vitamin D deficiency, unspecified: Secondary | ICD-10-CM

## 2023-05-20 DIAGNOSIS — I2510 Atherosclerotic heart disease of native coronary artery without angina pectoris: Secondary | ICD-10-CM | POA: Diagnosis not present

## 2023-05-20 DIAGNOSIS — Z7189 Other specified counseling: Secondary | ICD-10-CM | POA: Diagnosis not present

## 2023-05-20 DIAGNOSIS — Z125 Encounter for screening for malignant neoplasm of prostate: Secondary | ICD-10-CM | POA: Diagnosis not present

## 2023-05-20 DIAGNOSIS — F17200 Nicotine dependence, unspecified, uncomplicated: Secondary | ICD-10-CM

## 2023-05-20 DIAGNOSIS — I10 Essential (primary) hypertension: Secondary | ICD-10-CM

## 2023-05-20 NOTE — Patient Instructions (Signed)
You had the flu shot today, and the Prevnar 20 pneumonia shot  Your DNR form was filled out today  Please continue all other medications as before, and refills have been done if requested.  Please have the pharmacy call with any other refills you may need.  Please keep your appointments with your specialists as you may have planned - Echo tomorrow and cardiology soon, as well as the Oct 2024 repeat LDCT scanning  Please make an Appointment to return in 6 months, or sooner if needed, also with Lab Appointment for testing done 3-5 days before at the FIRST FLOOR Lab (so this is for TWO appointments - please see the scheduling desk as you leave)

## 2023-05-20 NOTE — Progress Notes (Unsigned)
Patient ID: Scott Valentine, male   DOB: [birth year], 67 y.o.   MRN: [id number]        Chief Complaint: follow up HTN, HLD and hyperglycemia , DNR discussion, abnormal LDCt screening       HPI:  Scott Valentine is a 67 y.o. male here overall doing ok; wants to be DNR after seeing family with low quality of life aftere resuscitations; due for flu and prevnar 20 today, Pt denies chest pain, increased sob or doe, wheezing, orthopnea, PND, increased LE swelling, palpitations, dizziness or syncope.   Pt denies polydipsia, polyuria, or new focal neuro s/s.    Pt denies fever, wt loss, night sweats, loss of appetite, or other constitutional symptoms  Has echo tomorrow and cardiology soon.  Lost wt with better diet and ozempic. Still smoking, not ready to quit  due for flu and prevnar 20 Wt Readings from Last 3 Encounters:  05/20/23 194 lb (88 kg)  05/07/23 198 lb (89.8 kg)  11/15/22 200 lb (90.7 kg)   BP Readings from Last 3 Encounters:  05/20/23 134/82  05/07/23 118/62  11/15/22 (!) 150/102         Past Medical History:  Diagnosis Date   Allergy    Anxiety state, unspecified 10/08/2007   Carpal tunnel syndrome    Cervical radiculopathy    numbness in finger tips after cervical surgery    Clotting disorder (HCC)    DVT - secondary vein left leg    Cough 11/08/2009   Diabetes mellitus without complication (HCC)    DVT (deep venous thrombosis) (HCC) 2020   right leg, dissolved itself   Dysmetabolic syndrome X 10/08/2007   GERD 05/23/2008   past hx - not current issue    GLUCOSE INTOLERANCE 10/19/2008   HYPERLIPIDEMIA 10/08/2007   HYPERPLASIA PROSTATE UNS W/O UR OBST & OTH LUTS 10/08/2007   Impaired glucose tolerance 12/30/2010   Other and unspecified hyperlipidemia 05/01/2007   Rectal bleeding    STEVENS-JOHNSON SYNDROME, HX OF 10/08/2007   TOBACCO USE, QUIT 10/08/2007   UNS ADVRS EFF UNS RX MEDICINAL&BIOLOGICAL SBSTNC 12/17/2007   Unspecified essential hypertension 05/01/2007   BP increases with  being upset    URI 10/29/2007   Past Surgical History:  Procedure Laterality Date   ANAL FISSURE REPAIR  04/25/11   COLONOSCOPY     DIGIT NAIL REMOVAL Right 08/18/2019   Procedure: REMOVAL OF RIGHT THUMB INGROWN NAIL;  Surgeon: Tarry Kos, MD;  Location: [facility];  Service: Orthopedics;  Laterality: Right;   finger surgury' Left 05/30/2008   3rd finger   HEMORRHOID SURGERY  04/25/11   POLYPECTOMY     POSTERIOR CERVICAL LAMINECTOMY Right 10/21/2018   Procedure: Right Cervical five-six Cervical six-seven Posterior cervical foraminotomies;  Surgeon: Jadene Pierini, MD;  Location: [facility];  Service: Neurosurgery;  Laterality: Right;    reports that he has been smoking cigarettes. He has a 48 pack-year smoking history. He has quit using smokeless tobacco. He reports that he does not drink alcohol and does not use drugs. family history includes Coronary artery disease (age of onset: 61) in his father; Diabetes in his mother; Heart attack in his paternal grandfather; Heart disease in his brother and father; Lung cancer in his father; Mental illness in his mother; Prostate cancer in his paternal grandfather. Allergies  Allergen Reactions   Sulfonamide Derivatives Other (See Comments)    stevens johnson   Atorvastatin Other (See Comments)    myalgias  Ezetimibe Other (See Comments)    myalgias   Current Outpatient Medications on File Prior to Visit  Medication Sig Dispense Refill   aspirin EC 81 MG tablet Take 81 mg by mouth daily.     clonazePAM (KLONOPIN) 0.5 MG tablet Take 1 tablet by mouth twice daily as needed for anxiety 60 tablet 2   empagliflozin (JARDIANCE) 25 MG TABS tablet Take 1 tablet by mouth once daily 90 tablet 2   escitalopram (LEXAPRO) 20 MG tablet Take 1 tablet by mouth once daily 90 tablet 3   fexofenadine (ALLEGRA) 180 MG tablet Take 1 tablet (180 mg total) by mouth daily as needed. 90 tablet 3   folic acid (FOLVITE) 1 MG tablet Take 1 tablet (1 mg  total) by mouth daily. 90 tablet 3   losartan (COZAAR) 100 MG tablet Take 1 tablet by mouth once daily 90 tablet 2   magnesium oxide (MAG-OX) 400 MG tablet Take 400 mg by mouth daily.     metFORMIN (GLUCOPHAGE-XR) 500 MG 24 hr tablet TAKE 4 TABLETS BY MOUTH ONCE DAILY WITH BREAKFAST 360 tablet 3   metoprolol tartrate (LOPRESSOR) 25 MG tablet Take 1/2 (one-half) tablet by mouth once daily 45 tablet 2   Multiple Vitamin (MULTIVITAMIN) tablet Take 1 tablet by mouth daily.     pioglitazone (ACTOS) 15 MG tablet Take 1 tablet by mouth once daily 90 tablet 3   rosuvastatin (CRESTOR) 40 MG tablet Take 1 tablet by mouth once daily 90 tablet 2   Semaglutide, 2 MG/DOSE, 8 MG/3ML SOPN Inject 2 mg as directed once a week. 9 mL 3   sildenafil (VIAGRA) 100 MG tablet Take 1 tablet (100 mg total) by mouth daily as needed for erectile dysfunction. 10 tablet 11   vitamin C (ASCORBIC ACID) 500 MG tablet Take 500 mg by mouth daily.     No current facility-administered medications on file prior to visit.        ROS:  All others reviewed and negative.  Objective        PE:  BP 134/82 (BP Location: Right Arm, Patient Position: Sitting, Cuff Size: Normal)   Pulse 62   Temp 98.1 F (36.7 C) (Oral)   Ht 5\' 9"  (1.753 m)   Wt 194 lb (88 kg)   SpO2 97%   BMI 28.65 kg/m                 Constitutional: Pt appears in NAD               HENT: Head: NCAT.                Right Ear: External ear normal.                 Left Ear: External ear normal.                Eyes: . Pupils are equal, round, and reactive to light. Conjunctivae and EOM are normal               Nose: without d/c or deformity               Neck: Neck supple. Gross normal ROM               Cardiovascular: Normal rate and regular rhythm.                 Pulmonary/Chest: Effort normal and breath sounds without rales or wheezing.  Abd:  Soft, NT, ND, + BS, no organomegaly               Neurological: Pt is alert. At baseline orientation,  motor grossly intact               Skin: Skin is warm. No rashes, no other new lesions, LE edema - none               Psychiatric: Pt behavior is normal without agitation   Micro: none  Cardiac tracings I have personally interpreted today:  none  Pertinent Radiological findings (summarize): none   Lab Results  Component Value Date   WBC 7.3 11/08/2022   HGB 15.7 11/08/2022   HCT 46.1 11/08/2022   PLT 201.0 11/08/2022   GLUCOSE 113 (H) 05/13/2023   CHOL 99 05/13/2023   TRIG 89.0 05/13/2023   HDL 39.90 05/13/2023   LDLDIRECT 88.0 05/02/2021   LDLCALC 42 05/13/2023   ALT 23 05/13/2023   AST 23 05/13/2023   NA 137 05/13/2023   K 4.3 05/13/2023   CL 104 05/13/2023   CREATININE 0.89 05/13/2023   BUN 15 05/13/2023   CO2 26 05/13/2023   TSH 2.92 11/08/2022   PSA 1.14 11/08/2022   HGBA1C 6.6 (H) 05/13/2023   MICROALBUR 0.7 11/08/2022   Assessment/Plan:  AARIC DODGSON is a 67 y.o. White or Caucasian [1] male with  has a past medical history of Allergy, Anxiety state, unspecified (10/08/2007), Carpal tunnel syndrome, Cervical radiculopathy, Clotting disorder (HCC), Cough (11/08/2009), Diabetes mellitus without complication (HCC), DVT (deep venous thrombosis) (HCC) (2020), Dysmetabolic syndrome X (10/08/2007), GERD (05/23/2008), GLUCOSE INTOLERANCE (10/19/2008), HYPERLIPIDEMIA (10/08/2007), HYPERPLASIA PROSTATE UNS W/O UR OBST & OTH LUTS (10/08/2007), Impaired glucose tolerance (12/30/2010), Other and unspecified hyperlipidemia (05/01/2007), Rectal bleeding, STEVENS-JOHNSON SYNDROME, HX OF (10/08/2007), TOBACCO USE, QUIT (10/08/2007), UNS ADVRS EFF UNS RX MEDICINAL&BIOLOGICAL SBSTNC (12/17/2007), Unspecified essential hypertension (05/01/2007), and URI (10/29/2007).  Coronary artery calcification seen on CT scan Also for echo and cardiology f/u as planned   Diabetes Lab Results  Component Value Date   HGBA1C 6.6 (H) 05/13/2023   Stable, pt to continue current medical treatment jardiance 25 every  day, metformin ER 500 mg - 4 every day, actos 15 every day, ozempic 2 mg weekly   DNR (do not resuscitate) discussion Pt adamant - ok for DNR form signed  Essential hypertension BP Readings from Last 3 Encounters:  05/20/23 134/82  05/07/23 118/62  11/15/22 (!) 150/102   Stable, pt to continue medical treatment losartan 100 every day, lopressor 12.5 bid   HYPERLIPIDEMIA Lab Results  Component Value Date   LDLCALC 42 05/13/2023   Stable, pt to continue current statin crestor 40 qd   Smoker Pt counsled to quit, pt not ready; had recent LDCT ? Abnormal, pt encouraged to f/u with repeat LDCT as scheduled despite the copay involved with repeat testing  Followup: No follow-ups on file.  Oliver Barre, MD 05/21/2023 9:08 PM [facility]

## 2023-05-21 ENCOUNTER — Ambulatory Visit (INDEPENDENT_AMBULATORY_CARE_PROVIDER_SITE_OTHER): Payer: PPO

## 2023-05-21 DIAGNOSIS — R011 Cardiac murmur, unspecified: Secondary | ICD-10-CM

## 2023-05-21 DIAGNOSIS — Z7189 Other specified counseling: Secondary | ICD-10-CM | POA: Insufficient documentation

## 2023-05-21 DIAGNOSIS — R931 Abnormal findings on diagnostic imaging of heart and coronary circulation: Secondary | ICD-10-CM | POA: Diagnosis not present

## 2023-05-21 LAB — ECHOCARDIOGRAM COMPLETE
AR max vel: 1.05 cm2
AV Area VTI: 0.99 cm2
AV Mean grad: 21 mmHg
AV Peak grad: 39.4 mmHg
Ao pk vel: 3.14 m/s
Area-P 1/2: 3.72 cm2
S' Lateral: 3.9 cm

## 2023-05-21 NOTE — Assessment & Plan Note (Signed)
Also for echo and cardiology f/u as planned

## 2023-05-21 NOTE — Assessment & Plan Note (Signed)
Pt counsled to quit, pt not ready; had recent LDCT ? Abnormal, pt encouraged to f/u with repeat LDCT as scheduled despite the copay involved with repeat testing

## 2023-05-21 NOTE — Assessment & Plan Note (Signed)
Pt adamant - ok for DNR form signed

## 2023-05-21 NOTE — Assessment & Plan Note (Signed)
Lab Results  Component Value Date   HGBA1C 6.6 (H) 05/13/2023   Stable, pt to continue current medical treatment jardiance 25 every day, metformin ER 500 mg - 4 every day, actos 15 every day, ozempic 2 mg weekly

## 2023-05-21 NOTE — Assessment & Plan Note (Signed)
BP Readings from Last 3 Encounters:  05/20/23 134/82  05/07/23 118/62  11/15/22 (!) 150/102   Stable, pt to continue medical treatment losartan 100 every day, lopressor 12.5 bid

## 2023-05-21 NOTE — Assessment & Plan Note (Signed)
Lab Results  Component Value Date   LDLCALC 42 05/13/2023   Stable, pt to continue current statin crestor 40 qd

## 2023-05-22 ENCOUNTER — Encounter (HOSPITAL_COMMUNITY): Payer: Self-pay

## 2023-05-25 ENCOUNTER — Other Ambulatory Visit: Payer: Self-pay | Admitting: Internal Medicine

## 2023-05-26 ENCOUNTER — Other Ambulatory Visit: Payer: Self-pay

## 2023-05-27 ENCOUNTER — Other Ambulatory Visit: Payer: Self-pay | Admitting: Internal Medicine

## 2023-05-28 ENCOUNTER — Ambulatory Visit (HOSPITAL_COMMUNITY): Payer: PPO | Attending: Cardiovascular Disease

## 2023-05-28 DIAGNOSIS — Z87891 Personal history of nicotine dependence: Secondary | ICD-10-CM | POA: Diagnosis not present

## 2023-05-28 DIAGNOSIS — Z7984 Long term (current) use of oral hypoglycemic drugs: Secondary | ICD-10-CM | POA: Diagnosis not present

## 2023-05-28 DIAGNOSIS — Z136 Encounter for screening for cardiovascular disorders: Secondary | ICD-10-CM | POA: Insufficient documentation

## 2023-05-28 DIAGNOSIS — R931 Abnormal findings on diagnostic imaging of heart and coronary circulation: Secondary | ICD-10-CM | POA: Insufficient documentation

## 2023-05-28 DIAGNOSIS — E119 Type 2 diabetes mellitus without complications: Secondary | ICD-10-CM | POA: Insufficient documentation

## 2023-05-28 DIAGNOSIS — Z79899 Other long term (current) drug therapy: Secondary | ICD-10-CM | POA: Diagnosis not present

## 2023-05-28 DIAGNOSIS — I10 Essential (primary) hypertension: Secondary | ICD-10-CM | POA: Insufficient documentation

## 2023-05-28 DIAGNOSIS — Z7982 Long term (current) use of aspirin: Secondary | ICD-10-CM | POA: Diagnosis not present

## 2023-05-28 LAB — MYOCARDIAL PERFUSION IMAGING
LV dias vol: 97 mL (ref 62–150)
LV sys vol: 43 mL
Nuc Stress EF: 55 %
Peak HR: 85 {beats}/min
Rest HR: 57 {beats}/min
Rest Nuclear Isotope Dose: 10.9 mCi
SDS: 0
SRS: 0
SSS: 0
ST Depression (mm): 0 mm
Stress Nuclear Isotope Dose: 32.7 mCi
TID: 1.09

## 2023-05-28 MED ORDER — TECHNETIUM TC 99M TETROFOSMIN IV KIT
32.7000 | PACK | Freq: Once | INTRAVENOUS | Status: AC | PRN
  Administered 2023-05-28: 32.7 via INTRAVENOUS

## 2023-05-28 MED ORDER — TECHNETIUM TC 99M TETROFOSMIN IV KIT
10.9000 | PACK | Freq: Once | INTRAVENOUS | Status: AC | PRN
  Administered 2023-05-28: 10.9 via INTRAVENOUS

## 2023-05-28 MED ORDER — REGADENOSON 0.4 MG/5ML IV SOLN
0.4000 mg | Freq: Once | INTRAVENOUS | Status: AC
  Administered 2023-05-28: 0.4 mg via INTRAVENOUS

## 2023-06-04 ENCOUNTER — Telehealth: Payer: Self-pay

## 2023-06-04 NOTE — Telephone Encounter (Signed)
Called and left voicemail letting Pt know his Ozempic has arrived and is ready for pickup. Ozempic has been placed in the fridge at the nurse's station.

## 2023-07-07 ENCOUNTER — Ambulatory Visit (HOSPITAL_BASED_OUTPATIENT_CLINIC_OR_DEPARTMENT_OTHER)
Admission: RE | Admit: 2023-07-07 | Discharge: 2023-07-07 | Disposition: A | Discharge status: Home / Self Care | Payer: PPO | Source: Ambulatory Visit | Attending: Acute Care | Admitting: Acute Care

## 2023-07-07 DIAGNOSIS — R911 Solitary pulmonary nodule: Secondary | ICD-10-CM | POA: Diagnosis not present

## 2023-07-07 DIAGNOSIS — Z87891 Personal history of nicotine dependence: Secondary | ICD-10-CM | POA: Insufficient documentation

## 2023-07-07 DIAGNOSIS — I700 Atherosclerosis of aorta: Secondary | ICD-10-CM | POA: Diagnosis not present

## 2023-07-07 DIAGNOSIS — J432 Centrilobular emphysema: Secondary | ICD-10-CM | POA: Diagnosis not present

## 2023-07-10 ENCOUNTER — Other Ambulatory Visit: Payer: Self-pay | Admitting: Internal Medicine

## 2023-07-11 ENCOUNTER — Other Ambulatory Visit: Payer: Self-pay

## 2023-07-17 DIAGNOSIS — L84 Corns and callosities: Secondary | ICD-10-CM | POA: Diagnosis not present

## 2023-07-17 DIAGNOSIS — E1142 Type 2 diabetes mellitus with diabetic polyneuropathy: Secondary | ICD-10-CM | POA: Diagnosis not present

## 2023-07-17 DIAGNOSIS — B351 Tinea unguium: Secondary | ICD-10-CM | POA: Diagnosis not present

## 2023-07-17 DIAGNOSIS — M79676 Pain in unspecified toe(s): Secondary | ICD-10-CM | POA: Diagnosis not present

## 2023-08-02 ENCOUNTER — Other Ambulatory Visit: Payer: Self-pay | Admitting: Internal Medicine

## 2023-08-04 ENCOUNTER — Other Ambulatory Visit: Payer: Self-pay

## 2023-08-05 NOTE — Progress Notes (Unsigned)
Cardiology Office Note:  .   Date:  08/06/2023 ID:  Scott Valentine, DOB 08-Feb-1956, MRN [id number] PCP: Corwin Levins, MD [facility] Providers Cardiologist:  None   Patient Profile: .      PMH CAD CT calcium score 04/10/2023 with CAC 2614 (98th percentile) Aortic atherosclerosis Moderate aortic valve calcifications (AV calcium score 1114) Tobacco abuse 48 year pack history Hypertension Hyperlipidemia Type 2 DM Anxiety DVT in left leg Took aspirin, was told clot was superficial    Seen by me on 05/07/23 as a new patient for elevated coronary calcium score on CT. He is accompanied by his wife. His understanding was that he was being enrolled in a lung cancer program and after initial testing, was told there was a concern for something in his heart. 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 Since retirement 3 years ago, he remains active at home with regular yard work and home projects. He does his own Lobbyist work, Pension scheme manager, and building. He does not participate in sustained exercise. Activity somewhat limited by calouses on bottom of feet, gets them trimmed by podiatrist and by neck pain from previous cervical spine surgery. Prior stress test many years ago and at stage 2 he was put on a stretcher and told there was concern for stroke due to his BP being so high.  He was started on low-dose Lopressor and since that time reports no major issues with BP. Was placed on Ozempic by PCP and has achieved 35 lb weight loss.  He occasionally gets fatigued after working several  hours in the heat but it sounds more like body fatigue than dyspnea. No chest pain, palpitations, orthopnea, PND, presyncope, syncope.  His wife reports he coughs a lot, usually worse in the mornings. He has smoked as much as 3 ppd in the past. Tried to quit smoking for his family, reports no one could stand to be around him and he did not want to talk to anyone.  Family  history is significant for heart disease with CABG x 4 in his mother who died in her 54s, and MI x 2 in his father.  His ASCVD risk score was elevated at 23.9%.  He reported eating less since being on Ozempic.  Admitted he does not avoid any certain types of foods.  Eats most meals at home but typically does not make healthy choices.  He cooks with margarine.  Activity is limited to house and yard work.    History of Present Illness: .   Scott Valentine is a very pleasant  67 y.o. male  who is here today for follow-up and is accompanied by his wife. He expresses concern about not receiving results from a recent stress test and lung scan. We reviewed the stress test in detail. The lung CT 07/07/23 has not been resulted. He would like to reduce the frequency of this test. He reports he is feeling well and has been trying to improve his diet by eating more vegetables and less saturated fat. Would like to get appropriate footwear to alleviate discomfort caused by foot calluses so that he can walk more frequently. He continues to smoke.  He denies any concerning cardiac symptoms including chest pain, shortness of breath, palpitations, orthopnea, PND, edema, presyncope, or syncope.  ROS: See HPI       Studies Reviewed: .       Risk Assessment/Calculations:  Physical Exam:    Today's Vitals   08/06/23 0812  BP: 106/64  Pulse: 61  SpO2: 98%  Weight: 196 lb 3.2 oz (89 kg)  Height: 5\' 9"  (1.753 m)    Body mass index is 28.97 kg/m.  Wt Readings from Last 3 Encounters:  04/15/23 159 lb 12.8 oz (72.5 kg)  02/05/23 163 lb (73.9 kg)    GEN: Well nourished, well developed in no acute distress NECK: No JVD; No carotid bruits CARDIAC: RRR, systolic murmur heard throughout chest, no rubs, gallops RESPIRATORY:  Clear to auscultation without rales, wheezing or rhonchi  ABDOMEN: Soft, non-tender, non-distended EXTREMITIES:  No edema; No deformity     ASSESSMENT AND PLAN: .    CAD without  angina: Coronary artery calcium score 2614 (98th percentile) and moderate aortic valve calcifications on CT 04/04/2023.  Lexiscan Myoview was completed 05/28/23 to evaluate for ischemia and was low risk with no evidence of ischemia or infarction, normal EF.  He continues to be asymptomatic.  He is working on getting appropriate footwear so that he can walk with his wife for exercise. I have asked him to notify us if he develops concerning cardiac symptoms. Focus on secondary prevention including heart healthy mostly plant based diet avoiding saturated fat, processed foods, simple carbohydrates, and sugar along with aiming for at least 150 minutes of moderate intensity exercise each week. No bleeding concerns. Continue aspirin, losartan, metoprolol, rosuvastatin.  Hyperlipidemia LDL goal < 70: Lipid panel completed 05/13/2023 with total cholesterol 99, triglycerides 89, HDL 39.9, and LDL 42, very well-controlled.  Continue rosuvastatin.  Aortic stenosis: Systolic murmur was auscultated at time of initial visit.  Echocardiogram 05/21/23 revealed moderate calcification of the aortic valve with moderate aortic stenosis, mean gradient 21 mmHg, AVA 0.99 cm. He has mildly reduced LVEF 50-55%, grade 2 diastolic dysfunction, normal RV. He is asymptomatic.  I reviewed symptoms for him to be aware of and advised him to notify us if he has concerns.  We will repeat echo September 2025 and see him for follow-up soon afterwards.  LV dysfunction: He has mildly reduced LVEF 50 to 55% and grade 2 diastolic dysfunction on echo 05/21/2023. He denies shortness of breath, edema, orthopnea, PND. He appears euvolemic on exam. We will repeat echo in 1 year for surveillance of aortic valve stenosis. BP is well controlled. Advised him to notify with concerning symptoms prior to that time.   Hypertension: BP is well controlled.  Renal function is stable on labs completed 05/13/2023.  No medication changes today.  Tobacco dependence: He  continues to smoke with no motivation to quit at this time. Complete cessation advised.         Dispo: 1 year with Dr. Cristal Deer or me  Signed, Eligha Bridegroom, NP-C

## 2023-08-06 ENCOUNTER — Ambulatory Visit (HOSPITAL_BASED_OUTPATIENT_CLINIC_OR_DEPARTMENT_OTHER): Payer: PPO | Admitting: Nurse Practitioner

## 2023-08-06 ENCOUNTER — Encounter (HOSPITAL_BASED_OUTPATIENT_CLINIC_OR_DEPARTMENT_OTHER): Payer: Self-pay | Admitting: Nurse Practitioner

## 2023-08-06 VITALS — BP 106/64 | HR 61 | Ht 69.0 in | Wt 196.2 lb

## 2023-08-06 DIAGNOSIS — E785 Hyperlipidemia, unspecified: Secondary | ICD-10-CM

## 2023-08-06 DIAGNOSIS — I5189 Other ill-defined heart diseases: Secondary | ICD-10-CM | POA: Diagnosis not present

## 2023-08-06 DIAGNOSIS — I350 Nonrheumatic aortic (valve) stenosis: Secondary | ICD-10-CM

## 2023-08-06 DIAGNOSIS — I10 Essential (primary) hypertension: Secondary | ICD-10-CM | POA: Diagnosis not present

## 2023-08-06 DIAGNOSIS — F17200 Nicotine dependence, unspecified, uncomplicated: Secondary | ICD-10-CM

## 2023-08-06 DIAGNOSIS — I2510 Atherosclerotic heart disease of native coronary artery without angina pectoris: Secondary | ICD-10-CM

## 2023-08-06 NOTE — Patient Instructions (Signed)
Medication Instructions:  Your physician recommends that you continue on your current medications as directed. Please refer to the Current Medication list given to you today.   *If you need a refill on your cardiac medications before your next appointment, please call your pharmacy*  Lab Work: NONE  Testing/Procedures: Your physician has requested that you have an echocardiogram. Echocardiography is a painless test that uses sound waves to create images of your heart. It provides your doctor with information about the size and shape of your heart and how well your heart's chambers and valves are working. This procedure takes approximately one hour. There are no restrictions for this procedure. Please do NOT wear cologne, perfume, aftershave, or lotions (deodorant is allowed). Please arrive 15 minutes prior to your appointment time.  Please note: We ask at that you not bring children with you during ultrasound (echo/ vascular) testing. Due to room size and safety concerns, children are not allowed in the ultrasound rooms during exams. Our front office staff cannot provide observation of children in our lobby area while testing is being conducted. An adult accompanying a patient to their appointment will only be allowed in the ultrasound room at the discretion of the ultrasound technician under special circumstances. We apologize for any inconvenience.  TO BE DONE IN SEPTEMBER 2025  Follow-Up: At [facility], you and your health needs are our priority.  As part of our continuing mission to provide you with exceptional heart care, we have created designated Provider Care Teams.  These Care Teams include your primary Cardiologist (physician) and Advanced Practice Providers (APPs -  Physician Assistants and Nurse Practitioners) who all work together to provide you with the care you need, when you need it.  We recommend signing up for the patient portal called "MyChart".  Sign up information  is provided on this After Visit Summary.  MyChart is used to connect with patients for Virtual Visits (Telemedicine).  Patients are able to view lab/test results, encounter notes, upcoming appointments, etc.  Non-urgent messages can be sent to your provider as well.   To learn more about what you can do with MyChart, go to [email or web address].    Your next appointment:   12 month(s)  Provider:   Jodelle Red, MD or Eligha Bridegroom, NP

## 2023-08-11 ENCOUNTER — Telehealth: Payer: Self-pay | Admitting: Acute Care

## 2023-08-11 NOTE — Telephone Encounter (Signed)
Call Report  

## 2023-08-23 ENCOUNTER — Other Ambulatory Visit: Payer: Self-pay | Admitting: Internal Medicine

## 2023-08-25 ENCOUNTER — Other Ambulatory Visit: Payer: Self-pay

## 2023-08-28 ENCOUNTER — Telehealth: Payer: Self-pay | Admitting: Acute Care

## 2023-08-28 DIAGNOSIS — Z87891 Personal history of nicotine dependence: Secondary | ICD-10-CM

## 2023-08-28 DIAGNOSIS — Z122 Encounter for screening for malignant neoplasm of respiratory organs: Secondary | ICD-10-CM

## 2023-08-28 DIAGNOSIS — F17210 Nicotine dependence, cigarettes, uncomplicated: Secondary | ICD-10-CM

## 2023-08-28 NOTE — Telephone Encounter (Signed)
Left VM to call for results of LDCT.  Letter also sent to mychart. To call. Kandice Robinsons NP reviewed the results and recommends the following:   LDCT follow up is now Rads0 (previously 4B).  Patient is poor candidate for LCS and results show chronic infection or aspiration.  Repeat LCS LDCT in 3 months but recommends pulmonary referral in [place] or [place] to follow up after 3 months CT has been completed.  Sarah did message Dr. Sherene Sires about the patient in case the [place] office is preferable for patient.

## 2023-08-29 NOTE — Telephone Encounter (Signed)
Called and left Vm for pt.

## 2023-09-04 NOTE — Telephone Encounter (Signed)
Called three different numbers in pts chart, left VM on all three. Will wait to see if pt calls back.

## 2023-09-04 NOTE — Telephone Encounter (Signed)
Called and spoke with patient regarding CT results. I advised pt that the recommendations are to repeat The CT in 3 months and then to see pulmonary Dr after the CT. Pt reports that he suffers from chronic Hay fever symptoms and feels like this is what we are seeing. Pt wants to wait to repeat CT until 06/2024 and then see specialist after that if still needed. CT results sent to PCP and advised of pt's desires to repeat CT in 1 year instead of 3 months. Order placed for 12 mth follow up LDCT per pt request.

## 2023-09-04 NOTE — Addendum Note (Signed)
Addended by: Abigail Miyamoto D on: 09/04/2023 03:56 PM   Modules accepted: Orders

## 2023-09-05 ENCOUNTER — Other Ambulatory Visit: Payer: Self-pay | Admitting: Internal Medicine

## 2023-09-09 ENCOUNTER — Other Ambulatory Visit: Payer: Self-pay | Admitting: Internal Medicine

## 2023-09-17 ENCOUNTER — Emergency Department (HOSPITAL_COMMUNITY): Payer: PPO

## 2023-09-17 ENCOUNTER — Other Ambulatory Visit: Payer: Self-pay

## 2023-09-17 ENCOUNTER — Emergency Department (HOSPITAL_COMMUNITY)
Admission: EM | Admit: 2023-09-17 | Discharge: 2023-09-17 | Disposition: A | Discharge status: Home / Self Care | Payer: PPO

## 2023-09-17 ENCOUNTER — Ambulatory Visit: Payer: PPO | Admitting: Nurse Practitioner

## 2023-09-17 ENCOUNTER — Ambulatory Visit: Payer: Self-pay | Admitting: Internal Medicine

## 2023-09-17 VITALS — BP 118/72 | HR 68 | Temp 97.6°F | Ht 69.0 in | Wt 203.2 lb

## 2023-09-17 DIAGNOSIS — R109 Unspecified abdominal pain: Secondary | ICD-10-CM | POA: Diagnosis not present

## 2023-09-17 DIAGNOSIS — Z7982 Long term (current) use of aspirin: Secondary | ICD-10-CM | POA: Diagnosis not present

## 2023-09-17 DIAGNOSIS — E119 Type 2 diabetes mellitus without complications: Secondary | ICD-10-CM | POA: Insufficient documentation

## 2023-09-17 DIAGNOSIS — K429 Umbilical hernia without obstruction or gangrene: Secondary | ICD-10-CM | POA: Diagnosis not present

## 2023-09-17 DIAGNOSIS — Z79899 Other long term (current) drug therapy: Secondary | ICD-10-CM | POA: Diagnosis not present

## 2023-09-17 DIAGNOSIS — K5730 Diverticulosis of large intestine without perforation or abscess without bleeding: Secondary | ICD-10-CM | POA: Diagnosis not present

## 2023-09-17 DIAGNOSIS — Z7984 Long term (current) use of oral hypoglycemic drugs: Secondary | ICD-10-CM | POA: Diagnosis not present

## 2023-09-17 DIAGNOSIS — I10 Essential (primary) hypertension: Secondary | ICD-10-CM | POA: Insufficient documentation

## 2023-09-17 DIAGNOSIS — K8020 Calculus of gallbladder without cholecystitis without obstruction: Secondary | ICD-10-CM | POA: Diagnosis not present

## 2023-09-17 LAB — URINALYSIS, ROUTINE W REFLEX MICROSCOPIC
Bacteria, UA: NONE SEEN
Bilirubin Urine: NEGATIVE
Glucose, UA: >=500 mg/dL — AB
Hgb urine dipstick: NEGATIVE
Ketones, ur: NEGATIVE mg/dL
Leukocytes,Ua: NEGATIVE
Nitrite: NEGATIVE
Protein, ur: NEGATIVE mg/dL
Specific Gravity, Urine: 1.006 (ref 1.005–1.030)
pH: 6 (ref 5.0–8.0)

## 2023-09-17 LAB — CBC WITH DIFFERENTIAL/PLATELET
Abs Immature Granulocytes: 0.02 10*3/uL (ref 0.00–0.07)
Basophils Absolute: 0.1 10*3/uL (ref 0.0–0.1)
Basophils Relative: 1 %
Eosinophils Absolute: 0.2 10*3/uL (ref 0.0–0.5)
Eosinophils Relative: 3 %
HCT: 43.5 % (ref 39.0–52.0)
Hemoglobin: 14.6 g/dL (ref 13.0–17.0)
Immature Granulocytes: 0 %
Lymphocytes Relative: 18 %
Lymphs Abs: 1.3 10*3/uL (ref 0.7–4.0)
MCH: 31.3 pg (ref 26.0–34.0)
MCHC: 33.6 g/dL (ref 30.0–36.0)
MCV: 93.1 fL (ref 80.0–100.0)
Monocytes Absolute: 0.6 10*3/uL (ref 0.1–1.0)
Monocytes Relative: 8 %
Neutro Abs: 5.3 10*3/uL (ref 1.7–7.7)
Neutrophils Relative %: 70 %
Platelets: 172 10*3/uL (ref 150–400)
RBC: 4.67 MIL/uL (ref 4.22–5.81)
RDW: 13.1 % (ref 11.5–15.5)
WBC: 7.4 10*3/uL (ref 4.0–10.5)
nRBC: 0 % (ref 0.0–0.2)

## 2023-09-17 LAB — COMPREHENSIVE METABOLIC PANEL
ALT: 22 U/L (ref 0–44)
AST: 25 U/L (ref 15–41)
Albumin: 3.8 g/dL (ref 3.5–5.0)
Alkaline Phosphatase: 46 U/L (ref 38–126)
Anion gap: 11 (ref 5–15)
BUN: 8 mg/dL (ref 8–23)
CO2: 24 mmol/L (ref 22–32)
Calcium: 9.2 mg/dL (ref 8.9–10.3)
Chloride: 103 mmol/L (ref 98–111)
Creatinine, Ser: 0.74 mg/dL (ref 0.61–1.24)
GFR, Estimated: >60 mL/min (ref >60)
Glucose, Bld: 90 mg/dL (ref 70–99)
Potassium: 3.7 mmol/L (ref 3.5–5.1)
Sodium: 138 mmol/L (ref 135–145)
Total Bilirubin: 0.6 mg/dL (ref 0.0–1.2)
Total Protein: 7 g/dL (ref 6.5–8.1)

## 2023-09-17 LAB — LIPASE, BLOOD: Lipase: 88 U/L — ABNORMAL HIGH (ref 11–51)

## 2023-09-17 MED ORDER — IOHEXOL 350 MG/ML SOLN
75.0000 mL | Freq: Once | INTRAVENOUS | Status: AC | PRN
  Administered 2023-09-17: 75 mL via INTRAVENOUS

## 2023-09-17 NOTE — Telephone Encounter (Signed)
 Copied from CRM [id number]. Topic: Clinical - Pink Word Triage >> Sep 17, 2023  8:07 AM [facility] ORN wrote: Reason for Triage: Hernia (Located On His Stomach, By His Belly Button) - Red At [employer], Sore  Chief Complaint: 6/10 pain/ swelling naval area due to 2 hernias Symptoms: pain 6/10 Red, sore, swelling, - pt states pain increase with movement  Frequency: 1 week Pertinent Negatives: Patient denies drainage Disposition: [] ED /[] Urgent Care (no appt availability in office) / [x] Appointment(In office/virtual)/ []  [place] [facility]/ [] Home Care/ [] Refused Recommended Disposition /[] [place] [facility]/ []  Follow-up with PCP Additional Notes: pt states he is afraid hernias are going to burst due to redness, swelling and pain continues to increase.  In office appt made for today with provider w/n PCP office.  Reason for Disposition  [1] MILD-MODERATE pain AND [2] constant AND [3] present > 2 hours  Answer Assessment - Initial Assessment Questions 1. LOCATION: Where does it hurt?      Naval area -  2 hernia  2. RADIATION: Does the pain shoot anywhere else? (e.g., chest, back)     N/a 3. ONSET: When did the pain begin? (Minutes, hours or days ago)      X 1 week 4. SUDDEN: Gradual or sudden onset?     gradual 5. PATTERN Does the pain come and go, or is it constant?    - If it comes and goes: How long does it last? Do you have pain now?     (Note: Comes and goes means the pain is intermittent. It goes away completely between bouts.)    - If constant: Is it getting better, staying the same, or getting worse?      (Note: Constant means the pain never goes away completely; most serious pain is constant and gets worse.)      constant 6. SEVERITY: How bad is the pain?  (e.g., Scale 1-10; mild, moderate, or severe)    - MILD (1-3): Doesn't interfere with normal activities, abdomen soft and not tender to touch.     - MODERATE (4-7): Interferes with normal activities or  awakens from sleep, abdomen tender to touch.     - SEVERE (8-10): Excruciating pain, doubled over, unable to do any normal activities.       If touch or move pain 3 to 6/10 and while sitting more 3/10 7. RECURRENT SYMPTOM: Have you ever had this type of stomach pain before? If Yes, ask: When was the last time? and What happened that time?      Pain getting worse and swelling  8. CAUSE: What do you think is causing the stomach pain?     Hernia in naval area 9. RELIEVING/AGGRAVATING FACTORS: What makes it better or worse? (e.g., antacids, bending or twisting motion, bowel movement)     Movement makes this worse 10. OTHER SYMPTOMS: Do you have any other symptoms? (e.g., back pain, diarrhea, fever, urination pain, vomiting)       Red, sore, swelling  Protocols used: Abdominal Pain - Male-A-AH

## 2023-09-17 NOTE — Progress Notes (Signed)
   Established Patient Office Visit  Subjective   Patient ID: DEION SWIFT, male    DOB: April 17, 1956  Age: 68 y.o. MRN: [id number]  Chief Complaint  Patient presents with   Hernia   Patient has today for acute visit for pain in his umbilical hernia.  Reports discomfort has been going on for about 10 days.  Yesterday noticed the hernia itself is becoming red.   He denies any nausea or vomiting, also reports no constipation and he is able to pass gas.  He is concerned about possible strangulation.  He also has what appears to be a ventral hernia that is not causing him discomfort at this time.  No fever or chills.     ROS: see HPI    Objective:     BP 118/72   Pulse 68   Temp 97.6 F (36.4 C) (Temporal)   Ht 5' 9 (1.753 m)   Wt 203 lb 4 oz (92.2 kg)   SpO2 99%   BMI 30.01 kg/m    Physical Exam Vitals reviewed.  Constitutional:      Appearance: Normal appearance.  HENT:     Head: Normocephalic and atraumatic.  Cardiovascular:     Rate and Rhythm: Normal rate and regular rhythm.  Pulmonary:     Effort: Pulmonary effort is normal.     Breath sounds: Normal breath sounds.  Abdominal:     General: Abdomen is flat.     Palpations: Abdomen is soft.     Hernia: A hernia is present. Hernia is present in the umbilical area and ventral area.    Musculoskeletal:     Cervical back: Neck supple.  Skin:    General: Skin is warm and dry.  Neurological:     Mental Status: He is alert and oriented to person, place, and time.  Psychiatric:        Mood and Affect: Mood normal.        Behavior: Behavior normal.        Thought Content: Thought content normal.        Judgment: Judgment normal.      No results found for any visits on 09/17/23.    The ASCVD Risk score (Arnett DK, et al., 2019) failed to calculate for the following reasons:   The valid total cholesterol range is 130 to 320 mg/dL    Assessment & Plan:   Problem List Items Addressed This Visit        Other   Umbilical hernia - Primary   Chronic, with acute discomfort Supervising physician was asked to evaluate the patient as well and so she did come in to assess patient today with me.  Because hernia is painful and not reducible there is concerned that it is acutely strangulated.  Recommendation was for patient to proceed to the emergency department for emergent evaluation and possible surgical treatment.  Patient is agreeable, he was offered EMS transport but declined and reports he and his wife can transport him to the ER themselves.       Return if symptoms worsen or fail to improve.    Lauraine FORBES Pereyra, NP

## 2023-09-17 NOTE — Discharge Instructions (Signed)
 Your workup today was reassuring.  Your CT scan shows a fat-containing hernia.  There are no signs of strangulation or incarceration of your bowels today.  Please follow-up with the surgeon at the number provided.  Return to the emergency department if you develop abdominal pain, vomiting, fevers, or other concerning symptoms.

## 2023-09-17 NOTE — ED Provider Triage Note (Signed)
 Emergency Medicine Provider Triage Evaluation Note  Scott Valentine , a 68 y.o. male  was evaluated in triage.  Pt complains of abdominal pain from hernia.  States that it started to get red recently which is new.  Denies nausea or vomiting.  Review of Systems  Positive: As above Negative: As above  Physical Exam  BP 133/78 (BP Location: Right Arm)   Pulse 62   Temp 97.7 F (36.5 C) (Oral)   Resp 18   SpO2 100%  Gen:   Awake, no distress   Resp:  Normal effort  MSK:   Moves extremities without difficulty  Other:  Significant pain when attempting to reduce umbilical hernia  Medical Decision Making  Medically screening exam initiated at 3:30 PM.  Appropriate orders placed.  Scott Valentine was informed that the remainder of the evaluation will be completed by another provider, this initial triage assessment does not replace that evaluation, and the importance of remaining in the ED until their evaluation is complete.     Hildegard Loge, PA-C 09/17/23 1531

## 2023-09-17 NOTE — Assessment & Plan Note (Signed)
 Chronic, with acute discomfort Supervising physician was asked to evaluate the patient as well and so she did come in to assess patient today with me.  Because hernia is painful and not reducible there is concerned that it is acutely strangulated.  Recommendation was for patient to proceed to the emergency department for emergent evaluation and possible surgical treatment.  Patient is agreeable, he was offered EMS transport but declined and reports he and his wife can transport him to the ER themselves.

## 2023-09-17 NOTE — ED Triage Notes (Signed)
 Pt here for umbilical hernia, redness noted x 1 day and increase in swelling x 1 week. States he has not had any prior scans.

## 2023-09-17 NOTE — ED Provider Notes (Signed)
 [place] EMERGENCY DEPARTMENT AT [facility] Provider Note   CSN: [id number] Arrival date & time: 09/17/23  1521     History  Chief Complaint  Patient presents with   Hernia    HOANG PETTINGILL is a 68 y.o. male.  68 year old male with past medical history of diabetes, hypertension, and hyperlipidemia presents the emergency department today with concern for umbilical hernia.  The patient states that he has had an umbilical hernia for years.  He states that over the past week that he has noticed some erythema at the area.  A1c is primary care doctor today and was subsequently sent to the emergency department for further evaluation.  The patient reports having normal bowel movement with this.  Denies any nausea or vomiting.  He states that he is normally comfortable as he presses on the area which does cause some discomfort.        Home Medications Prior to Admission medications   Medication Sig Start Date End Date Taking? Authorizing Provider  aspirin  EC 81 MG tablet Take 81 mg by mouth daily.    [provider]  clonazePAM  (KLONOPIN ) 0.5 MG tablet Take 1 tablet by mouth twice daily as needed for anxiety 09/05/23   Norleen Lynwood ORN, MD  escitalopram  (LEXAPRO ) 20 MG tablet Take 1 tablet by mouth once daily 08/04/23   Norleen Lynwood ORN, MD  fexofenadine  (ALLEGRA ) 180 MG tablet Take 1 tablet (180 mg total) by mouth daily as needed. 01/26/20   Norleen Lynwood ORN, MD  folic acid  (FOLVITE ) 1 MG tablet Take 1 tablet (1 mg total) by mouth daily. 01/26/20   Norleen Lynwood ORN, MD  JARDIANCE  25 MG TABS tablet Take 1 tablet by mouth once daily 08/25/23   Norleen Lynwood ORN, MD  losartan  (COZAAR ) 100 MG tablet Take 1 tablet by mouth once daily 08/25/23   Norleen Lynwood ORN, MD  magnesium  oxide (MAG-OX) 400 MG tablet Take 400 mg by mouth daily.    [provider]  metFORMIN  (GLUCOPHAGE -XR) 500 MG 24 hr tablet TAKE 4 TABLETS BY MOUTH ONCE DAILY WITH BREAKFAST 04/30/23   Norleen Lynwood ORN, MD   metoprolol  tartrate (LOPRESSOR ) 25 MG tablet Take 1/2 (one-half) tablet by mouth once daily 05/26/23   Norleen Lynwood ORN, MD  Multiple Vitamin (MULTIVITAMIN) tablet Take 1 tablet by mouth daily.    [provider]  pioglitazone  (ACTOS ) 15 MG tablet Take 1 tablet by mouth once daily 04/30/23   Norleen Lynwood ORN, MD  rosuvastatin  (CRESTOR ) 40 MG tablet Take 1 tablet by mouth once daily 07/11/23   Norleen Lynwood ORN, MD  Semaglutide , 2 MG/DOSE, 8 MG/3ML SOPN Inject 2 mg as directed once a week. 11/18/22   Norleen Lynwood ORN, MD  sildenafil  (VIAGRA ) 100 MG tablet TAKE 1 TABLET BY MOUTH ONCE DAILY AS NEEDED FOR ERECTILE DYSFUNCTION 09/09/23   Norleen Lynwood ORN, MD  vitamin C (ASCORBIC ACID) 500 MG tablet Take 500 mg by mouth daily.    [provider]      Allergies    Sulfonamide derivatives, Atorvastatin, and Ezetimibe     Review of Systems   Review of Systems  Gastrointestinal:        Hernia   All other systems reviewed and are negative.   Physical Exam Updated Vital Signs BP (!) 150/74 (BP Location: Right Arm)   Pulse (!) 52   Temp 97.8 F (36.6 C) (Oral)   Resp 18   Ht 5' 9 (1.753 m)   Wt  93.4 kg   SpO2 99%   BMI 30.42 kg/m  Physical Exam Vitals and nursing note reviewed.   Gen: NAD Eyes: PERRL, EOMI HEENT: no oropharyngeal swelling Neck: trachea midline Resp: clear to auscultation bilaterally Card: RRR, no murmurs, rubs, or gallops Abd: nontender, nondistended, there is a small umbilical hernia noted with some mild overlying erythema.  Contents are partially reducible. Extremities: no calf tenderness, no edema Vascular: 2+ radial pulses bilaterally, 2+ DP pulses bilaterally Skin: no rashes Psyc: acting appropriately   ED Results / Procedures / Treatments   Labs (all labs ordered are listed, but only abnormal results are displayed) Labs Reviewed  LIPASE, BLOOD - Abnormal; Notable for the following components:      Result Value   Lipase 88 (*)    All other components  within normal limits  URINALYSIS, ROUTINE W REFLEX MICROSCOPIC - Abnormal; Notable for the following components:   Color, Urine STRAW (*)    Glucose, UA >=500 (*)    All other components within normal limits  CBC WITH DIFFERENTIAL/PLATELET  COMPREHENSIVE METABOLIC PANEL    EKG None  Radiology CT ABDOMEN PELVIS W CONTRAST Result Date: 09/17/2023 CLINICAL DATA:  Acute abdominal pain and umbilical hernia, initial encounter EXAM: CT ABDOMEN AND PELVIS WITH CONTRAST TECHNIQUE: Multidetector CT imaging of the abdomen and pelvis was performed using the standard protocol following bolus administration of intravenous contrast. RADIATION DOSE REDUCTION: This exam was performed according to the departmental dose-optimization program which includes automated exposure control, adjustment of the mA and/or kV according to patient size and/or use of iterative reconstruction technique. CONTRAST:  75mL OMNIPAQUE  IOHEXOL  350 MG/ML SOLN COMPARISON:  07/07/2023 FINDINGS: Lower chest: Stable scarring in the left lower lobe is noted posteriorly. Hepatobiliary: Single gallstone is noted near the neck of the gallbladder. No complicating factors are seen. The liver is within normal limits. Pancreas: Unremarkable. No pancreatic ductal dilatation or surrounding inflammatory changes. Spleen: Normal in size without focal abnormality. Adrenals/Urinary Tract: Adrenal glands are within normal limits. Kidneys demonstrate a normal enhancement pattern bilaterally. No renal calculi or obstructive changes are noted. Bladder is well distended. Stomach/Bowel: Scattered diverticular change of the colon is noted without evidence of diverticulitis. No obstructive changes are seen. The appendix is within normal limits. Small bowel and stomach are within normal limits. Vascular/Lymphatic: Aortic atherosclerosis. No enlarged abdominal or pelvic lymph nodes. Reproductive: Prostate is unremarkable. Other: No free fluid is noted. Fat containing  umbilical hernia is noted with mild inflammatory changes consistent with the given clinical history. Musculoskeletal: No acute or significant osseous findings. IMPRESSION: Fat containing umbilical hernia with mild subcutaneous inflammatory changes. This corresponds with the given clinical history. No focal abscess is seen. Diverticulosis without diverticulitis. Cholelithiasis without complicating factors. Electronically Signed   By: Oneil Devonshire M.D.   On: 09/17/2023 19:03    Procedures Procedures    Medications Ordered in ED Medications  iohexol  (OMNIPAQUE ) 350 MG/ML injection 75 mL (75 mLs Intravenous Contrast Given 09/17/23 1729)    ED Course/ Medical Decision Making/ A&P                                 Medical Decision Making 68 year old male with past medical history of diabetes and hypertension presenting to the emergency department today with umbilical hernia.  Patient's labs here drawn at triage are unremarkable.  CT scan shows a fat-containing umbilical hernia with mild induration around the area.  The patient is  stable here with reassuring vital signs.  I think that he is stable for discharge.  I will give him general surgery follow-up with return precautions.           Final Clinical Impression(s) / ED Diagnoses Final diagnoses:  Periumbilical hernia    Rx / DC Orders ED Discharge Orders     None         Ula Prentice SAUNDERS, MD 09/17/23 2140

## 2023-09-18 ENCOUNTER — Other Ambulatory Visit (HOSPITAL_COMMUNITY): Payer: Self-pay

## 2023-09-18 DIAGNOSIS — M79676 Pain in unspecified toe(s): Secondary | ICD-10-CM | POA: Diagnosis not present

## 2023-09-18 DIAGNOSIS — B351 Tinea unguium: Secondary | ICD-10-CM | POA: Diagnosis not present

## 2023-09-18 DIAGNOSIS — E1142 Type 2 diabetes mellitus with diabetic polyneuropathy: Secondary | ICD-10-CM | POA: Diagnosis not present

## 2023-09-18 DIAGNOSIS — L84 Corns and callosities: Secondary | ICD-10-CM | POA: Diagnosis not present

## 2023-09-19 ENCOUNTER — Ambulatory Visit: Payer: PPO | Admitting: Internal Medicine

## 2023-09-24 ENCOUNTER — Telehealth: Payer: Self-pay

## 2023-09-24 NOTE — Progress Notes (Signed)
 Transition Care Management Unsuccessful Follow-up Telephone Call  Date of discharge and from where:  09/17/2023 [facility]  Attempts:  1st Attempt  Reason for unsuccessful TCM follow-up call:  No answer/busy  Wynn Alldredge [facility]  [facility] Guide Direct Dial: [phone]  Fax: [phone] Website: [email or web address]

## 2023-09-25 ENCOUNTER — Telehealth: Payer: Self-pay

## 2023-09-25 NOTE — Progress Notes (Signed)
Transition Care Management Follow-up Telephone Call Date of discharge and from where: 09/17/2023 [facility] How have you been since you were released from the hospital? Patient stated he is not feeling any better, still has abdominal pain. Any questions or concerns? No  Items Reviewed: Did the pt receive and understand the discharge instructions provided? Yes  Medications obtained and verified?  No medication prescribed just OTC Tylenol and Advil. Other? No  Any new allergies since your discharge? No  Dietary orders reviewed? Yes Do you have support at home? Yes   Follow up appointments reviewed:  PCP Hospital f/u appt confirmed? Yes  Scheduled to see Corwin Levins, MD on 11/21/2023 @ [facility]. Specialist Hospital f/u appt confirmed? Yes  Scheduled to see [name], MD on 09/30/2023 @ General Surgery. Are transportation arrangements needed? No  If their condition worsens, is the pt aware to call PCP or go to the Emergency Dept.? Yes Was the patient provided with contact information for the PCP's office or ED? Yes Was to pt encouraged to call back with questions or concerns? Yes   Merikay Lesniewski [facility]  [facility] Guide Direct Dial: [phone]  Fax: [phone] Website: [email or web address]

## 2023-09-30 ENCOUNTER — Telehealth: Payer: Self-pay | Admitting: *Deleted

## 2023-09-30 ENCOUNTER — Telehealth (HOSPITAL_BASED_OUTPATIENT_CLINIC_OR_DEPARTMENT_OTHER): Payer: Self-pay

## 2023-09-30 ENCOUNTER — Ambulatory Visit: Payer: Self-pay | Admitting: General Surgery

## 2023-09-30 DIAGNOSIS — K429 Umbilical hernia without obstruction or gangrene: Secondary | ICD-10-CM | POA: Diagnosis not present

## 2023-09-30 NOTE — Telephone Encounter (Signed)
   Pre-operative Risk Assessment    Patient Name: Scott Valentine  DOB: 1956/08/29 MRN: [id number]   Date of last office visit: 08/06/2023 Date of next office visit: TBD 07/2024   Request for Surgical Clearance    Procedure:   Hernia Surgery  Date of Surgery:  Clearance TBD                                Surgeon:  [name], MD Surgeon's Group or Practice Name:  [facility] Phone number:  [phone] Fax number:  [phone]   Type of Clearance Requested:   - Medical  - Pharmacy:  Hold Aspirin     Type of Anesthesia:  General    Additional requests/questions:  Please advise surgeon/provider what medications should be held.  Please call [phone] and leave message with triage nurse if pt will require office visit or further medical work up before clearance can be given.  Please FAX a note of Cardiac CLEARANCE to [phone], Brennan Bailey, CMA  Signed, Milagros Reap Rayford Williamsen   09/30/2023, 11:14 AM

## 2023-09-30 NOTE — Telephone Encounter (Signed)
   Name: Scott Valentine  DOB: 1956-06-12  MRN: [id number]  Primary Cardiologist: Jodelle Red, MD   Preoperative team, please contact this patient and set up a phone call appointment for further preoperative risk assessment. Please obtain consent and complete medication review. Thank you for your help.  I confirm that guidance regarding antiplatelet and oral anticoagulation therapy has been completed and, if necessary, noted below.  Per office protocol, if patient is without any new symptoms or concerns at the time of their virtual visit, she may hold Aspirin for 5-7 days prior to procedure. Please resume Aspirin as soon as possible postprocedure, at the discretion of the surgeon.    I also confirmed the patient resides in the state of [place]. As per [facility] Medical Board telemedicine laws, the patient must reside in the state in which the provider is licensed.   Joylene Grapes, NP 09/30/2023, 12:24 PM [facility]

## 2023-09-30 NOTE — Telephone Encounter (Signed)
Left message to call back to schedule tele pre op appt.  

## 2023-09-30 NOTE — Telephone Encounter (Signed)
Patient returning call.

## 2023-09-30 NOTE — Telephone Encounter (Signed)
Pt has been scheduled tele preop appt. Med rec and consent are done.     Patient Consent for Virtual Visit        Scott Valentine has provided verbal consent on 09/30/2023 for a virtual visit (video or telephone).   CONSENT FOR VIRTUAL VISIT FOR:  Scott Valentine  By participating in this virtual visit I agree to the following:  I hereby voluntarily request, consent and authorize [facility] and its employed or contracted physicians, physician assistants, nurse practitioners or other licensed health care professionals (the Practitioner), to provide me with telemedicine health care services (the "Services") as deemed necessary by the treating Practitioner. I acknowledge and consent to receive the Services by the Practitioner via telemedicine. I understand that the telemedicine visit will involve communicating with the Practitioner through live audiovisual communication technology and the disclosure of certain medical information by electronic transmission. I acknowledge that I have been given the opportunity to request an in-person assessment or other available alternative prior to the telemedicine visit and am voluntarily participating in the telemedicine visit.  I understand that I have the right to withhold or withdraw my consent to the use of telemedicine in the course of my care at any time, without affecting my right to future care or treatment, and that the Practitioner or I may terminate the telemedicine visit at any time. I understand that I have the right to inspect all information obtained and/or recorded in the course of the telemedicine visit and may receive copies of available information for a reasonable fee.  I understand that some of the potential risks of receiving the Services via telemedicine include:  Delay or interruption in medical evaluation due to technological equipment failure or disruption; Information transmitted may not be sufficient (e.g. poor resolution of  images) to allow for appropriate medical decision making by the Practitioner; and/or  In rare instances, security protocols could fail, causing a breach of personal health information.  Furthermore, I acknowledge that it is my responsibility to provide information about my medical history, conditions and care that is complete and accurate to the best of my ability. I acknowledge that Practitioner's advice, recommendations, and/or decision may be based on factors not within their control, such as incomplete or inaccurate data provided by me or distortions of diagnostic images or specimens that may result from electronic transmissions. I understand that the practice of medicine is not an exact science and that Practitioner makes no warranties or guarantees regarding treatment outcomes. I acknowledge that a copy of this consent can be made available to me via my patient portal [facility] MyChart), or I can request a printed copy by calling the office of [facility].    I understand that my insurance will be billed for this visit.   I have read or had this consent read to me. I understand the contents of this consent, which adequately explains the benefits and risks of the Services being provided via telemedicine.  I have been provided ample opportunity to ask questions regarding this consent and the Services and have had my questions answered to my satisfaction. I give my informed consent for the services to be provided through the use of telemedicine in my medical care

## 2023-09-30 NOTE — Telephone Encounter (Signed)
Pt has been scheduled tele preop appt. Med rec and consent are done.

## 2023-10-10 ENCOUNTER — Telehealth: Payer: Self-pay | Admitting: Internal Medicine

## 2023-10-10 NOTE — Telephone Encounter (Signed)
 Placed on provider desk

## 2023-10-10 NOTE — Telephone Encounter (Signed)
Patient dropped off document Patient Assistance Application, to be filled out by provider. Patient requested to send it back via Fax within 7-days. Document is located in providers tray at front office.Please advise at Mobile [phone] (mobile)    Patient would like a call when the form is faxed.

## 2023-10-13 ENCOUNTER — Ambulatory Visit: Payer: PPO | Attending: Cardiovascular Disease

## 2023-10-13 DIAGNOSIS — Z01810 Encounter for preprocedural cardiovascular examination: Secondary | ICD-10-CM | POA: Diagnosis not present

## 2023-10-13 NOTE — Progress Notes (Signed)
Virtual Visit via Telephone Note   Because of Scott Valentine's co-morbid illnesses, he is at least at moderate risk for complications without adequate follow up.  This format is felt to be most appropriate for this patient at this time.  The patient did not have access to video technology/had technical difficulties with video requiring transitioning to audio format only (telephone).  All issues noted in this document were discussed and addressed.  No physical exam could be performed with this format.  Please refer to the patient's chart for his consent to telehealth for [facility].  Evaluation Performed:  Preoperative cardiovascular risk assessment _____________   Date:  10/13/2023   Patient ID:  Scott Valentine, DOB [birth year], MRN [id number] Patient Location:  Home Provider location:   Office  Primary Care Provider:  Corwin Levins, MD Primary Cardiologist:  Jodelle Red, MD  Chief Complaint / Patient Profile   68 y.o. y/o male with a h/o aortic valve stenosis, coronary artery disease, hyperlipidemia who is pending hernia surgery and presents today for telephonic preoperative cardiovascular risk assessment.  History of Present Illness    Scott Valentine is a 68 y.o. male who presents via audio/video conferencing for a telehealth visit today.  Pt was last seen in cardiology clinic on 08/06/2023 by Eligha Bridegroom NP.  At that time Scott Valentine was doing well .  The patient is now pending procedure as outlined above. Since his last visit, he remains stable from a cardiac standpoint.  Today he denies chest pain, shortness of breath, lower extremity edema, fatigue, palpitations, melena, hematuria, hemoptysis, diaphoresis, weakness, presyncope, syncope, orthopnea, and PND.   Past Medical History    Past Medical History:  Diagnosis Date   Allergy    Anxiety state, unspecified 10/08/2007   Carpal tunnel syndrome    Cervical radiculopathy    numbness in  finger tips after cervical surgery    Clotting disorder (HCC)    DVT - secondary vein left leg    Cough 11/08/2009   Diabetes mellitus without complication (HCC)    DVT (deep venous thrombosis) (HCC) 2020   right leg, dissolved itself   Dysmetabolic syndrome X 10/08/2007   GERD 05/23/2008   past hx - not current issue    GLUCOSE INTOLERANCE 10/19/2008   HYPERLIPIDEMIA 10/08/2007   HYPERPLASIA PROSTATE UNS W/O UR OBST & OTH LUTS 10/08/2007   Impaired glucose tolerance 12/30/2010   Other and unspecified hyperlipidemia 05/01/2007   Rectal bleeding    STEVENS-JOHNSON SYNDROME, HX OF 10/08/2007   TOBACCO USE, QUIT 10/08/2007   UNS ADVRS EFF UNS RX MEDICINAL&BIOLOGICAL SBSTNC 12/17/2007   Unspecified essential hypertension 05/01/2007   BP increases with being upset    URI 10/29/2007   Past Surgical History:  Procedure Laterality Date   ANAL FISSURE REPAIR  04/25/11   COLONOSCOPY     DIGIT NAIL REMOVAL Right 08/18/2019   Procedure: REMOVAL OF RIGHT THUMB INGROWN NAIL;  Surgeon: Tarry Kos, MD;  Location: [facility];  Service: Orthopedics;  Laterality: Right;   finger surgury' Left 05/30/2008   3rd finger   HEMORRHOID SURGERY  04/25/11   POLYPECTOMY     POSTERIOR CERVICAL LAMINECTOMY Right 10/21/2018   Procedure: Right Cervical five-six Cervical six-seven Posterior cervical foraminotomies;  Surgeon: Jadene Pierini, MD;  Location: [facility] OR;  Service: Neurosurgery;  Laterality: Right;    Allergies  Allergies  Allergen Reactions   Sulfonamide Derivatives Other (See Comments)    stevens johnson  Atorvastatin Other (See Comments)    myalgias   Ezetimibe Other (See Comments)    myalgias    Home Medications    Prior to Admission medications   Medication Sig Start Date End Date Taking? Authorizing Provider  aspirin EC 81 MG tablet Take 81 mg by mouth daily.    [provider]  clonazePAM Scarlette Calico) 0.5 MG tablet Take 1 tablet by mouth twice daily as needed for anxiety  09/05/23   Corwin Levins, MD  escitalopram (LEXAPRO) 20 MG tablet Take 1 tablet by mouth once daily 08/04/23   Corwin Levins, MD  fexofenadine (ALLEGRA) 180 MG tablet Take 1 tablet (180 mg total) by mouth daily as needed. 01/26/20   Corwin Levins, MD  folic acid (FOLVITE) 1 MG tablet Take 1 tablet (1 mg total) by mouth daily. 01/26/20   Corwin Levins, MD  JARDIANCE 25 MG TABS tablet Take 1 tablet by mouth once daily 08/25/23   Corwin Levins, MD  losartan (COZAAR) 100 MG tablet Take 1 tablet by mouth once daily 08/25/23   Corwin Levins, MD  magnesium oxide (MAG-OX) 400 MG tablet Take 400 mg by mouth daily.    [provider]  metFORMIN (GLUCOPHAGE-XR) 500 MG 24 hr tablet TAKE 4 TABLETS BY MOUTH ONCE DAILY WITH BREAKFAST 04/30/23   Corwin Levins, MD  metoprolol tartrate (LOPRESSOR) 25 MG tablet Take 1/2 (one-half) tablet by mouth once daily 05/26/23   Corwin Levins, MD  Multiple Vitamin (MULTIVITAMIN) tablet Take 1 tablet by mouth daily.    [provider]  pioglitazone (ACTOS) 15 MG tablet Take 1 tablet by mouth once daily 04/30/23   Corwin Levins, MD  rosuvastatin (CRESTOR) 40 MG tablet Take 1 tablet by mouth once daily 07/11/23   Corwin Levins, MD  Semaglutide, 2 MG/DOSE, 8 MG/3ML SOPN Inject 2 mg as directed once a week. 11/18/22   Corwin Levins, MD  sildenafil (VIAGRA) 100 MG tablet TAKE 1 TABLET BY MOUTH ONCE DAILY AS NEEDED FOR ERECTILE DYSFUNCTION 09/09/23   Corwin Levins, MD  vitamin C (ASCORBIC ACID) 500 MG tablet Take 500 mg by mouth daily.    [provider]    Physical Exam    Vital Signs:  Scott Valentine does not have vital signs available for review today.  Given telephonic nature of communication, physical exam is limited. AAOx3. NAD. Normal affect.  Speech and respirations are unlabored.  Accessory Clinical Findings    None  Assessment & Plan    1.  Preoperative Cardiovascular Risk Assessment:Procedure:   Hernia Surgery   Date of Surgery:   Clearance TBD                                  Surgeon:  [name], MD Surgeon's Group or Practice Name:  [facility] Phone number:  [phone] Fax number:  [phone]      Primary Cardiologist: Jodelle Red, MD  Chart reviewed as part of pre-operative protocol coverage. Given past medical history and time since last visit, based on ACC/AHA guidelines, Scott Valentine would be at acceptable risk for the planned procedure without further cardiovascular testing.   His RCRI is moderate risk, 6.6% risk of major cardiac event.  He is able to complete greater than 4 METS of physical activity.  His aspirin may be held for 5 to 7 days prior to  his procedure.  Please resume as soon as hemostasis is achieved.  Patient was advised that if he develops new symptoms prior to surgery to contact our office to arrange a follow-up appointment.  He verbalized understanding.  I will route this recommendation to the requesting party via Epic fax function and remove from pre-op pool.       Time:   Today, I have spent 5 minutes with the patient with telehealth technology discussing medical history, symptoms, and management plan.     Ronney Asters, NP  10/13/2023, 7:16 AM

## 2023-10-13 NOTE — Telephone Encounter (Signed)
Forms back and front faxed over, confirmation fax received. Called and notified pt.

## 2023-10-24 ENCOUNTER — Telehealth: Payer: Self-pay | Admitting: Cardiology

## 2023-10-24 NOTE — Telephone Encounter (Signed)
I will forward this message to preop APP to review if the pt has been cleared.

## 2023-10-24 NOTE — Telephone Encounter (Signed)
Wife is following up regarding clearance. She states requesting office has not received clearance recommendation and she would like updates.

## 2023-11-01 DIAGNOSIS — H00021 Hordeolum internum right upper eyelid: Secondary | ICD-10-CM | POA: Diagnosis not present

## 2023-11-10 ENCOUNTER — Other Ambulatory Visit (INDEPENDENT_AMBULATORY_CARE_PROVIDER_SITE_OTHER): Payer: PPO

## 2023-11-10 DIAGNOSIS — E559 Vitamin D deficiency, unspecified: Secondary | ICD-10-CM | POA: Diagnosis not present

## 2023-11-10 DIAGNOSIS — E1165 Type 2 diabetes mellitus with hyperglycemia: Secondary | ICD-10-CM

## 2023-11-10 DIAGNOSIS — Z125 Encounter for screening for malignant neoplasm of prostate: Secondary | ICD-10-CM | POA: Diagnosis not present

## 2023-11-10 DIAGNOSIS — E538 Deficiency of other specified B group vitamins: Secondary | ICD-10-CM

## 2023-11-10 LAB — CBC WITH DIFFERENTIAL/PLATELET
Basophils Absolute: 0.1 10*3/uL (ref 0.0–0.1)
Basophils Relative: 0.7 % (ref 0.0–3.0)
Eosinophils Absolute: 0.2 10*3/uL (ref 0.0–0.7)
Eosinophils Relative: 2.3 % (ref 0.0–5.0)
HCT: 46.6 % (ref 39.0–52.0)
Hemoglobin: 16 g/dL (ref 13.0–17.0)
Lymphocytes Relative: 13 % (ref 12.0–46.0)
Lymphs Abs: 1.1 10*3/uL (ref 0.7–4.0)
MCHC: 34.3 g/dL (ref 30.0–36.0)
MCV: 93.6 fl (ref 78.0–100.0)
Monocytes Absolute: 0.6 10*3/uL (ref 0.1–1.0)
Monocytes Relative: 6.7 % (ref 3.0–12.0)
Neutro Abs: 6.6 10*3/uL (ref 1.4–7.7)
Neutrophils Relative %: 77.3 % — ABNORMAL HIGH (ref 43.0–77.0)
Platelets: 202 10*3/uL (ref 150.0–400.0)
RBC: 4.98 Mil/uL (ref 4.22–5.81)
RDW: 13.6 % (ref 11.5–15.5)
WBC: 8.5 10*3/uL (ref 4.0–10.5)

## 2023-11-10 LAB — VITAMIN B12: Vitamin B-12: 741 pg/mL (ref 211–911)

## 2023-11-10 LAB — URINALYSIS, ROUTINE W REFLEX MICROSCOPIC
Bilirubin Urine: NEGATIVE
Hgb urine dipstick: NEGATIVE
Ketones, ur: NEGATIVE
Leukocytes,Ua: NEGATIVE
Nitrite: NEGATIVE
Specific Gravity, Urine: 1.015 (ref 1.000–1.030)
Total Protein, Urine: NEGATIVE
Urine Glucose: >=1000 — AB
Urobilinogen, UA: 0.2 (ref 0.0–1.0)
pH: 6.5 (ref 5.0–8.0)

## 2023-11-10 LAB — MICROALBUMIN / CREATININE URINE RATIO
Creatinine,U: 71.6 mg/dL
Microalb Creat Ratio: 9.8 mg/g (ref 0.0–30.0)
Microalb, Ur: <0.7 mg/dL (ref 0.0–1.9)

## 2023-11-10 LAB — HEPATIC FUNCTION PANEL
ALT: 28 U/L (ref 0–53)
AST: 25 U/L (ref 0–37)
Albumin: 4.5 g/dL (ref 3.5–5.2)
Alkaline Phosphatase: 48 U/L (ref 39–117)
Bilirubin, Direct: 0.1 mg/dL (ref 0.0–0.3)
Total Bilirubin: 0.5 mg/dL (ref 0.2–1.2)
Total Protein: 7.3 g/dL (ref 6.0–8.3)

## 2023-11-10 LAB — VITAMIN D 25 HYDROXY (VIT D DEFICIENCY, FRACTURES): VITD: 55.48 ng/mL (ref 30.00–100.00)

## 2023-11-10 LAB — HEMOGLOBIN A1C: Hgb A1c MFr Bld: 6.5 % (ref 4.6–6.5)

## 2023-11-10 LAB — PSA: PSA: 1.24 ng/mL (ref 0.10–4.00)

## 2023-11-10 LAB — BASIC METABOLIC PANEL
BUN: 12 mg/dL (ref 6–23)
CO2: 27 meq/L (ref 19–32)
Calcium: 9.5 mg/dL (ref 8.4–10.5)
Chloride: 101 meq/L (ref 96–112)
Creatinine, Ser: 0.94 mg/dL (ref 0.40–1.50)
GFR: 84.84 mL/min (ref >60.00)
Glucose, Bld: 113 mg/dL — ABNORMAL HIGH (ref 70–99)
Potassium: 4.5 meq/L (ref 3.5–5.1)
Sodium: 138 meq/L (ref 135–145)

## 2023-11-10 LAB — LIPID PANEL
Cholesterol: 115 mg/dL (ref 0–200)
HDL: 43.1 mg/dL (ref >39.00)
LDL Cholesterol: 44 mg/dL (ref 0–99)
NonHDL: 72
Total CHOL/HDL Ratio: 3
Triglycerides: 142 mg/dL (ref 0.0–149.0)
VLDL: 28.4 mg/dL (ref 0.0–40.0)

## 2023-11-10 LAB — TSH: TSH: 3.04 u[IU]/mL (ref 0.35–5.50)

## 2023-11-14 NOTE — Progress Notes (Signed)
 PAT nurse noted pt has moderate aortic stenosis in cardiologist notes. Based on [facility] guidelines pt would not be a day surgery center candidate and would need to be moved to main OR. LVM with Dawn at [facility].

## 2023-11-17 ENCOUNTER — Ambulatory Visit (INDEPENDENT_AMBULATORY_CARE_PROVIDER_SITE_OTHER): Admitting: Internal Medicine

## 2023-11-17 ENCOUNTER — Encounter: Payer: Self-pay | Admitting: Internal Medicine

## 2023-11-17 VITALS — BP 128/78 | HR 65 | Temp 98.0°F | Ht 69.0 in | Wt 201.0 lb

## 2023-11-17 DIAGNOSIS — E782 Mixed hyperlipidemia: Secondary | ICD-10-CM

## 2023-11-17 DIAGNOSIS — I10 Essential (primary) hypertension: Secondary | ICD-10-CM

## 2023-11-17 DIAGNOSIS — Z0000 Encounter for general adult medical examination without abnormal findings: Secondary | ICD-10-CM

## 2023-11-17 DIAGNOSIS — E1165 Type 2 diabetes mellitus with hyperglycemia: Secondary | ICD-10-CM | POA: Diagnosis not present

## 2023-11-17 DIAGNOSIS — E559 Vitamin D deficiency, unspecified: Secondary | ICD-10-CM | POA: Diagnosis not present

## 2023-11-17 DIAGNOSIS — Z7984 Long term (current) use of oral hypoglycemic drugs: Secondary | ICD-10-CM

## 2023-11-17 DIAGNOSIS — Z01818 Encounter for other preprocedural examination: Secondary | ICD-10-CM

## 2023-11-17 DIAGNOSIS — F17200 Nicotine dependence, unspecified, uncomplicated: Secondary | ICD-10-CM

## 2023-11-17 DIAGNOSIS — Z0001 Encounter for general adult medical examination with abnormal findings: Secondary | ICD-10-CM

## 2023-11-17 NOTE — Progress Notes (Unsigned)
 Patient ID: Scott Valentine, male   DOB: May 14, 1956, 68 y.o.   MRN: [id number]        Chief Complaint: follow up HTN, HLD and hyperglycemia ***       HPI:  Scott Valentine is a 68 y.o. male here with c/o         Peak wt has been about 235 in past.  Wt Readings from Last 3 Encounters:  11/17/23 201 lb (91.2 kg)  09/17/23 206 lb (93.4 kg)  09/17/23 203 lb 4 oz (92.2 kg)   BP Readings from Last 3 Encounters:  11/17/23 128/78  09/17/23 (!) 150/74  09/17/23 118/72         Past Medical History:  Diagnosis Date   Allergy    Anxiety state, unspecified 10/08/2007   Carpal tunnel syndrome    Cervical radiculopathy    numbness in finger tips after cervical surgery    Clotting disorder (HCC)    DVT - secondary vein left leg    Cough 11/08/2009   Diabetes mellitus without complication (HCC)    DVT (deep venous thrombosis) (HCC) 2020   right leg, dissolved itself   Dysmetabolic syndrome X 10/08/2007   GERD 05/23/2008   past hx - not current issue    GLUCOSE INTOLERANCE 10/19/2008   HYPERLIPIDEMIA 10/08/2007   HYPERPLASIA PROSTATE UNS W/O UR OBST & OTH LUTS 10/08/2007   Impaired glucose tolerance 12/30/2010   Other and unspecified hyperlipidemia 05/01/2007   Rectal bleeding    STEVENS-JOHNSON SYNDROME, HX OF 10/08/2007   TOBACCO USE, QUIT 10/08/2007   UNS ADVRS EFF UNS RX MEDICINAL&BIOLOGICAL SBSTNC 12/17/2007   Unspecified essential hypertension 05/01/2007   BP increases with being upset    URI 10/29/2007   Past Surgical History:  Procedure Laterality Date   ANAL FISSURE REPAIR  04/25/11   COLONOSCOPY     DIGIT NAIL REMOVAL Right 08/18/2019   Procedure: REMOVAL OF RIGHT THUMB INGROWN NAIL;  Surgeon: Tarry Kos, MD;  Location: [facility];  Service: Orthopedics;  Laterality: Right;   finger surgury' Left 05/30/2008   3rd finger   HEMORRHOID SURGERY  04/25/11   POLYPECTOMY     POSTERIOR CERVICAL LAMINECTOMY Right 10/21/2018   Procedure: Right Cervical five-six Cervical  six-seven Posterior cervical foraminotomies;  Surgeon: Jadene Pierini, MD;  Location: [facility] OR;  Service: Neurosurgery;  Laterality: Right;    reports that he has been smoking cigarettes. He has a 48 pack-year smoking history. He has quit using smokeless tobacco. He reports that he does not drink alcohol and does not use drugs. family history includes Coronary artery disease (age of onset: 35) in his father; Diabetes in his mother; Heart attack in his paternal grandfather; Heart disease in his brother and father; Lung cancer in his father; Mental illness in his mother; Prostate cancer in his paternal grandfather. Allergies  Allergen Reactions   Sulfonamide Derivatives Other (See Comments)    stevens johnson   Current Outpatient Medications on File Prior to Visit  Medication Sig Dispense Refill   aspirin EC 81 MG tablet Take 81 mg by mouth daily.     clonazePAM (KLONOPIN) 0.5 MG tablet Take 1 tablet by mouth twice daily as needed for anxiety 60 tablet 2   escitalopram (LEXAPRO) 20 MG tablet Take 1 tablet by mouth once daily 90 tablet 3   fexofenadine (ALLEGRA) 180 MG tablet Take 1 tablet (180 mg total) by mouth daily as needed. 90 tablet 3   folic acid (FOLVITE)  1 MG tablet Take 1 tablet (1 mg total) by mouth daily. 90 tablet 3   JARDIANCE 25 MG TABS tablet Take 1 tablet by mouth once daily 90 tablet 0   losartan (COZAAR) 100 MG tablet Take 1 tablet by mouth once daily 90 tablet 3   magnesium oxide (MAG-OX) 400 MG tablet Take 400 mg by mouth daily.     metFORMIN (GLUCOPHAGE-XR) 500 MG 24 hr tablet TAKE 4 TABLETS BY MOUTH ONCE DAILY WITH BREAKFAST 360 tablet 3   metoprolol tartrate (LOPRESSOR) 25 MG tablet Take 1/2 (one-half) tablet by mouth once daily 45 tablet 2   Multiple Vitamin (MULTIVITAMIN) tablet Take 1 tablet by mouth daily.     pioglitazone (ACTOS) 15 MG tablet Take 1 tablet by mouth once daily 90 tablet 3   rosuvastatin (CRESTOR) 40 MG tablet Take 1 tablet by mouth once daily 90  tablet 3   Semaglutide, 2 MG/DOSE, 8 MG/3ML SOPN Inject 2 mg as directed once a week. 9 mL 3   sildenafil (VIAGRA) 100 MG tablet TAKE 1 TABLET BY MOUTH ONCE DAILY AS NEEDED FOR ERECTILE DYSFUNCTION 10 tablet 0   vitamin C (ASCORBIC ACID) 500 MG tablet Take 500 mg by mouth daily.     No current facility-administered medications on file prior to visit.        ROS:  All others reviewed and negative.  Objective        PE:  BP 128/78 (BP Location: Left Arm, Patient Position: Sitting, Cuff Size: Normal)   Pulse 65   Temp 98 F (36.7 C) (Oral)   Ht 5\' 9"  (1.753 m)   Wt 201 lb (91.2 kg)   SpO2 99%   BMI 29.68 kg/m                 Constitutional: Pt appears in NAD               HENT: Head: NCAT.                Right Ear: External ear normal.                 Left Ear: External ear normal.                Eyes: . Pupils are equal, round, and reactive to light. Conjunctivae and EOM are normal               Nose: without d/c or deformity               Neck: Neck supple. Gross normal ROM               Cardiovascular: Normal rate and regular rhythm.                 Pulmonary/Chest: Effort normal and breath sounds without rales or wheezing.                Abd:  Soft, NT, ND, + BS, no organomegaly               Neurological: Pt is alert. At baseline orientation, motor grossly intact               Skin: Skin is warm. No rashes, no other new lesions, LE edema - ***               Psychiatric: Pt behavior is normal without agitation   Micro: none  Cardiac tracings I have personally interpreted today:  none  Pertinent Radiological findings (summarize): none   Lab Results  Component Value Date   WBC 8.5 11/10/2023   HGB 16.0 11/10/2023   HCT 46.6 11/10/2023   PLT 202.0 11/10/2023   GLUCOSE 113 (H) 11/10/2023   CHOL 115 11/10/2023   TRIG 142.0 11/10/2023   HDL 43.10 11/10/2023   LDLDIRECT 88.0 05/02/2021   LDLCALC 44 11/10/2023   ALT 28 11/10/2023   AST 25 11/10/2023   NA 138 11/10/2023    K 4.5 11/10/2023   CL 101 11/10/2023   CREATININE 0.94 11/10/2023   BUN 12 11/10/2023   CO2 27 11/10/2023   TSH 3.04 11/10/2023   PSA 1.24 11/10/2023   HGBA1C 6.5 11/10/2023   MICROALBUR <0.7 11/10/2023   Assessment/Plan:  Scott Valentine is a 68 y.o. White or Caucasian [1] male with  has a past medical history of Allergy, Anxiety state, unspecified (10/08/2007), Carpal tunnel syndrome, Cervical radiculopathy, Clotting disorder (HCC), Cough (11/08/2009), Diabetes mellitus without complication (HCC), DVT (deep venous thrombosis) (HCC) (2020), Dysmetabolic syndrome X (10/08/2007), GERD (05/23/2008), GLUCOSE INTOLERANCE (10/19/2008), HYPERLIPIDEMIA (10/08/2007), HYPERPLASIA PROSTATE UNS W/O UR OBST & OTH LUTS (10/08/2007), Impaired glucose tolerance (12/30/2010), Other and unspecified hyperlipidemia (05/01/2007), Rectal bleeding, STEVENS-JOHNSON SYNDROME, HX OF (10/08/2007), TOBACCO USE, QUIT (10/08/2007), UNS ADVRS EFF UNS RX MEDICINAL&BIOLOGICAL SBSTNC (12/17/2007), Unspecified essential hypertension (05/01/2007), and URI (10/29/2007).  No problem-specific Assessment & Plan notes found for this encounter.  Followup: No follow-ups on file.  Oliver Barre, MD 11/17/2023 9:11 AM [facility]

## 2023-11-17 NOTE — Patient Instructions (Signed)
 Please continue all other medications as before, and refills have been done if requested.  Please have the pharmacy call with any other refills you may need.  Please continue your efforts at being more active, low cholesterol diet, and weight control.  You are otherwise up to date with prevention measures today.  Please keep your appointments with your specialists as you may have planned  Good Luck with your Surgury!  Please make an Appointment to return in 6 months, or sooner if needed, also with Lab Appointment for testing done 3-5 days before at the FIRST FLOOR Lab (so this is for TWO appointments - please see the scheduling desk as you leave)

## 2023-11-18 ENCOUNTER — Other Ambulatory Visit: Payer: Self-pay | Admitting: Internal Medicine

## 2023-11-18 ENCOUNTER — Other Ambulatory Visit: Payer: Self-pay

## 2023-11-18 NOTE — Pre-Procedure Instructions (Signed)
 Surgical Instructions   Your procedure is scheduled on November 21, 2023. Report to [facility] Main Entrance "A" at 8:15 A.M., then check in with the Admitting office. Any questions or running late day of surgery: call [phone]  Questions prior to your surgery date: call [phone], Monday-Friday, 8am-4pm. If you experience any cold or flu symptoms such as cough, fever, chills, shortness of breath, etc. between now and your scheduled surgery, please notify us at the above number.     Remember:  Do not eat after midnight the night before your surgery   You may drink clear liquids until 7:15 AM the morning of your surgery.   Clear liquids allowed are: Water, Non-Citrus Juices (without pulp), Carbonated Beverages, Clear Tea (no milk, honey, etc.), Black Coffee Only (NO MILK, CREAM OR POWDERED CREAMER of any kind), and Gatorade.    Take these medicines the morning of surgery with A SIP OF WATER: escitalopram (LEXAPRO)  fexofenadine (ALLEGRA)  metoprolol tartrate (LOPRESSOR)  rosuvastatin (CRESTOR)    May take these medicines IF NEEDED: clonazePAM (KLONOPIN)    Follow your surgeon's instructions on when to stop Aspirin.  If no instructions were given by your surgeon then you will need to call the office to get those instructions.     One week prior to surgery, STOP taking any Aleve, Naproxen, Ibuprofen, Motrin, Advil, Goody's, BC's, all herbal medications, fish oil, and non-prescription vitamins.   WHAT DO I DO ABOUT MY DIABETES MEDICATION?   Do not take metFORMIN (GLUCOPHAGE-XR) or pioglitazone (ACTOS) the morning of surgery.  STOP taking your JARDIANCE three days prior to surgery. Your last dose will be March 10th.     STOP taking your Semaglutide one week prior to surgery. DO NOT take any doses after March 6th.   HOW TO MANAGE YOUR DIABETES BEFORE AND AFTER SURGERY  Why is it important to control my blood sugar before and after surgery? Improving blood sugar levels  before and after surgery helps healing and can limit problems. A way of improving blood sugar control is eating a healthy diet by:  Eating less sugar and carbohydrates  Increasing activity/exercise  Talking with your doctor about reaching your blood sugar goals High blood sugars (greater than 180 mg/dL) can raise your risk of infections and slow your recovery, so you will need to focus on controlling your diabetes during the weeks before surgery. Make sure that the doctor who takes care of your diabetes knows about your planned surgery including the date and location.  How do I manage my blood sugar before surgery? Check your blood sugar at least 4 times a day, starting 2 days before surgery, to make sure that the level is not too high or low.  Check your blood sugar the morning of your surgery when you wake up and every 2 hours until you get to the Short Stay unit.  If your blood sugar is less than 70 mg/dL, you will need to treat for low blood sugar: Do not take insulin. Treat a low blood sugar (less than 70 mg/dL) with  cup of clear juice (cranberry or apple), 4 glucose tablets, OR glucose gel. Recheck blood sugar in 15 minutes after treatment (to make sure it is greater than 70 mg/dL). If your blood sugar is not greater than 70 mg/dL on recheck, call [phone] for further instructions. Report your blood sugar to the short stay nurse when you get to Short Stay.  If you are admitted to the hospital after surgery: Your  blood sugar will be checked by the staff and you will probably be given insulin after surgery (instead of oral diabetes medicines) to make sure you have good blood sugar levels. The goal for blood sugar control after surgery is 80-180 mg/dL.                      Do NOT Smoke (Tobacco/Vaping) for 24 hours prior to your procedure.  If you use a CPAP at night, you may bring your mask/headgear for your overnight stay.   You will be asked to remove any contacts, glasses,  piercing's, hearing aid's, dentures/partials prior to surgery. Please bring cases for these items if needed.    Patients discharged the day of surgery will not be allowed to drive home, and someone needs to stay with them for 24 hours.  SURGICAL WAITING ROOM VISITATION Patients may have no more than 2 support people in the waiting area - these visitors may rotate.   Pre-op nurse will coordinate an appropriate time for 1 ADULT support person, who may not rotate, to accompany patient in pre-op.  Children under the age of 67 must have an adult with them who is not the patient and must remain in the main waiting area with an adult.  If the patient needs to stay at the hospital during part of their recovery, the visitor guidelines for inpatient rooms apply.  Please refer to the [facility] website for the visitor guidelines for any additional information.   If you received a COVID test during your pre-op visit  it is requested that you wear a mask when out in public, stay away from anyone that may not be feeling well and notify your surgeon if you develop symptoms. If you have been in contact with anyone that has tested positive in the last 10 days please notify you surgeon.      Pre-operative CHG Bathing Instructions   You can play a key role in reducing the risk of infection after surgery. Your skin needs to be as free of germs as possible. You can reduce the number of germs on your skin by washing with CHG (chlorhexidine gluconate) soap before surgery. CHG is an antiseptic soap that kills germs and continues to kill germs even after washing.   DO NOT use if you have an allergy to chlorhexidine/CHG or antibacterial soaps. If your skin becomes reddened or irritated, stop using the CHG and notify one of our RNs at [phone].              TAKE A SHOWER THE NIGHT BEFORE SURGERY AND THE DAY OF SURGERY    Please keep in mind the following:  DO NOT shave, including legs and underarms, 48 hours  prior to surgery.   You may shave your face before/day of surgery.  Place clean sheets on your bed the night before surgery Use a clean washcloth (not used since being washed) for each shower. DO NOT sleep with pet's night before surgery.  CHG Shower Instructions:  Wash your face and private area with normal soap. If you choose to wash your hair, wash first with your normal shampoo.  After you use shampoo/soap, rinse your hair and body thoroughly to remove shampoo/soap residue.  Turn the water OFF and apply half the bottle of CHG soap to a CLEAN washcloth.  Apply CHG soap ONLY FROM YOUR NECK DOWN TO YOUR TOES (washing for 3-5 minutes)  DO NOT use CHG soap on face, private areas,  open wounds, or sores.  Pay special attention to the area where your surgery is being performed.  If you are having back surgery, having someone wash your back for you may be helpful. Wait 2 minutes after CHG soap is applied, then you may rinse off the CHG soap.  Pat dry with a clean towel  Put on clean pajamas    Additional instructions for the day of surgery: DO NOT APPLY any lotions, deodorants, cologne, or perfumes.   Do not wear jewelry or makeup Do not wear nail polish, gel polish, artificial nails, or any other type of covering on natural nails (fingers and toes) Do not bring valuables to the hospital. [facility] is not responsible for valuables/personal belongings. Put on clean/comfortable clothes.  Please brush your teeth.  Ask your nurse before applying any prescription medications to the skin.

## 2023-11-19 ENCOUNTER — Other Ambulatory Visit: Payer: Self-pay

## 2023-11-19 ENCOUNTER — Encounter (HOSPITAL_COMMUNITY)
Admission: RE | Admit: 2023-11-19 | Discharge: 2023-11-19 | Disposition: A | Discharge status: Home / Self Care | Source: Ambulatory Visit | Attending: General Surgery | Admitting: General Surgery

## 2023-11-19 ENCOUNTER — Encounter: Payer: Self-pay | Admitting: Internal Medicine

## 2023-11-19 ENCOUNTER — Encounter (HOSPITAL_COMMUNITY): Payer: Self-pay

## 2023-11-19 DIAGNOSIS — Z01812 Encounter for preprocedural laboratory examination: Secondary | ICD-10-CM | POA: Insufficient documentation

## 2023-11-19 DIAGNOSIS — I350 Nonrheumatic aortic (valve) stenosis: Secondary | ICD-10-CM | POA: Diagnosis not present

## 2023-11-19 DIAGNOSIS — Z01818 Encounter for other preprocedural examination: Secondary | ICD-10-CM | POA: Insufficient documentation

## 2023-11-19 DIAGNOSIS — E785 Hyperlipidemia, unspecified: Secondary | ICD-10-CM | POA: Insufficient documentation

## 2023-11-19 DIAGNOSIS — I2510 Atherosclerotic heart disease of native coronary artery without angina pectoris: Secondary | ICD-10-CM | POA: Insufficient documentation

## 2023-11-19 DIAGNOSIS — I10 Essential (primary) hypertension: Secondary | ICD-10-CM | POA: Insufficient documentation

## 2023-11-19 DIAGNOSIS — Z86718 Personal history of other venous thrombosis and embolism: Secondary | ICD-10-CM | POA: Insufficient documentation

## 2023-11-19 DIAGNOSIS — F17210 Nicotine dependence, cigarettes, uncomplicated: Secondary | ICD-10-CM | POA: Diagnosis not present

## 2023-11-19 HISTORY — DX: Atherosclerotic heart disease of native coronary artery without angina pectoris: I25.10

## 2023-11-19 HISTORY — DX: Cardiac murmur, unspecified: R01.1

## 2023-11-19 LAB — GLUCOSE, CAPILLARY: Glucose-Capillary: 111 mg/dL — ABNORMAL HIGH (ref 70–99)

## 2023-11-19 NOTE — Progress Notes (Signed)
 PCP - Dr. Oliver Barre Cardiologist - Dr. Jodelle Red - Last office visit 08/06/2023  PPM/ICD - Denies Device Orders - n/a Rep Notified - n/a  Chest x-ray - n/a EKG - 05/07/2023 Stress Test - 05/28/2023 ECHO - 05/21/2023 Cardiac Cath - Denies  Sleep Study - Denies CPAP - n/a  Pt is DM2. He has a glucose meter but does not check his sugars at home, thus fasting blood sugar unknown. CBG at pre-op appointment 111. Last A1c 6.4 on 11-10-2023  Last dose of GLP1 agonist-  Pts last dose of Ozempic February 28th. GLP1 instructions: Pt instructed to NOT take anymore doses prior to surgery  Blood Thinner Instructions: n/a Aspirin Instructions: Pt instructed to hold ASA for one week. Last dose was March 6th.  ERAS Protcol - Clear liquids until 0715 morning of surgery PRE-SURGERY Ensure or G2- n/a  COVID TEST- n/a   Anesthesia review: Yes. Cardiac clearance. Per pt, was never placed on blood thinner for hx of DVTs. They always "dissolved on their own"  Patient denies shortness of breath, fever, cough and chest pain at PAT appointment. Pt denies any respiratory illness/infection in the last two months   All instructions explained to the patient, with a verbal understanding of the material. Patient agrees to go over the instructions while at home for a better understanding. Patient also instructed to self quarantine after being tested for COVID-19. The opportunity to ask questions was provided.

## 2023-11-19 NOTE — Assessment & Plan Note (Signed)
 BP Readings from Last 3 Encounters:  11/19/23 113/77  11/17/23 128/78  09/17/23 (!) 150/74   Stable, pt to continue medical treatment losartan 100 every day, lopressor 12.5 qd

## 2023-11-19 NOTE — Assessment & Plan Note (Signed)
 Lab Results  Component Value Date   LDLCALC 44 11/10/2023   Stable, pt to continue current statin crestor 40 qd

## 2023-11-19 NOTE — Assessment & Plan Note (Signed)
 Pt counsled to quit, pt not ready

## 2023-11-19 NOTE — Progress Notes (Signed)
 Pt instructed to use Dial Soap to pre for surgery instead of CHG soap. 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 Although RN did not see sulfa in the ingredients, patient felt better using Dial soap instead.

## 2023-11-19 NOTE — Assessment & Plan Note (Signed)
Age and sex appropriate education and counseling updated with regular exercise and diet Referrals for preventative services - none needed Immunizations addressed - none needed Smoking counseling  - pt counsled to quit, pt not ready Evidence for depression or other mood disorder - none significant Most recent labs reviewed. I have personally reviewed and have noted: 1) the patient's medical and social history 2) The patient's current medications and supplements 3) The patient's height, weight, and BMI have been recorded in the chart

## 2023-11-19 NOTE — Assessment & Plan Note (Signed)
 Pt cleared for surgury as planned mar 14

## 2023-11-19 NOTE — Assessment & Plan Note (Signed)
 Lab Results  Component Value Date   HGBA1C 6.5 11/10/2023   Stable, pt to continue current medical treatment metformin Er 500 mg - 4 every day, actos 15 every day, ozempic 2 mg weekly, jardiance 25 qd

## 2023-11-20 NOTE — Progress Notes (Signed)
 Anesthesia Chart Review:  68 year old male follows with cardiology for history of aortic stenosis, CAD (calcium score 2614), HLD, HTN, prior DVT.  Myoview 05/2023 was low risk with no ischemia or infarction, normal EF.  Echo 05/2023 showed EF 50 to 55%, grade 2 DD, normal RV function, moderate aortic stenosis with mean gradient 21 mmHg, AVA 0.99 cm.  Cardiac clearance per progress note 10/13/2023 by Edd Fabian, NP, "Chart reviewed as part of pre-operative protocol coverage. Given past medical history and time since last visit, based on ACC/AHA guidelines, Scott Valentine would be at acceptable risk for the planned procedure without further cardiovascular testing. His RCRI is moderate risk, 6.6% risk of major cardiac event.  He is able to complete greater than 4 METS of physical activity. His aspirin may be held for 5 to 7 days prior to his procedure.  Please resume as soon as hemostasis is achieved."  Other pertinent history includes current smoker, non-insulin-dependent DM2 (A1c 6.4 on 11/10/2023).  Patient reports last dose of Ozempic 11/07/2023.  CMP and CBC from 11/10/2023 reviewed, unremarkable.  EKG 05/07/2023: NSR.  Rate 60.  Nuclear stress 05/28/2023:   The study is normal. The study is low risk.   No ST deviation was noted.   LV perfusion is normal.   Left ventricular function is normal. Nuclear stress EF: 55%. The left ventricular ejection fraction is normal (55-65%). End diastolic cavity size is normal. End systolic cavity size is normal.   Prior study not available for comparison.   Normal resting and stress perfusion. No ischemia or infarction EF 55%  TTE 05/21/2023:  1. Left ventricular ejection fraction, by estimation, is 50 to 55%. Left  ventricular ejection fraction by 3D volume is 53 %. The left ventricle has  low normal function. The left ventricle has no regional wall motion  abnormalities. There is mild  concentric left ventricular hypertrophy. Left ventricular diastolic   parameters are consistent with Grade II diastolic dysfunction  (pseudonormalization). The average left ventricular global longitudinal  strain is -18.8 %. The global longitudinal strain is   normal.   2. Right ventricular systolic function is normal. The right ventricular  size is normal.   3. Left atrial size was mildly dilated.   4. The mitral valve is normal in structure. No evidence of mitral valve  regurgitation. No evidence of mitral stenosis.   5. The aortic valve is tricuspid. There is moderate calcification of the  aortic valve. There is moderate thickening of the aortic valve. Aortic  valve regurgitation is not visualized. Moderate aortic valve stenosis.  Aortic valve area, by VTI measures  0.99 cm. Aortic valve mean gradient measures 21.0 mmHg. Aortic valve Vmax  measures 3.14 m/s.   6. The inferior vena cava is normal in size with greater than 50%  respiratory variability, suggesting right atrial pressure of 3 mmHg     [name] [facility] Short Stay Center/Anesthesiology Phone [phone] 11/20/2023 8:57 AM

## 2023-11-20 NOTE — Anesthesia Preprocedure Evaluation (Addendum)
 Anesthesia Evaluation  Patient identified by MRN, date of birth, ID band Patient awake    Reviewed: Allergy & Precautions, NPO status , Patient's Chart, lab work & pertinent test results, reviewed documented beta blocker date and time   History of Anesthesia Complications Negative for: history of anesthetic complications  Airway Mallampati: II  TM Distance: >3 FB Neck ROM: Full    Dental  (+) Missing,    Pulmonary Current Smoker and Patient abstained from smoking.   Pulmonary exam normal        Cardiovascular hypertension, Pt. on medications and Pt. on home beta blockers + CAD  Normal cardiovascular exam  Echo 05/2023: EF 50-55%, grade 2 DD, normal RV function, moderate aortic stenosis with mean gradient 21 mmHg, AVA 0.99 cm   Neuro/Psych   Anxiety     negative neurological ROS     GI/Hepatic negative GI ROS, Neg liver ROS,,,  Endo/Other  diabetes (on semaglutide), Type 2, Oral Hypoglycemic Agents    Renal/GU negative Renal ROS  negative genitourinary   Musculoskeletal negative musculoskeletal ROS (+)    Abdominal   Peds  Hematology negative hematology ROS (+)   Anesthesia Other Findings Day of surgery medications reviewed with patient.  Reproductive/Obstetrics negative OB ROS                             Anesthesia Physical Anesthesia Plan  ASA: 3  Anesthesia Plan: General   Post-op Pain Management: Tylenol PO (pre-op)*   Induction: Intravenous  PONV Risk Score and Plan: 1 and Ondansetron, Dexamethasone, Treatment may vary due to age or medical condition and Midazolam  Airway Management Planned: Oral ETT  Additional Equipment: None  Intra-op Plan:   Post-operative Plan: Extubation in OR  Informed Consent: I have reviewed the patients History and Physical, chart, labs and discussed the procedure including the risks, benefits and alternatives for the proposed anesthesia with  the patient or authorized representative who has indicated his/her understanding and acceptance.     Dental advisory given  Plan Discussed with: CRNA  Anesthesia Plan Comments: (PAT note by Antionette Poles, PA-C:  68 year old male follows with cardiology for history of aortic stenosis, CAD (calcium score 2614), HLD, HTN, prior DVT.  Myoview 05/2023 was low risk with no ischemia or infarction, normal EF.  Echo 05/2023 showed EF 50 to 55%, grade 2 DD, normal RV function, moderate aortic stenosis with mean gradient 21 mmHg, AVA 0.99 cm.  Cardiac clearance per progress note 10/13/2023 by Edd Fabian, NP, "Chart reviewed as part of pre-operative protocol coverage. Given past medical history and time since last visit, based on ACC/AHA guidelines, Scott Valentine would be at acceptable risk for the planned procedure without further cardiovascular testing. His RCRI is moderate risk, 6.6% risk of major cardiac event.  He is able to complete greater than 4 METS of physical activity. His aspirin may be held for 5 to 7 days prior to his procedure.  Please resume as soon as hemostasis is achieved."  Other pertinent history includes current smoker, non-insulin-dependent DM2 (A1c 6.4 on 11/10/2023).  Patient reports last dose of Ozempic 11/07/2023.  CMP and CBC from 11/10/2023 reviewed, unremarkable.  EKG 05/07/2023: NSR.  Rate 60.  Nuclear stress 05/28/2023:   The study is normal. The study is low risk.   No ST deviation was noted.   LV perfusion is normal.   Left ventricular function is normal. Nuclear stress EF: 55%. The left ventricular ejection  fraction is normal (55-65%). End diastolic cavity size is normal. End systolic cavity size is normal.   Prior study not available for comparison.  Normal resting and stress perfusion. No ischemia or infarction EF 55%  TTE 05/21/2023: 1. Left ventricular ejection fraction, by estimation, is 50 to 55%. Left  ventricular ejection fraction by 3D volume is 53 %. The  left ventricle has  low normal function. The left ventricle has no regional wall motion  abnormalities. There is mild  concentric left ventricular hypertrophy. Left ventricular diastolic  parameters are consistent with Grade II diastolic dysfunction  (pseudonormalization). The average left ventricular global longitudinal  strain is -18.8 %. The global longitudinal strain is  normal.  2. Right ventricular systolic function is normal. The right ventricular  size is normal.  3. Left atrial size was mildly dilated.  4. The mitral valve is normal in structure. No evidence of mitral valve  regurgitation. No evidence of mitral stenosis.  5. The aortic valve is tricuspid. There is moderate calcification of the  aortic valve. There is moderate thickening of the aortic valve. Aortic  valve regurgitation is not visualized. Moderate aortic valve stenosis.  Aortic valve area, by VTI measures  0.99 cm. Aortic valve mean gradient measures 21.0 mmHg. Aortic valve Vmax  measures 3.14 m/s.  6. The inferior vena cava is normal in size with greater than 50%  respiratory variability, suggesting right atrial pressure of 3 mmHg    )        Anesthesia Quick Evaluation

## 2023-11-21 ENCOUNTER — Encounter (HOSPITAL_COMMUNITY): Payer: Self-pay | Admitting: General Surgery

## 2023-11-21 ENCOUNTER — Ambulatory Visit: Payer: PPO | Admitting: Internal Medicine

## 2023-11-21 ENCOUNTER — Encounter (HOSPITAL_COMMUNITY)
Admission: RE | Disposition: A | Discharge status: Home / Self Care | Payer: Self-pay | Source: Home / Self Care | Attending: General Surgery

## 2023-11-21 ENCOUNTER — Ambulatory Visit (HOSPITAL_COMMUNITY): Admitting: Physician Assistant

## 2023-11-21 ENCOUNTER — Other Ambulatory Visit: Payer: Self-pay

## 2023-11-21 ENCOUNTER — Ambulatory Visit (HOSPITAL_COMMUNITY)
Admission: RE | Admit: 2023-11-21 | Discharge: 2023-11-21 | Disposition: A | Discharge status: Home / Self Care | Attending: General Surgery | Admitting: General Surgery

## 2023-11-21 ENCOUNTER — Ambulatory Visit (HOSPITAL_BASED_OUTPATIENT_CLINIC_OR_DEPARTMENT_OTHER): Admitting: Anesthesiology

## 2023-11-21 DIAGNOSIS — Z86718 Personal history of other venous thrombosis and embolism: Secondary | ICD-10-CM | POA: Insufficient documentation

## 2023-11-21 DIAGNOSIS — Z7984 Long term (current) use of oral hypoglycemic drugs: Secondary | ICD-10-CM | POA: Diagnosis not present

## 2023-11-21 DIAGNOSIS — I2510 Atherosclerotic heart disease of native coronary artery without angina pectoris: Secondary | ICD-10-CM | POA: Insufficient documentation

## 2023-11-21 DIAGNOSIS — Z7985 Long-term (current) use of injectable non-insulin antidiabetic drugs: Secondary | ICD-10-CM | POA: Insufficient documentation

## 2023-11-21 DIAGNOSIS — E119 Type 2 diabetes mellitus without complications: Secondary | ICD-10-CM

## 2023-11-21 DIAGNOSIS — I10 Essential (primary) hypertension: Secondary | ICD-10-CM

## 2023-11-21 DIAGNOSIS — K429 Umbilical hernia without obstruction or gangrene: Secondary | ICD-10-CM | POA: Diagnosis not present

## 2023-11-21 DIAGNOSIS — F17210 Nicotine dependence, cigarettes, uncomplicated: Secondary | ICD-10-CM | POA: Diagnosis not present

## 2023-11-21 HISTORY — PX: UMBILICAL HERNIA REPAIR: SHX196

## 2023-11-21 LAB — GLUCOSE, CAPILLARY
Glucose-Capillary: 154 mg/dL — ABNORMAL HIGH (ref 70–99)
Glucose-Capillary: 121 mg/dL — ABNORMAL HIGH (ref 70–99)
Glucose-Capillary: 125 mg/dL — ABNORMAL HIGH (ref 70–99)

## 2023-11-21 SURGERY — REPAIR, HERNIA, UMBILICAL, ADULT
Anesthesia: General

## 2023-11-21 MED ORDER — PROPOFOL 10 MG/ML IV BOLUS
INTRAVENOUS | Status: AC
  Filled 2023-11-21: qty 20

## 2023-11-21 MED ORDER — CHLORHEXIDINE GLUCONATE CLOTH 2 % EX PADS: 6.0000 | MEDICATED_PAD | Freq: Once | CUTANEOUS | Status: DC

## 2023-11-21 MED ORDER — PROPOFOL 10 MG/ML IV BOLUS
INTRAVENOUS | Status: DC | PRN
  Administered 2023-11-21: 170 mg via INTRAVENOUS

## 2023-11-21 MED ORDER — PHENYLEPHRINE HCL-NACL 20-0.9 MG/250ML-% IV SOLN
INTRAVENOUS | Status: DC | PRN
  Administered 2023-11-21: 50 ug/min via INTRAVENOUS

## 2023-11-21 MED ORDER — SUGAMMADEX SODIUM 200 MG/2ML IV SOLN
INTRAVENOUS | Status: AC
  Filled 2023-11-21: qty 2

## 2023-11-21 MED ORDER — OXYCODONE HCL 5 MG PO TABS
ORAL_TABLET | ORAL | Status: DC
Start: 2023-11-21 — End: 2023-11-21
  Filled 2023-11-21: qty 1

## 2023-11-21 MED ORDER — BUPIVACAINE-EPINEPHRINE (PF) 0.25% -1:200000 IJ SOLN
INTRAMUSCULAR | Status: AC
  Filled 2023-11-21: qty 30

## 2023-11-21 MED ORDER — LIDOCAINE 2% (20 MG/ML) 5 ML SYRINGE
INTRAMUSCULAR | Status: AC
  Filled 2023-11-21: qty 5

## 2023-11-21 MED ORDER — ACETAMINOPHEN 500 MG PO TABS
1000.0000 mg | ORAL_TABLET | ORAL | Status: AC
  Administered 2023-11-21: 1000 mg via ORAL
  Filled 2023-11-21: qty 2

## 2023-11-21 MED ORDER — FENTANYL CITRATE (PF) 100 MCG/2ML IJ SOLN: 25.0000 ug | INTRAMUSCULAR | Status: DC | PRN

## 2023-11-21 MED ORDER — ORAL CARE MOUTH RINSE: 15.0000 mL | Freq: Once | OROMUCOSAL | Status: AC

## 2023-11-21 MED ORDER — LACTATED RINGERS IV SOLN: INTRAVENOUS | Status: DC

## 2023-11-21 MED ORDER — OXYCODONE HCL 5 MG/5ML PO SOLN: 5.0000 mg | Freq: Once | ORAL | Status: AC | PRN

## 2023-11-21 MED ORDER — INSULIN ASPART 100 UNIT/ML IJ SOLN
0.0000 [IU] | INTRAMUSCULAR | Status: DC | PRN
  Administered 2023-11-21: 2 [IU] via SUBCUTANEOUS

## 2023-11-21 MED ORDER — ONDANSETRON HCL 4 MG/2ML IJ SOLN
INTRAMUSCULAR | Status: AC
  Filled 2023-11-21: qty 2

## 2023-11-21 MED ORDER — DROPERIDOL 2.5 MG/ML IJ SOLN: 0.6250 mg | Freq: Once | INTRAMUSCULAR | Status: DC | PRN

## 2023-11-21 MED ORDER — FENTANYL CITRATE (PF) 250 MCG/5ML IJ SOLN
INTRAMUSCULAR | Status: AC
  Filled 2023-11-21: qty 5

## 2023-11-21 MED ORDER — ROCURONIUM BROMIDE 10 MG/ML (PF) SYRINGE
PREFILLED_SYRINGE | INTRAVENOUS | Status: AC
  Filled 2023-11-21: qty 10

## 2023-11-21 MED ORDER — ONDANSETRON HCL 4 MG/2ML IJ SOLN
INTRAMUSCULAR | Status: DC | PRN
  Administered 2023-11-21: 4 mg via INTRAVENOUS

## 2023-11-21 MED ORDER — INSULIN ASPART 100 UNIT/ML IJ SOLN
INTRAMUSCULAR | Status: AC
  Filled 2023-11-21: qty 1

## 2023-11-21 MED ORDER — CEFAZOLIN SODIUM-DEXTROSE 2-4 GM/100ML-% IV SOLN
2.0000 g | INTRAVENOUS | Status: AC
  Administered 2023-11-21: 2 g via INTRAVENOUS
  Filled 2023-11-21: qty 100

## 2023-11-21 MED ORDER — SUGAMMADEX SODIUM 200 MG/2ML IV SOLN
INTRAVENOUS | Status: DC | PRN
  Administered 2023-11-21: 300 mg via INTRAVENOUS

## 2023-11-21 MED ORDER — DEXAMETHASONE SODIUM PHOSPHATE 10 MG/ML IJ SOLN
INTRAMUSCULAR | Status: DC | PRN
  Administered 2023-11-21: 5 mg via INTRAVENOUS

## 2023-11-21 MED ORDER — OXYCODONE HCL 5 MG PO TABS: 5.0000 mg | ORAL_TABLET | Freq: Four times a day (QID) | ORAL | 0 refills | Status: DC | PRN

## 2023-11-21 MED ORDER — ROCURONIUM BROMIDE 10 MG/ML (PF) SYRINGE
PREFILLED_SYRINGE | INTRAVENOUS | Status: DC | PRN
  Administered 2023-11-21: 60 mg via INTRAVENOUS

## 2023-11-21 MED ORDER — DEXAMETHASONE SODIUM PHOSPHATE 10 MG/ML IJ SOLN
INTRAMUSCULAR | Status: AC
  Filled 2023-11-21: qty 1

## 2023-11-21 MED ORDER — LIDOCAINE 2% (20 MG/ML) 5 ML SYRINGE
INTRAMUSCULAR | Status: DC | PRN
  Administered 2023-11-21: 100 mg via INTRAVENOUS

## 2023-11-21 MED ORDER — MIDAZOLAM HCL 2 MG/2ML IJ SOLN
INTRAMUSCULAR | Status: DC | PRN
  Administered 2023-11-21: 2 mg via INTRAVENOUS

## 2023-11-21 MED ORDER — EPHEDRINE SULFATE-NACL 50-0.9 MG/10ML-% IV SOSY
PREFILLED_SYRINGE | INTRAVENOUS | Status: DC | PRN
  Administered 2023-11-21: 5 mg via INTRAVENOUS

## 2023-11-21 MED ORDER — FENTANYL CITRATE (PF) 250 MCG/5ML IJ SOLN
INTRAMUSCULAR | Status: DC | PRN
  Administered 2023-11-21: 100 ug via INTRAVENOUS

## 2023-11-21 MED ORDER — BUPIVACAINE-EPINEPHRINE 0.25% -1:200000 IJ SOLN
INTRAMUSCULAR | Status: DC | PRN
  Administered 2023-11-21: 10 mL

## 2023-11-21 MED ORDER — GABAPENTIN 100 MG PO CAPS
100.0000 mg | ORAL_CAPSULE | ORAL | Status: AC
  Administered 2023-11-21: 100 mg via ORAL
  Filled 2023-11-21: qty 1

## 2023-11-21 MED ORDER — OXYCODONE HCL 5 MG PO TABS
5.0000 mg | ORAL_TABLET | Freq: Once | ORAL | Status: AC | PRN
  Administered 2023-11-21: 5 mg via ORAL

## 2023-11-21 MED ORDER — CHLORHEXIDINE GLUCONATE 0.12 % MT SOLN
15.0000 mL | Freq: Once | OROMUCOSAL | Status: AC
  Administered 2023-11-21: 15 mL via OROMUCOSAL
  Filled 2023-11-21: qty 15

## 2023-11-21 MED ORDER — MIDAZOLAM HCL 2 MG/2ML IJ SOLN
INTRAMUSCULAR | Status: AC
  Filled 2023-11-21: qty 2

## 2023-11-21 SURGICAL SUPPLY — 25 items
BAG COUNTER SPONGE SURGICOUNT (BAG) ×1 IMPLANT
BLADE CLIPPER SURG (BLADE) IMPLANT
CHLORAPREP W/TINT 26 (MISCELLANEOUS) ×1 IMPLANT
COVER SURGICAL LIGHT HANDLE (MISCELLANEOUS) ×1 IMPLANT
DERMABOND ADVANCED .7 DNX12 (GAUZE/BANDAGES/DRESSINGS) ×1 IMPLANT
DRAPE LAPAROSCOPIC ABDOMINAL (DRAPES) ×1 IMPLANT
ELECT REM PT RETURN 9FT ADLT (ELECTROSURGICAL) ×1 IMPLANT
ELECTRODE REM PT RTRN 9FT ADLT (ELECTROSURGICAL) ×1 IMPLANT
GAUZE SPONGE 4X4 12PLY STRL (GAUZE/BANDAGES/DRESSINGS) IMPLANT
GLOVE BIO SURGEON STRL SZ7.5 (GLOVE) ×1 IMPLANT
GOWN STRL REUS W/ TWL LRG LVL3 (GOWN DISPOSABLE) ×2 IMPLANT
KIT BASIN OR (CUSTOM PROCEDURE TRAY) ×1 IMPLANT
KIT TURNOVER KIT B (KITS) ×1 IMPLANT
NDL HYPO 25GX1X1/2 BEV (NEEDLE) ×1 IMPLANT
NEEDLE HYPO 25GX1X1/2 BEV (NEEDLE) ×1 IMPLANT
NS IRRIG 1000ML POUR BTL (IV SOLUTION) ×1 IMPLANT
PACK GENERAL/GYN (CUSTOM PROCEDURE TRAY) ×1 IMPLANT
PAD ARMBOARD POSITIONER FOAM (MISCELLANEOUS) ×1 IMPLANT
PENCIL SMOKE EVACUATOR (MISCELLANEOUS) ×1 IMPLANT
SUT MNCRL AB 4-0 PS2 18 (SUTURE) ×1 IMPLANT
SUT NOVA NAB DX-16 0-1 5-0 T12 (SUTURE) ×1 IMPLANT
SUT VIC AB 2-0 SH 27X BRD (SUTURE) ×1 IMPLANT
SYR CONTROL 10ML LL (SYRINGE) ×1 IMPLANT
TOWEL GREEN STERILE (TOWEL DISPOSABLE) ×1 IMPLANT
TOWEL GREEN STERILE FF (TOWEL DISPOSABLE) ×1 IMPLANT

## 2023-11-21 NOTE — Transfer of Care (Signed)
 Immediate Anesthesia Transfer of Care Note  Patient: Scott Valentine  Procedure(s) Performed: OPEN UMBILICAL HERNIA REPAIR  Patient Location: PACU  Anesthesia Type:General  Level of Consciousness: awake, alert , and oriented  Airway & Oxygen Therapy: Patient Spontanous Breathing  Post-op Assessment: Report given to RN and Post -op Vital signs reviewed and stable  Post vital signs: Reviewed and stable  Last Vitals:  Vitals Value Taken Time  BP 121/67 11/21/23 1108  Temp    Pulse 57 11/21/23 1110  Resp 17 11/21/23 1110  SpO2 93 % 11/21/23 1110  Vitals shown include unfiled device data.  Last Pain:  Vitals:   11/21/23 0830  TempSrc:   PainSc: 0-No pain      Patients Stated Pain Goal: 0 (11/21/23 0830)  Complications: No notable events documented.

## 2023-11-21 NOTE — Op Note (Addendum)
 11/21/2023  10:55 AM  PATIENT:  Scott Valentine  68 y.o. male  PRE-OPERATIVE DIAGNOSIS:  UMBILICAL HERNIA  POST-OPERATIVE DIAGNOSIS:  UMBILICAL HERNIA, 1cm  PROCEDURE:  Procedure(s): OPEN UMBILICAL HERNIA REPAIR  SURGEON:  Surgeons and Role:    * Griselda Miner, MD - Primary  PHYSICIAN ASSISTANT:   ASSISTANTS: Rockwell Germany, RNFA   ANESTHESIA:   local and general  EBL:  minimal   BLOOD ADMINISTERED:none  DRAINS: none   LOCAL MEDICATIONS USED:  MARCAINE     SPECIMEN:  No Specimen  DISPOSITION OF SPECIMEN:  N/A  COUNTS:  YES  TOURNIQUET:  * No tourniquets in log *  DICTATION: .Dragon Dictation  After informed consent was obtained the patient was brought to the operating room and placed in the supine position on the operating table.  After adequate induction of general anesthesia the patient's abdomen was prepped with ChloraPrep, allowed to dry, and draped in usual sterile manner.  An appropriate timeout was performed.  The area around the umbilicus was infiltrated with quarter percent Marcaine.  A small transversely oriented incision was made at the lower edge of the umbilicus with a 15 blade knife.  The incision was carried through the skin and subcutaneous tissue sharply with the electrocautery until the fascia of the anterior abdominal wall was encountered.  The hernia sac was identified.  It was dissected free by blunt hemostat dissection.  The sac was opened and there was only some preperitoneal fat within the sac.  Much of the sac was excised sharply with the electrocautery.  Once this was accomplished the fascial edges were identified and appeared to be healthy.  The fascial defect was not large enough to get my finger through the opening.  Because of this I elected to repair the hernia defect primarily with interrupted #1 Novafil stitches.  Once this was accomplished the hernia seem well repaired.  The wound was irrigated with copious amounts of saline.  The umbilicus  was tacked back to the fascia with interrupted 2-0 Vicryl stitches.  The subcutaneous tissue was closed with interrupted 2-0 Vicryl stitches.  The skin was then closed with a running 4-0 Monocryl subcuticular stitch.  Dermabond dressings were applied.  The patient tolerated the procedure well.  At the end of the case all needle sponge and instrument counts were correct.  The patient was then awakened and taken to recovery in stable condition.  The hernia defect measured 1 cm.  PLAN OF CARE: Discharge to home after PACU  PATIENT DISPOSITION:  PACU - hemodynamically stable.   Delay start of Pharmacological VTE agent (>24hrs) due to surgical blood loss or risk of bleeding: not applicable

## 2023-11-21 NOTE — H&P (Signed)
 REFERRING PHYSICIAN: Room, Emergency PROVIDER: Lindell Noe, MD MRN: [id number] DOB: [birth year] Subjective   Chief Complaint: New Consultation (hernia)  History of Present Illness: Scott Valentine is a 68 y.o. male who is seen today as an office consultation for evaluation of New Consultation (hernia)  We are asked to see the patient in consultation by Dr. Oliver Barre to evaluate him for an umbilical hernia. 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 He states that he has had a bulge at his umbilicus and above his umbilicus for a number of years. He recently had some significant tenderness associated with the umbilicus. He denied any fevers or chills. He denied any nausea or vomiting. His appetite is good. he does have some diarrhea which she attributes to Ozempic. He also has diabetes and smokes a pack of cigarettes a day. He is followed by Dr. Cristal Deer but recently had a stress test that he says was normal  Review of Systems: A complete review of systems was obtained from the patient. I have reviewed this information and discussed as appropriate with the patient. See HPI as well for other ROS.  ROS   Medical History: Past Medical History:  Diagnosis Date  Diabetes mellitus without complication (CMS/HHS-HCC)  DVT (deep venous thrombosis) (CMS/HHS-HCC)  Hyperlipidemia  Hypertension   Patient Active Problem List  Diagnosis  Umbilical hernia without obstruction or gangrene   Past Surgical History:  Procedure Laterality Date  finger surgery  hemmoroid surgery  spinal surgery    Allergies  Allergen Reactions  Sulfa (Sulfonamide Antibiotics) Other (See Comments)  Trudie Buckler Syndrome  Atorvastatin Other (See Comments)  myalgias  Ezetimibe Other (See Comments)  myalgias   Current Outpatient Medications on File Prior to Visit  Medication Sig Dispense Refill  ascorbic acid, vitamin C, (VITAMIN C) 500 MG tablet  Take 500 mg by mouth once daily  aspirin 81 MG EC tablet Take 81 mg by mouth once daily  escitalopram oxalate (LEXAPRO) 20 MG tablet Take 1 tablet by mouth once daily  fexofenadine (ALLEGRA) 180 MG tablet  FOLIC ACID ORAL Take by mouth  JARDIANCE 25 mg tablet Take 1 tablet by mouth once daily  losartan (COZAAR) 100 MG tablet Take 1 tablet by mouth once daily  magnesium oxide (MAG-OX) 400 mg (241.3 mg magnesium) tablet Take 400 mg by mouth once daily  metFORMIN (GLUCOPHAGE-XR) 500 MG XR tablet TAKE 4 TABLETS BY MOUTH ONCE DAILY WITH BREAKFAST  metoprolol TARTrate (LOPRESSOR) 25 MG tablet Take 0.5 tablets by mouth once daily  multivitamin tablet Take 1 tablet by mouth once daily  pioglitazone (ACTOS) 15 MG tablet Take 1 tablet by mouth once daily  rosuvastatin (CRESTOR) 40 MG tablet Take 1 tablet by mouth once daily  semaglutide (OZEMPIC) 2 mg/dose (8 mg/3 mL) pen injector Inject 2 mg as directed  sildenafiL (VIAGRA) 100 MG tablet Take 1 tablet by mouth once daily as needed   No current facility-administered medications on file prior to visit.   Family History  Problem Relation Age of Onset  Coronary Artery Disease (Blocked arteries around heart) Mother  Diabetes Mother  Coronary Artery Disease (Blocked arteries around heart) Father    Social History   Tobacco Use  Smoking Status Every Day  Types: Cigarettes  Smokeless Tobacco Never    Social History   Socioeconomic History  Marital status: Married  Tobacco Use  Smoking status: Every Day  Types: Cigarettes  Smokeless tobacco: Never  Vaping Use  Vaping status: Never Used  Substance and Sexual Activity  Alcohol use: Not Currently  Drug use: Never   Social Drivers of Health   Financial Resource Strain: Low Risk (09/25/2023)  Received from [facility]  Overall Financial Resource Strain (CARDIA)  Difficulty of Paying Living Expenses: Not hard at all  Food Insecurity: No Food Insecurity (09/25/2023)  [facility]  Hunger Vital Sign  Worried About Running Out of Food in the Last Year: Never true  Ran Out of Food in the Last Year: Never true  Transportation Needs: No Transportation Needs (09/25/2023)  Received from [facility] - Transportation  Lack of Transportation (Medical): No  Lack of Transportation (Non-Medical): No  Physical Activity: Inactive (09/17/2023)  Received from [facility]  Exercise Vital Sign  Days of Exercise per Week: 0 days  Minutes of Exercise per Session: 0 min  Stress: No Stress Concern Present (09/17/2023)  Received from [facility] - Occupational Stress Questionnaire  Feeling of Stress : Not at all  Social Connections: Socially Isolated (09/17/2023)  Received from [facility]  Social Connection and Isolation Panel [NHANES]  Frequency of Communication with Friends and Family: Never  Frequency of Social Gatherings with Friends and Family: Once a week  Attends Religious Services: Never  Database administrator or Organizations: No  Marital Status: Married  Housing Stability: Unknown (09/30/2023)  Housing Stability Vital Sign  Homeless in the Last Year: No   Objective:   Vitals:  BP: (!) 144/86  Pulse: 62  Temp: 36.4 C (97.6 F)  SpO2: 98%  Weight: 90.4 kg (199 lb 6.4 oz)  Height: 175.3 cm (5\' 9" )  PainSc: 0-No pain   Body mass index is 29.45 kg/m.  Physical Exam Constitutional:  General: He is not in acute distress. Appearance: Normal appearance.  HENT:  Head: Normocephalic and atraumatic.  Right Ear: External ear normal.  Left Ear: External ear normal.  Nose: Nose normal.  Mouth/Throat:  Mouth: Mucous membranes are moist.  Pharynx: Oropharynx is clear.  Eyes:  General: No scleral icterus. Extraocular Movements: Extraocular movements intact.  Conjunctiva/sclera: Conjunctivae normal.  Pupils: Pupils are equal, round, and reactive to light.  Cardiovascular:  Rate and Rhythm: Normal rate and  regular rhythm.  Pulses: Normal pulses.  Heart sounds: Murmur heard.  Pulmonary:  Effort: Pulmonary effort is normal. No respiratory distress.  Breath sounds: Normal breath sounds.  Abdominal:  General: Abdomen is flat. Bowel sounds are normal. There is no distension.  Palpations: Abdomen is soft.  Tenderness: There is no abdominal tenderness.  Comments: There is an incarcerated umbilical hernia likely containing fat with no sign of obstruction. He also has a normal rectus diastases along the upper abdomen  Musculoskeletal:  General: No swelling or deformity. Normal range of motion.  Cervical back: Normal range of motion and neck supple. No tenderness.  Skin: General: Skin is warm and dry.  Coloration: Skin is not jaundiced.  Neurological:  General: No focal deficit present.  Mental Status: He is alert and oriented to person, place, and time.  Psychiatric:  Mood and Affect: Mood normal.  Behavior: Behavior normal.     Labs, Imaging and Diagnostic Testing:  Assessment and Plan:   Diagnoses and all orders for this visit:  Umbilical hernia without obstruction or gangrene - [facility] Case Posting Request; Future   The patient appears to have an umbilical hernia that has some incarcerated fat within it. Because of  the risk of incarceration and strangulation involving the bowel I feel he would benefit from having the hernia fixed. He would also like to have this done. I have discussed with him in detail the risks and benefits of the operation as well as some of the technical aspects including the possible use of mesh and he understands and wishes to proceed. He also understands that he is at high risk of a complication because of the diabetes and smoking. We will obtain cardiac clearance from Dr. Cristal Deer and once we have that we will proceed with surgical scheduling.

## 2023-11-21 NOTE — Anesthesia Procedure Notes (Signed)
 Procedure Name: Intubation Date/Time: 11/21/2023 10:18 AM  Performed by: Sudie Grumbling, CRNAPre-anesthesia Checklist: Patient identified, Emergency Drugs available, Suction available and Patient being monitored Patient Re-evaluated:Patient Re-evaluated prior to induction Oxygen Delivery Method: Circle system utilized Preoxygenation: Pre-oxygenation with 100% oxygen Induction Type: IV induction Ventilation: Mask ventilation without difficulty Laryngoscope Size: Mac and 4 Grade View: Grade I Tube type: Oral Tube size: 7.5 mm Number of attempts: 1 Airway Equipment and Method: Stylet and Oral airway Placement Confirmation: ETT inserted through vocal cords under direct vision, positive ETCO2 and breath sounds checked- equal and bilateral Secured at: 22 cm Tube secured with: Tape Dental Injury: Teeth and Oropharynx as per pre-operative assessment

## 2023-11-21 NOTE — Interval H&P Note (Signed)
 History and Physical Interval Note:  11/21/2023 9:44 AM  Scott Valentine  has presented today for surgery, with the diagnosis of UMBILICAL HERNIA.  The various methods of treatment have been discussed with the patient and family. After consideration of risks, benefits and other options for treatment, the patient has consented to  Procedure(s): OPEN UMBILICAL HERNIA REPAIR WITH POSSIBLE MESH (N/A) as a surgical intervention.  The patient's history has been reviewed, patient examined, no change in status, stable for surgery.  I have reviewed the patient's chart and labs.  Questions were answered to the patient's satisfaction.     [name]

## 2023-11-21 NOTE — Anesthesia Postprocedure Evaluation (Signed)
 Anesthesia Post Note  Patient: Scott Valentine  Procedure(s) Performed: OPEN UMBILICAL HERNIA REPAIR     Patient location during evaluation: PACU Anesthesia Type: General Level of consciousness: awake and alert Pain management: pain level controlled Vital Signs Assessment: post-procedure vital signs reviewed and stable Respiratory status: spontaneous breathing, nonlabored ventilation and respiratory function stable Cardiovascular status: blood pressure returned to baseline Postop Assessment: no apparent nausea or vomiting Anesthetic complications: no   No notable events documented.  Last Vitals:  Vitals:   11/21/23 1145 11/21/23 1200  BP: 100/69 111/74  Pulse: (!) 54 (!) 56  Resp: 13 17  Temp:    SpO2: 93% 96%    Last Pain:  Vitals:   11/21/23 1115  TempSrc:   PainSc: 2                  Shanda Howells

## 2023-11-22 ENCOUNTER — Encounter (HOSPITAL_COMMUNITY): Payer: Self-pay | Admitting: General Surgery

## 2023-12-08 ENCOUNTER — Other Ambulatory Visit: Payer: Self-pay | Admitting: Internal Medicine

## 2023-12-11 ENCOUNTER — Telehealth: Payer: Self-pay | Admitting: Internal Medicine

## 2023-12-11 NOTE — Telephone Encounter (Signed)
 Copied from CRM [id number]. Topic: Clinical - Medication Question >> Dec 11, 2023  9:30 AM Melissa C wrote: Reason for CRM: patient stated that he has a prescription for Ozempic that gets shipped to the office where he comes and picks it up. He got notification that it was shipped but hasn't received notification that it is in the office for him. He was checking to see if it was there. Please advise with patient. Thank you.

## 2023-12-11 NOTE — Telephone Encounter (Signed)
 Will call Pt once it arrives in office.

## 2023-12-12 ENCOUNTER — Telehealth: Payer: Self-pay

## 2023-12-12 ENCOUNTER — Ambulatory Visit

## 2023-12-12 VITALS — Ht 69.0 in | Wt 201.0 lb

## 2023-12-12 DIAGNOSIS — Z0000 Encounter for general adult medical examination without abnormal findings: Secondary | ICD-10-CM

## 2023-12-12 NOTE — Patient Instructions (Addendum)
 Mr. Scott Valentine , Thank you for taking time to come for your Medicare Wellness Visit. I appreciate your ongoing commitment to your health goals. Please review the following plan we discussed and let me know if I can assist you in the future.   Referrals/Orders/Follow-Ups/Clinician Recommendations: Aim for 30 minutes of exercise or brisk walking, 6-8 glasses of water, and 5 servings of fruits and vegetables each day. Lung Cancer Screening is scheduled.  This is a list of the screening recommended for you and due dates:  Health Maintenance  Topic Date Due   COVID-19 Vaccine (7 - 2024-25 season) 05/11/2023   Flu Shot  04/09/2024   Hemoglobin A1C  05/12/2024   Eye exam for diabetics  05/15/2024   Screening for Lung Cancer  07/06/2024   Yearly kidney function blood test for diabetes  11/09/2024   Yearly kidney health urinalysis for diabetes  11/09/2024   Complete foot exam   11/16/2024   Medicare Annual Wellness Visit  12/11/2024   Colon Cancer Screening  12/12/2024   DTaP/Tdap/Td vaccine (4 - Td or Tdap) 04/12/2027   Pneumonia Vaccine  Completed   Hepatitis C Screening  Completed   HIV Screening  Completed   Zoster (Shingles) Vaccine  Completed   HPV Vaccine  Aged Out    Advanced directives: (In Chart) A copy of your advanced directives are scanned into your chart should your provider ever need it.  Next Medicare Annual Wellness Visit scheduled for next year: Yes

## 2023-12-12 NOTE — Telephone Encounter (Signed)
 Called and let Pt know Ozempic has arrived in the office and is ready for pick up, it has been placed in the nurse station fridge.

## 2023-12-12 NOTE — Progress Notes (Signed)
 Subjective:   Scott Valentine is a 68 y.o. who presents for a Medicare Wellness preventive visit.  Visit Complete: Virtual I connected with  Scott Valentine on 12/12/23 by a video and audio enabled telemedicine application and verified that I am speaking with the correct person using two identifiers.  Patient Location: Home  Provider Location: Office/Clinic  I discussed the limitations of evaluation and management by telemedicine. The patient expressed understanding and agreed to proceed.  Vital Signs: Because this visit was a virtual/telehealth visit, some criteria may be missing or patient reported. Any vitals not documented were not able to be obtained and vitals that have been documented are patient reported.  Persons Participating in Visit: Patient.  AWV Questionnaire: No: Patient Medicare AWV questionnaire was not completed prior to this visit.  Cardiac Risk Factors include: advanced age (>45men, >68 women);diabetes mellitus;dyslipidemia;male gender;hypertension     Objective:    Today's Vitals   12/12/23 0858  Weight: 201 lb (91.2 kg)  Height: 5\' 9"  (1.753 m)   Body mass index is 29.68 kg/m.     12/12/2023    8:57 AM 11/21/2023    8:31 AM 11/19/2023    9:24 AM 09/17/2023    3:36 PM 08/18/2019    9:19 AM 08/13/2019   10:54 AM 10/22/2018    8:00 AM  Advanced Directives  Does Patient Have a Medical Advance Directive? Yes No No No No No No  Type of Estate agent of [place];Living will        Does patient want to make changes to medical advance directive? No - Patient declined        Copy of Healthcare Power of Attorney in Chart? Yes - validated most recent copy scanned in chart (See row information)        Would patient like information on creating a medical advance directive? No - Patient declined No - Patient declined No - Patient declined  No - Patient declined No - Patient declined No - Patient declined    Current Medications  (verified) Outpatient Encounter Medications as of 12/12/2023  Medication Sig   aspirin EC 81 MG tablet Take 81 mg by mouth daily.   clonazePAM (KLONOPIN) 0.5 MG tablet Take 1 tablet by mouth twice daily as needed for anxiety   escitalopram (LEXAPRO) 20 MG tablet Take 1 tablet by mouth once daily   fexofenadine (ALLEGRA) 180 MG tablet Take 1 tablet (180 mg total) by mouth daily as needed. (Patient taking differently: Take 180 mg by mouth daily.)   folic acid (FOLVITE) 1 MG tablet Take 1 tablet (1 mg total) by mouth daily.   JARDIANCE 25 MG TABS tablet Take 1 tablet by mouth once daily   losartan (COZAAR) 100 MG tablet Take 1 tablet by mouth once daily   magnesium oxide (MAG-OX) 400 MG tablet Take 400 mg by mouth daily.   metFORMIN (GLUCOPHAGE-XR) 500 MG 24 hr tablet TAKE 4 TABLETS BY MOUTH ONCE DAILY WITH BREAKFAST   metoprolol tartrate (LOPRESSOR) 25 MG tablet Take 1/2 (one-half) tablet by mouth once daily   Multiple Vitamin (MULTIVITAMIN) tablet Take 1 tablet by mouth daily.   pioglitazone (ACTOS) 15 MG tablet Take 1 tablet by mouth once daily   rosuvastatin (CRESTOR) 40 MG tablet Take 1 tablet by mouth once daily   Semaglutide, 2 MG/DOSE, 8 MG/3ML SOPN Inject 2 mg as directed once a week.   sildenafil (VIAGRA) 100 MG tablet TAKE 1 TABLET BY MOUTH ONCE DAILY AS NEEDED  FOR ERECTILE DYSFUNCTION   vitamin C (ASCORBIC ACID) 500 MG tablet Take 500 mg by mouth 2 (two) times daily.   oxyCODONE (ROXICODONE) 5 MG immediate release tablet Take 1 tablet (5 mg total) by mouth every 6 (six) hours as needed for severe pain (pain score 7-10). (Patient not taking: Reported on 12/12/2023)   No facility-administered encounter medications on file as of 12/12/2023.    Allergies (verified) Sulfonamide derivatives   History: Past Medical History:  Diagnosis Date   Allergy    Carpal tunnel syndrome    Cervical radiculopathy    numbness in finger tips after cervical surgery    Clotting disorder (HCC)    DVT -  secondary vein left leg    Coronary artery disease    Cough 11/08/2009   Diabetes mellitus without complication (HCC)    DVT (deep venous thrombosis) (HCC) 2020   right leg, dissolved itself   Dysmetabolic syndrome X 10/08/2007   GLUCOSE INTOLERANCE 10/19/2008   Heart murmur    HYPERLIPIDEMIA 10/08/2007   HYPERPLASIA PROSTATE UNS W/O UR OBST & OTH LUTS 10/08/2007   Impaired glucose tolerance 12/30/2010   Other and unspecified hyperlipidemia 05/01/2007   Rectal bleeding    STEVENS-JOHNSON SYNDROME, 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 [facility]   HEMORRHOID SURGERY  04/25/2011   POLYPECTOMY     POSTERIOR CERVICAL LAMINECTOMY Right 10/21/2018   Procedure: Right Cervical five-six Cervical six-seven Posterior cervical foraminotomies;  Surgeon: Jadene Pierini, MD;  Location: [facility] OR;  Service: Neurosurgery;  Laterality: Right;   UMBILICAL HERNIA REPAIR N/A 11/21/2023   Procedure: OPEN UMBILICAL HERNIA REPAIR;  Surgeon: Griselda Miner, MD;  Location: [facility];  Service: General;  Laterality: N/A;   Family History  Problem Relation Age of Onset   Diabetes Mother    Mental illness Mother    Heart disease Father        MI x 2   Coronary artery disease Father 77   Lung cancer Father        pneumonectomy   Heart disease Brother        heart mumur   Heart attack Paternal Grandfather    Prostate cancer Paternal Grandfather    Colon cancer Neg Hx    Colon  polyps Neg Hx    Esophageal cancer Neg Hx    Stomach cancer Neg Hx    Rectal cancer Neg Hx    Social History   Socioeconomic History   Marital status: Married    Spouse name: Not on file   Number of children: 3   Years of education: Not on file   Highest education level: Some college, no degree  Occupational History   Occupation: [employer]  Tobacco Use   Smoking status: Every Day    Current packs/day: 1.00    Average packs/day: 1 pack/day for 49.3 years (49.3 ttl pk-yrs)    Types: Cigarettes  Start date: 09/09/1974    Passive exposure: Current   Smokeless tobacco: Never  Vaping Use   Vaping status: Never Used  Substance and Sexual Activity   Alcohol use: No    Alcohol/week: 0.0 standard drinks of alcohol   Drug use: No   Sexual activity: Yes  Other Topics Concern   Not on file  Social History Narrative   Not on file   Social Drivers of Health   Financial Resource Strain: Low Risk  (12/12/2023)   Overall Financial Resource Strain (CARDIA)    Difficulty of Paying Living Expenses: Not hard at all  Food Insecurity: No Food Insecurity (12/12/2023)   Hunger Vital Sign    Worried About Running Out of Food in the Last Year: Never true    Ran Out of Food in the Last Year: Never true  Transportation Needs: No Transportation Needs (12/12/2023)   PRAPARE - Administrator, Civil Service (Medical): No    Lack of Transportation (Non-Medical): No  Physical Activity: Inactive (12/12/2023)   Exercise Vital Sign    Days of Exercise per Week: 0 days    Minutes of Exercise per Session: 0 min  Stress: No Stress Concern Present (12/12/2023)   [employer] of Occupational Health - Occupational Stress Questionnaire    Feeling of Stress : Not at all  Social Connections: Moderately Isolated (12/12/2023)   Social Connection and Isolation Panel [NHANES]    Frequency of Communication with Friends and Family: More than three times a week    Frequency of Social Gatherings with  Friends and Family: More than three times a week    Attends Religious Services: Never    Database administrator or Organizations: No    Attends Engineer, structural: Never    Marital Status: Married    Tobacco Counseling Ready to quit: No Counseling given: No    Clinical Intake:  Pre-visit preparation completed: Yes  Pain : No/denies pain     BMI - recorded: 29.68 Nutritional Risks: None Diabetes: Yes CBG done?: No Did pt. bring in CBG monitor from home?: No  Lab Results  Component Value Date   HGBA1C 6.5 11/10/2023   HGBA1C 6.6 (H) 05/13/2023   HGBA1C 6.5 11/08/2022     How often do you need to have someone help you when you read instructions, pamphlets, or other written materials from your doctor or pharmacy?: 1 - Never  Interpreter Needed?: No  Information entered by :: Hassell Halim, CMA   Activities of Daily Living     12/12/2023    9:02 AM 11/21/2023    8:31 AM  In your present state of health, do you have any difficulty performing the following activities:  Hearing? 0 0  Vision? 0 0  Difficulty concentrating or making decisions? 0 0  Walking or climbing stairs? 0   Dressing or bathing? 0   Doing errands, shopping? 0   Preparing Food and eating ? N   Using the Toilet? N   In the past six months, have you accidently leaked urine? N   Do you have problems with loss of bowel control? N   Managing your Medications? N   Managing your Finances? N   Housekeeping or managing your Housekeeping? N     Patient Care Team: Corwin Levins, MD as PCP - General Jodelle Red, MD as PCP - Cardiology (Cardiology) Adam Phenix, DPM as Consulting Physician (Podiatry)  Indicate any recent Medical Services you may have received from  other than [facility] providers in the past year (date may be approximate).     Assessment:   This is a routine wellness examination for [place].  Hearing/Vision screen Hearing Screening - Comments:: Denies hearing  difficulties   Vision Screening - Comments:: Wears rx glasses - up to date with routine eye exams with Dr Nedra Hai   Goals Addressed               This Visit's Progress     Patient Stated (pt-stated)        Patient stated that he plans to continue being active since having hernia surgery.         Depression Screen     12/12/2023    9:07 AM 11/17/2023    8:36 AM 05/20/2023    9:02 AM 11/15/2022    8:56 AM 05/15/2022    9:01 AM 11/06/2021    9:38 AM 11/06/2021    9:07 AM  PHQ 2/9 Scores  PHQ - 2 Score 0 0 0 0  0 0  PHQ- 9 Score 1   2     Exception Documentation     Patient refusal      Fall Risk     12/12/2023    9:08 AM 11/17/2023    8:44 AM 05/20/2023    9:02 AM 11/15/2022    8:56 AM 05/15/2022    9:01 AM  Fall Risk   Falls in the past year? 0 0 0 0 0  Number falls in past yr: 0 0 0 0   Injury with Fall? 0 0 0 0   Risk for fall due to : No Fall Risks No Fall Risks No Fall Risks No Fall Risks No Fall Risks  Follow up Falls prevention discussed;Falls evaluation completed Falls evaluation completed Falls evaluation completed Falls evaluation completed;Education provided Falls evaluation completed    MEDICARE RISK AT HOME:  Medicare Risk at Home Any stairs in or around the home?: No If so, are there any without handrails?: No Home free of loose throw rugs in walkways, pet beds, electrical cords, etc?: Yes Adequate lighting in your home to reduce risk of falls?: Yes Life alert?: No Use of a cane, walker or w/c?: No Grab bars in the bathroom?: Yes Shower chair or bench in shower?: Yes Elevated toilet seat or a handicapped toilet?: Yes  TIMED UP AND GO:  Was the test performed?  No  Cognitive Function: 6CIT completed        12/12/2023    9:09 AM  6CIT Screen  What Year? 0 points  What month? 0 points  What time? 0 points  Count back from 20 0 points  Months in reverse 0 points  Repeat phrase 0 points  Total Score 0 points    Immunizations Immunization History   Administered Date(s) Administered   Fluad Trivalent(High Dose 65+) 05/20/2023   H1N1 06/23/2008   Influenza Whole 07/12/2002, 07/11/2008   Influenza,inj,Quad PF,6+ Mos 07/07/2013, 07/10/2017, 07/13/2018, 06/30/2019, 05/08/2021, 05/15/2022   Influenza-Unspecified 05/24/2015   Moderna Sars-Covid-2 Vaccination 10/20/2019, 11/17/2019, 07/27/2020, 01/04/2021, 08/09/2021   PFIZER(Purple Top)SARS-COV-2 Vaccination 07/22/2022   PNEUMOCOCCAL CONJUGATE-20 05/20/2023   Pneumococcal Conjugate-13 04/12/2016, 04/11/2017   Pneumococcal Polysaccharide-23 10/10/2017   Respiratory Syncytial Virus Vaccine,Recomb Aduvanted(Arexvy) 07/15/2022   Td 11/08/1998, 06/01/2007   Tdap 04/11/2017   Zoster Recombinant(Shingrix) 07/13/2018, 10/04/2018    Screening Tests Health Maintenance  Topic Date Due   COVID-19 Vaccine (7 - 2024-25 season) 05/11/2023   INFLUENZA VACCINE  04/09/2024   HEMOGLOBIN  A1C  05/12/2024   OPHTHALMOLOGY EXAM  05/15/2024   Lung Cancer Screening  07/06/2024   Diabetic kidney evaluation - eGFR measurement  11/09/2024   Diabetic kidney evaluation - Urine ACR  11/09/2024   FOOT EXAM  11/16/2024   Medicare Annual Wellness (AWV)  12/11/2024   Colonoscopy  12/12/2024   DTaP/Tdap/Td (4 - Td or Tdap) 04/12/2027   Pneumonia Vaccine 66+ Years old  Completed   Hepatitis C Screening  Completed   HIV Screening  Completed   Zoster Vaccines- Shingrix  Completed   HPV VACCINES  Aged Out    Health Maintenance  Health Maintenance Due  Topic Date Due   COVID-19 Vaccine (7 - 2024-25 season) 05/11/2023   Health Maintenance Items Addressed: 12/12/2023  Lung Cancer Screening status: Pt stated is scheduled.  Additional Screening:  Vision Screening: Recommended annual ophthalmology exams for early detection of glaucoma and other disorders of the eye.  Pt stated he sees Dr Nedra Hai w/[facility] in [place], [place] annually.  Dental Screening: Recommended annual dental exams for proper oral  hygiene  Community Resource Referral / Chronic Care Management: CRR required this visit?  No   CCM required this visit?  No     Plan:     I have personally reviewed and noted the following in the patient's chart:   Medical and social history Use of alcohol, tobacco or illicit drugs  Current medications and supplements including opioid prescriptions. Patient is not currently taking opioid prescriptions. Functional ability and status Nutritional status Physical activity Advanced directives List of other physicians Hospitalizations, surgeries, and ER visits in previous 12 months Vitals Screenings to include cognitive, depression, and falls Referrals and appointments  In addition, I have reviewed and discussed with patient certain preventive protocols, quality metrics, and best practice recommendations. A written personalized care plan for preventive services as well as general preventive health recommendations were provided to patient.     Darreld Mclean, CMA   12/12/2023   After Visit Summary: (MyChart) Due to this being a telephonic visit, the after visit summary with patients personalized plan was offered to patient via MyChart   Notes: Nothing significant to report at this time.

## 2023-12-16 DIAGNOSIS — K429 Umbilical hernia without obstruction or gangrene: Secondary | ICD-10-CM | POA: Diagnosis not present

## 2023-12-23 DIAGNOSIS — M79675 Pain in left toe(s): Secondary | ICD-10-CM | POA: Diagnosis not present

## 2023-12-23 DIAGNOSIS — L84 Corns and callosities: Secondary | ICD-10-CM | POA: Diagnosis not present

## 2023-12-23 DIAGNOSIS — E1142 Type 2 diabetes mellitus with diabetic polyneuropathy: Secondary | ICD-10-CM | POA: Diagnosis not present

## 2023-12-23 DIAGNOSIS — B351 Tinea unguium: Secondary | ICD-10-CM | POA: Diagnosis not present

## 2023-12-23 DIAGNOSIS — M79674 Pain in right toe(s): Secondary | ICD-10-CM | POA: Diagnosis not present

## 2024-01-19 ENCOUNTER — Other Ambulatory Visit: Payer: Self-pay | Admitting: Internal Medicine

## 2024-02-15 ENCOUNTER — Other Ambulatory Visit: Payer: Self-pay | Admitting: Internal Medicine

## 2024-02-21 ENCOUNTER — Other Ambulatory Visit: Payer: Self-pay | Admitting: Internal Medicine

## 2024-03-23 DIAGNOSIS — B351 Tinea unguium: Secondary | ICD-10-CM | POA: Diagnosis not present

## 2024-03-23 DIAGNOSIS — E1142 Type 2 diabetes mellitus with diabetic polyneuropathy: Secondary | ICD-10-CM | POA: Diagnosis not present

## 2024-03-23 DIAGNOSIS — M79675 Pain in left toe(s): Secondary | ICD-10-CM | POA: Diagnosis not present

## 2024-03-23 DIAGNOSIS — M79674 Pain in right toe(s): Secondary | ICD-10-CM | POA: Diagnosis not present

## 2024-03-23 DIAGNOSIS — L84 Corns and callosities: Secondary | ICD-10-CM | POA: Diagnosis not present

## 2024-04-22 ENCOUNTER — Other Ambulatory Visit: Payer: Self-pay | Admitting: Internal Medicine

## 2024-04-28 ENCOUNTER — Other Ambulatory Visit: Payer: Self-pay | Admitting: Internal Medicine

## 2024-04-30 ENCOUNTER — Telehealth: Payer: Self-pay | Admitting: Internal Medicine

## 2024-04-30 NOTE — Telephone Encounter (Signed)
 Copied from CRM [id number]. Topic: General - Other >> Apr 30, 2024 10:36 AM [place] E wrote: Reason for CRM: Patient stated his Semaglutide , 2 MG/DOSE, 8 MG/3ML SOPN medication gets delivered to the office for him to pick up, patient would like a phone call if/when this medication is ready for pick up. Callback number [phone].

## 2024-05-03 NOTE — Telephone Encounter (Signed)
 Called and spoke with patient informing him that patient assistance medication had made it into the office and are ready for pick up

## 2024-05-11 ENCOUNTER — Other Ambulatory Visit (INDEPENDENT_AMBULATORY_CARE_PROVIDER_SITE_OTHER): Payer: PPO

## 2024-05-11 ENCOUNTER — Other Ambulatory Visit: Payer: PPO

## 2024-05-11 DIAGNOSIS — E1165 Type 2 diabetes mellitus with hyperglycemia: Secondary | ICD-10-CM

## 2024-05-11 DIAGNOSIS — E559 Vitamin D deficiency, unspecified: Secondary | ICD-10-CM

## 2024-05-11 LAB — HEMOGLOBIN A1C: Hgb A1c MFr Bld: 6.7 % — ABNORMAL HIGH (ref 4.6–6.5)

## 2024-05-11 LAB — LIPID PANEL
Cholesterol: 97 mg/dL (ref 0–200)
HDL: 33.3 mg/dL — ABNORMAL LOW (ref >39.00)
LDL Cholesterol: 38 mg/dL (ref 0–99)
NonHDL: 63.54
Total CHOL/HDL Ratio: 3
Triglycerides: 126 mg/dL (ref 0.0–149.0)
VLDL: 25.2 mg/dL (ref 0.0–40.0)

## 2024-05-11 LAB — HEPATIC FUNCTION PANEL
ALT: 17 U/L (ref 0–53)
AST: 21 U/L (ref 0–37)
Albumin: 4.1 g/dL (ref 3.5–5.2)
Alkaline Phosphatase: 48 U/L (ref 39–117)
Bilirubin, Direct: 0.1 mg/dL (ref 0.0–0.3)
Total Bilirubin: 0.6 mg/dL (ref 0.2–1.2)
Total Protein: 7.3 g/dL (ref 6.0–8.3)

## 2024-05-11 LAB — BASIC METABOLIC PANEL WITH GFR
BUN: 10 mg/dL (ref 6–23)
CO2: 23 meq/L (ref 19–32)
Calcium: 9 mg/dL (ref 8.4–10.5)
Chloride: 102 meq/L (ref 96–112)
Creatinine, Ser: 0.83 mg/dL (ref 0.40–1.50)
GFR: 91.28 mL/min (ref >60.00)
Glucose, Bld: 110 mg/dL — ABNORMAL HIGH (ref 70–99)
Potassium: 3.9 meq/L (ref 3.5–5.1)
Sodium: 137 meq/L (ref 135–145)

## 2024-05-11 LAB — VITAMIN D 25 HYDROXY (VIT D DEFICIENCY, FRACTURES): VITD: 69.29 ng/mL (ref 30.00–100.00)

## 2024-05-19 ENCOUNTER — Encounter: Payer: Self-pay | Admitting: Internal Medicine

## 2024-05-19 ENCOUNTER — Ambulatory Visit: Payer: PPO | Admitting: Internal Medicine

## 2024-05-19 VITALS — BP 110/68 | HR 58 | Temp 98.3°F | Ht 69.0 in | Wt 188.4 lb

## 2024-05-19 DIAGNOSIS — E559 Vitamin D deficiency, unspecified: Secondary | ICD-10-CM

## 2024-05-19 DIAGNOSIS — E538 Deficiency of other specified B group vitamins: Secondary | ICD-10-CM | POA: Diagnosis not present

## 2024-05-19 DIAGNOSIS — E1165 Type 2 diabetes mellitus with hyperglycemia: Secondary | ICD-10-CM

## 2024-05-19 DIAGNOSIS — Z7985 Long-term (current) use of injectable non-insulin antidiabetic drugs: Secondary | ICD-10-CM

## 2024-05-19 DIAGNOSIS — Z125 Encounter for screening for malignant neoplasm of prostate: Secondary | ICD-10-CM

## 2024-05-19 DIAGNOSIS — E782 Mixed hyperlipidemia: Secondary | ICD-10-CM | POA: Diagnosis not present

## 2024-05-19 DIAGNOSIS — I10 Essential (primary) hypertension: Secondary | ICD-10-CM

## 2024-05-19 DIAGNOSIS — Z7984 Long term (current) use of oral hypoglycemic drugs: Secondary | ICD-10-CM | POA: Diagnosis not present

## 2024-05-19 DIAGNOSIS — F17200 Nicotine dependence, unspecified, uncomplicated: Secondary | ICD-10-CM

## 2024-05-19 MED ORDER — FOLIC ACID 1 MG PO TABS: 1.0000 mg | ORAL_TABLET | Freq: Every day | ORAL | 3 refills | Status: AC

## 2024-05-19 NOTE — Assessment & Plan Note (Signed)
 Lab Results  Component Value Date   LDLCALC 38 05/11/2024   Stable, pt to continue current statin crestor  40 mg every day, also has lower HDL due to being less active, pt will work on increased activity

## 2024-05-19 NOTE — Assessment & Plan Note (Signed)
 BP Readings from Last 3 Encounters:  05/19/24 110/68  11/21/23 111/74  11/19/23 113/77   Stable, pt to continue medical treatment losartan  50 mg every day, lopressor  12.5 BID,

## 2024-05-19 NOTE — Progress Notes (Signed)
 Patient ID: Scott Valentine, male   DOB: 01/25/1956, 68 y.o.   MRN: [id number]        Chief Complaint: follow up HTN, folic acid  deficiency, smoker, hld, dm       HPI:  Scott Valentine is a 68 y.o. male here overall doing ok,  Pt denies chest pain, increased sob or doe, wheezing, orthopnea, PND, increased LE swelling, palpitations, dizziness or syncope.   Pt denies polydipsia, polyuria, or new focal neuro s/s.    Pt denies fever, wt loss, night sweats, loss of appetite, or other constitutional symptoms  Has been less active recently due to hernia repair,  Pt still smoking, not ready to quit  Needs folate rx refill.         Wt Readings from Last 3 Encounters:  05/19/24 188 lb 6.4 oz (85.5 kg)  12/12/23 201 lb (91.2 kg)  11/21/23 201 lb (91.2 kg)   BP Readings from Last 3 Encounters:  05/19/24 110/68  11/21/23 111/74  11/19/23 113/77         Past Medical History:  Diagnosis Date   Allergy    Carpal tunnel syndrome    Cervical radiculopathy    numbness in finger tips after cervical surgery    Clotting disorder (HCC)    DVT - secondary vein left leg    Coronary artery disease    Cough 11/08/2009   Diabetes mellitus without complication (HCC)    DVT (deep venous thrombosis) (HCC) 2020   right leg, dissolved itself   Dysmetabolic syndrome X 10/08/2007   GLUCOSE INTOLERANCE 10/19/2008   Heart murmur    HYPERLIPIDEMIA 10/08/2007   HYPERPLASIA PROSTATE UNS W/O UR OBST & OTH LUTS 10/08/2007   Impaired glucose tolerance 12/30/2010   Other and unspecified hyperlipidemia 05/01/2007   Rectal bleeding    STEVENS-JOHNSON SYNDROME, HX OF 10/08/2007   TOBACCO USE, QUIT 10/08/2007   UNS ADVRS EFF UNS RX MEDICINAL&BIOLOGICAL SBSTNC 12/17/2007   Unspecified essential hypertension 05/01/2007   BP increases with being upset    URI 10/29/2007   Past Surgical History:  Procedure Laterality Date   ANAL FISSURE REPAIR  04/25/2011   COLONOSCOPY  2021   DIGIT NAIL REMOVAL Right 08/18/2019    Procedure: REMOVAL OF RIGHT THUMB INGROWN NAIL;  Surgeon: Jerri Kay HERO, MD;  Location: [facility];  Service: Orthopedics;  Laterality: Right;   FINGER SURGERY Left    Third finger fracutre with pins placed. [facility]   HEMORRHOID SURGERY  04/25/2011   POLYPECTOMY     POSTERIOR CERVICAL LAMINECTOMY Right 10/21/2018   Procedure: Right Cervical five-six Cervical six-seven Posterior cervical foraminotomies;  Surgeon: Cheryle Debby DELENA, MD;  Location: [facility] OR;  Service: Neurosurgery;  Laterality: Right;   SPINE SURGERY     UMBILICAL HERNIA REPAIR N/A 11/21/2023   Procedure: OPEN UMBILICAL HERNIA REPAIR;  Surgeon: Curvin Deward MOULD, MD;  Location: [facility] OR;  Service: General;  Laterality: N/A;    reports that he has been smoking cigarettes. He started smoking about 49 years ago. He has a 49.7 pack-year smoking history. He has been exposed to tobacco smoke. He has never used smokeless tobacco. He reports that he does not drink alcohol and does not use drugs. family history includes Cancer in his father; Coronary artery disease (age of onset: 82) in his father; Diabetes in his mother; Heart attack in his paternal grandfather; Heart disease in his brother and father; Lung cancer in his father; Mental illness in his mother; Prostate cancer  in his paternal grandfather. Allergies  Allergen Reactions   Sulfonamide Derivatives Other (See Comments)    stevens johnson   Current Outpatient Medications on File Prior to Visit  Medication Sig Dispense Refill   aspirin  EC 81 MG tablet Take 81 mg by mouth daily.     clonazePAM  (KLONOPIN ) 0.5 MG tablet Take 1 tablet by mouth twice daily as needed for anxiety 60 tablet 2   escitalopram  (LEXAPRO ) 20 MG tablet Take 1 tablet by mouth once daily 90 tablet 3   fexofenadine  (ALLEGRA ) 180 MG tablet Take 1 tablet (180 mg total) by mouth daily as needed. (Patient taking differently: Take 180 mg by mouth daily.) 90 tablet 3   JARDIANCE  25 MG TABS tablet Take 1 tablet by  mouth once daily 90 tablet 0   losartan  (COZAAR ) 100 MG tablet Take 1 tablet by mouth once daily 90 tablet 3   magnesium  oxide (MAG-OX) 400 MG tablet Take 400 mg by mouth daily.     metFORMIN  (GLUCOPHAGE -XR) 500 MG 24 hr tablet TAKE 4 TABLETS BY MOUTH ONCE DAILY WITH BREAKFAST 360 tablet 0   metoprolol  tartrate (LOPRESSOR ) 25 MG tablet Take 1/2 (one-half) tablet by mouth once daily 45 tablet 0   Multiple Vitamin (MULTIVITAMIN) tablet Take 1 tablet by mouth daily.     pioglitazone  (ACTOS ) 15 MG tablet Take 1 tablet by mouth once daily 90 tablet 3   rosuvastatin  (CRESTOR ) 40 MG tablet Take 1 tablet by mouth once daily 90 tablet 3   Semaglutide , 2 MG/DOSE, 8 MG/3ML SOPN Inject 2 mg as directed once a week. 9 mL 3   sildenafil  (VIAGRA ) 100 MG tablet TAKE 1 TABLET BY MOUTH ONCE DAILY AS NEEDED FOR ERECTILE DYSFUNCTION 10 tablet 0   vitamin C (ASCORBIC ACID) 500 MG tablet Take 500 mg by mouth 2 (two) times daily.     oxyCODONE  (ROXICODONE ) 5 MG immediate release tablet Take 1 tablet (5 mg total) by mouth every 6 (six) hours as needed for severe pain (pain score 7-10). (Patient not taking: Reported on 05/19/2024) 15 tablet 0   No current facility-administered medications on file prior to visit.        ROS:  All others reviewed and negative.  Objective        PE:  BP 110/68   Pulse (!) 58   Temp 98.3 F (36.8 C)   Ht 5' 9 (1.753 m)   Wt 188 lb 6.4 oz (85.5 kg)   SpO2 99%   BMI 27.82 kg/m                 Constitutional: Pt appears in NAD               HENT: Head: NCAT.                Right Ear: External ear normal.                 Left Ear: External ear normal.                Eyes: . Pupils are equal, round, and reactive to light. Conjunctivae and EOM are normal               Nose: without d/c or deformity               Neck: Neck supple. Gross normal ROM               Cardiovascular: Normal rate and regular rhythm.  Pulmonary/Chest: Effort normal and breath sounds without  rales or wheezing.                Abd:  Soft, NT, ND, + BS, no organomegaly               Neurological: Pt is alert. At baseline orientation, motor grossly intact               Skin: Skin is warm. No rashes, no other new lesions, LE edema - none               Psychiatric: Pt behavior is normal without agitation   Micro: none  Cardiac tracings I have personally interpreted today:  none  Pertinent Radiological findings (summarize): none   Lab Results  Component Value Date   WBC 8.5 11/10/2023   HGB 16.0 11/10/2023   HCT 46.6 11/10/2023   PLT 202.0 11/10/2023   GLUCOSE 110 (H) 05/11/2024   CHOL 97 05/11/2024   TRIG 126.0 05/11/2024   HDL 33.30 (L) 05/11/2024   LDLDIRECT 88.0 05/02/2021   LDLCALC 38 05/11/2024   ALT 17 05/11/2024   AST 21 05/11/2024   NA 137 05/11/2024   K 3.9 05/11/2024   CL 102 05/11/2024   CREATININE 0.83 05/11/2024   BUN 10 05/11/2024   CO2 23 05/11/2024   TSH 3.04 11/10/2023   PSA 1.24 11/10/2023   HGBA1C 6.7 (H) 05/11/2024   MICROALBUR <0.7 11/10/2023   Assessment/Plan:  Scott Valentine is a 68 y.o. White or Caucasian [1] male with  has a past medical history of Allergy, Carpal tunnel syndrome, Cervical radiculopathy, Clotting disorder (HCC), Coronary artery disease, Cough (11/08/2009), Diabetes mellitus without complication (HCC), DVT (deep venous thrombosis) (HCC) (2020), Dysmetabolic syndrome X (10/08/2007), GLUCOSE INTOLERANCE (10/19/2008), Heart murmur, HYPERLIPIDEMIA (10/08/2007), HYPERPLASIA PROSTATE UNS W/O UR OBST & OTH LUTS (10/08/2007), Impaired glucose tolerance (12/30/2010), Other and unspecified hyperlipidemia (05/01/2007), Rectal bleeding, STEVENS-JOHNSON SYNDROME, HX OF (10/08/2007), TOBACCO USE, QUIT (10/08/2007), UNS ADVRS EFF UNS RX MEDICINAL&BIOLOGICAL SBSTNC (12/17/2007), Unspecified essential hypertension (05/01/2007), and URI (10/29/2007).  Diabetes Lab Results  Component Value Date   HGBA1C 6.7 (H) 05/11/2024   Stable, pt to  continue current medical treatment jardiance  25 every day, metformin  ER 500 mg - 4 every day, actos  15 every day, ozempic  2 mg weekly   Essential hypertension BP Readings from Last 3 Encounters:  05/19/24 110/68  11/21/23 111/74  11/19/23 113/77   Stable, pt to continue medical treatment losartan  50 mg every day, lopressor  12.5 BID,    HYPERLIPIDEMIA Lab Results  Component Value Date   LDLCALC 38 05/11/2024   Stable, pt to continue current statin crestor  40 mg every day, also has lower HDL due to being less active, pt will work on increased activity   Smoker Pt counsled to quit, pt not ready Followup: Return in about 6 months (around 11/16/2024).  Lynwood Rush, MD 05/19/2024 1:02 PM [facility]

## 2024-05-19 NOTE — Patient Instructions (Signed)
 Please quit smoking  Please take all new medication as prescribed - the folic acid   Please continue all other medications as before, and refills have been done if requested.  Please have the pharmacy call with any other refills you may need.  Please continue your efforts at being more active, low cholesterol diet, and weight control.  Please keep your appointments with your specialists as you may have planned  Please make an Appointment to return in 6 months, or sooner if needed, also with Lab Appointment for testing done 3-5 days before at the FIRST FLOOR Lab (so this is for TWO appointments - please see the scheduling desk as you leave)

## 2024-05-19 NOTE — Assessment & Plan Note (Signed)
 Lab Results  Component Value Date   HGBA1C 6.7 (H) 05/11/2024   Stable, pt to continue current medical treatment jardiance  25 every day, metformin  ER 500 mg - 4 every day, actos  15 every day, ozempic  2 mg weekly

## 2024-05-19 NOTE — Assessment & Plan Note (Signed)
 Pt counsled to quit, pt not ready

## 2024-05-20 ENCOUNTER — Other Ambulatory Visit: Payer: Self-pay | Admitting: Internal Medicine

## 2024-05-21 ENCOUNTER — Encounter (HOSPITAL_BASED_OUTPATIENT_CLINIC_OR_DEPARTMENT_OTHER): Payer: Self-pay

## 2024-05-24 ENCOUNTER — Ambulatory Visit (HOSPITAL_BASED_OUTPATIENT_CLINIC_OR_DEPARTMENT_OTHER): Payer: Self-pay | Admitting: Family

## 2024-05-24 ENCOUNTER — Ambulatory Visit (HOSPITAL_BASED_OUTPATIENT_CLINIC_OR_DEPARTMENT_OTHER): Payer: PPO

## 2024-05-24 DIAGNOSIS — I350 Nonrheumatic aortic (valve) stenosis: Secondary | ICD-10-CM | POA: Diagnosis not present

## 2024-05-24 LAB — ECHOCARDIOGRAM COMPLETE
AR max vel: 0.91 cm2
AV Area VTI: 0.94 cm2
AV Area mean vel: 0.84 cm2
AV Mean grad: 28 mmHg
AV Peak grad: 49.8 mmHg
Ao pk vel: 3.53 m/s
Area-P 1/2: 3.27 cm2
S' Lateral: 2.31 cm

## 2024-05-27 ENCOUNTER — Other Ambulatory Visit: Payer: Self-pay | Admitting: Internal Medicine

## 2024-05-28 ENCOUNTER — Encounter: Payer: Self-pay | Admitting: Acute Care

## 2024-06-15 ENCOUNTER — Telehealth: Payer: Self-pay | Admitting: Acute Care

## 2024-06-15 DIAGNOSIS — M79675 Pain in left toe(s): Secondary | ICD-10-CM | POA: Diagnosis not present

## 2024-06-15 DIAGNOSIS — E1142 Type 2 diabetes mellitus with diabetic polyneuropathy: Secondary | ICD-10-CM | POA: Diagnosis not present

## 2024-06-15 DIAGNOSIS — B351 Tinea unguium: Secondary | ICD-10-CM | POA: Diagnosis not present

## 2024-06-15 DIAGNOSIS — L84 Corns and callosities: Secondary | ICD-10-CM | POA: Diagnosis not present

## 2024-06-15 DIAGNOSIS — M79674 Pain in right toe(s): Secondary | ICD-10-CM | POA: Diagnosis not present

## 2024-06-15 NOTE — Telephone Encounter (Signed)
 Returned call to schedule annual LDCT. Left VM to call [phone] for direct line to scheduling appt for LDCT

## 2024-07-05 ENCOUNTER — Other Ambulatory Visit: Payer: Self-pay | Admitting: Internal Medicine

## 2024-07-05 ENCOUNTER — Other Ambulatory Visit: Payer: Self-pay

## 2024-07-07 ENCOUNTER — Ambulatory Visit (HOSPITAL_BASED_OUTPATIENT_CLINIC_OR_DEPARTMENT_OTHER)
Admission: RE | Admit: 2024-07-07 | Discharge: 2024-07-07 | Disposition: A | Discharge status: Home / Self Care | Source: Ambulatory Visit | Attending: Acute Care | Admitting: Acute Care

## 2024-07-07 DIAGNOSIS — Z87891 Personal history of nicotine dependence: Secondary | ICD-10-CM | POA: Insufficient documentation

## 2024-07-07 DIAGNOSIS — Z122 Encounter for screening for malignant neoplasm of respiratory organs: Secondary | ICD-10-CM | POA: Diagnosis not present

## 2024-07-07 DIAGNOSIS — F17210 Nicotine dependence, cigarettes, uncomplicated: Secondary | ICD-10-CM | POA: Diagnosis not present

## 2024-07-12 ENCOUNTER — Other Ambulatory Visit: Payer: Self-pay

## 2024-07-12 DIAGNOSIS — F17210 Nicotine dependence, cigarettes, uncomplicated: Secondary | ICD-10-CM

## 2024-07-12 DIAGNOSIS — Z87891 Personal history of nicotine dependence: Secondary | ICD-10-CM

## 2024-07-12 DIAGNOSIS — Z122 Encounter for screening for malignant neoplasm of respiratory organs: Secondary | ICD-10-CM

## 2024-07-24 ENCOUNTER — Other Ambulatory Visit: Payer: Self-pay | Admitting: Internal Medicine

## 2024-07-26 ENCOUNTER — Other Ambulatory Visit: Payer: Self-pay

## 2024-08-13 ENCOUNTER — Telehealth: Payer: Self-pay

## 2024-08-13 NOTE — Telephone Encounter (Signed)
 Copied from CRM [id number]. Topic: Clinical - Medication Question >> Aug 13, 2024 10:42 AM Laymon HERO wrote: Reason for CRM: Patient calling to check on his medication for Ozempic - has not heard anything about picking it up from the office

## 2024-08-16 NOTE — Telephone Encounter (Signed)
 Called and let Pt know, We are still waiting on his Ozempic  to arrive we will call Pt once the medication has arrived.

## 2024-08-17 ENCOUNTER — Other Ambulatory Visit: Payer: Self-pay | Admitting: Internal Medicine

## 2024-08-17 ENCOUNTER — Other Ambulatory Visit: Payer: Self-pay

## 2024-08-18 ENCOUNTER — Telehealth: Payer: Self-pay

## 2024-08-18 ENCOUNTER — Ambulatory Visit (HOSPITAL_BASED_OUTPATIENT_CLINIC_OR_DEPARTMENT_OTHER): Admitting: Cardiology

## 2024-08-18 ENCOUNTER — Other Ambulatory Visit: Payer: Self-pay | Admitting: Internal Medicine

## 2024-08-18 ENCOUNTER — Encounter (HOSPITAL_BASED_OUTPATIENT_CLINIC_OR_DEPARTMENT_OTHER): Payer: Self-pay | Admitting: Cardiology

## 2024-08-18 ENCOUNTER — Other Ambulatory Visit: Payer: Self-pay

## 2024-08-18 VITALS — BP 122/78 | HR 53 | Ht 69.0 in | Wt 191.2 lb

## 2024-08-18 DIAGNOSIS — I2510 Atherosclerotic heart disease of native coronary artery without angina pectoris: Secondary | ICD-10-CM

## 2024-08-18 DIAGNOSIS — I10 Essential (primary) hypertension: Secondary | ICD-10-CM

## 2024-08-18 DIAGNOSIS — E7800 Pure hypercholesterolemia, unspecified: Secondary | ICD-10-CM | POA: Diagnosis not present

## 2024-08-18 DIAGNOSIS — E1159 Type 2 diabetes mellitus with other circulatory complications: Secondary | ICD-10-CM

## 2024-08-18 DIAGNOSIS — I350 Nonrheumatic aortic (valve) stenosis: Secondary | ICD-10-CM | POA: Diagnosis not present

## 2024-08-18 DIAGNOSIS — F17200 Nicotine dependence, unspecified, uncomplicated: Secondary | ICD-10-CM | POA: Diagnosis not present

## 2024-08-18 DIAGNOSIS — Z716 Tobacco abuse counseling: Secondary | ICD-10-CM

## 2024-08-18 NOTE — Telephone Encounter (Signed)
 Pt calling for status check on Ozempic . He is also concerned about his April appt and doesn't know who he is going to see at this appt. Please return pt phone call, thank you

## 2024-08-18 NOTE — Patient Instructions (Addendum)
 Let's try stopping the metoprolol . If you have any issues, let me know. If we add it back we will use the long acting version of this. __________________________________________________  Medication Instructions:  Your physician recommends that you continue on your current medications as directed. Please refer to the Current Medication list given to you today.  *If you need a refill on your cardiac medications before your next appointment, please call your pharmacy*  Lab Work: none  Testing/Procedures: ECHO DUE IN 6 MONTHS Your physician has requested that you have an echocardiogram. Echocardiography is a painless test that uses sound waves to create images of your heart. It provides your doctor with information about the size and shape of your heart and how well your hearts chambers and valves are working. This procedure takes approximately one hour. There are no restrictions for this procedure. Please do NOT wear cologne, perfume, aftershave, or lotions (deodorant is allowed). Please arrive 15 minutes prior to your appointment time.  Please note: We ask at that you not bring children with you during ultrasound (echo/ vascular) testing. Due to room size and safety concerns, children are not allowed in the ultrasound rooms during exams. Our front office staff cannot provide observation of children in our lobby area while testing is being conducted. An adult accompanying a patient to their appointment will only be allowed in the ultrasound room at the discretion of the ultrasound technician under special circumstances. We apologize for any inconvenience.   Follow-Up: At [facility], you and your health needs are our priority.  As part of our continuing mission to provide you with exceptional heart care, our providers are all part of one team.  This team includes your primary Cardiologist (physician) and Advanced Practice Providers or APPs (Physician Assistants and Nurse Practitioners)  who all work together to provide you with the care you need, when you need it.  Your next appointment:   6 month(s)  Provider:   Shelda Bruckner, MD, Rosaline Bane, NP, or Reche Finder, NP

## 2024-08-18 NOTE — Telephone Encounter (Signed)
 Copied from CRM #[id number]. Topic: Clinical - Medication Question >> Aug 18, 2024  1:39 PM Viola F wrote: Reason for CRM: Patient wants to know if the Ozympic has arrived to the office yet? Please call him at [phone] (M)

## 2024-08-18 NOTE — Progress Notes (Signed)
 Cardiology Office Note:  .   Date:  08/18/2024  ID:  Scott Valentine, DOB [birth year], MRN [id number] PCP: Norleen Lynwood ORN, MD  [facility] Providers Cardiologist:  Shelda Bruckner, MD {  History of Present Illness: .   Scott Valentine is a 68 y.o. male with PMH CAD, hypertension, type II diabetes, hyperlipidemia, current tobacco use. He was initially seen by Rosaline Bane in prevention clinic and established care with me on 08/18/24.  Pertinent CV history: Ca score 04/10/23 was 2614 (98th %ile). Given high score, underwent myoview  05/28/2023, no ischemia. Echo 05/21/23 with EF 50-55%, mild LVH, G2DD, moderately calcified aortic valve with moderate aortic stenosis. Repeat echo 05/24/24 with EF 55-60%, indeterminate diastolic function, severely calcified aortic valve, moderate to severe AS (DI 0.27, AVA 0.94, mean gradient 28 but concern for borderline low flow low gradient severe AS.   Today: Reviewed results of his echo. Discussed aortic stenosis. Has been retired for 4 years, has noticed gradual decrease in his stamina but no chest pain, shortness of breath, or other limiting symptoms. Lays brick, does heavy physical activity intermittently.   ROS positive for fingertip numbness since his spinal surgery, told this was permanent.   ROS: Denies chest pain, shortness of breath at rest or with normal exertion. No PND, orthopnea, LE edema or unexpected weight gain. No syncope or palpitations. ROS otherwise negative except as noted.   Studies Reviewed: SABRA    EKG:  EKG Interpretation Date/Time:  Wednesday August 18 2024 09:07:13 EST Ventricular Rate:  58 PR Interval:  158 QRS Duration:  112 QT Interval:  424 QTC Calculation: 416 R Axis:   14  Text Interpretation: Sinus bradycardia When compared with ECG of 07-May-2023 08:31, No significant change was found Confirmed by Bruckner Shelda [phone]) on 08/18/2024 9:16:09 AM    Physical Exam:   VS:  BP 122/78   Pulse (!) 53    Ht 5' 9 (1.753 m)   Wt 191 lb 3.2 oz (86.7 kg)   SpO2 99%   BMI 28.24 kg/m    Wt Readings from Last 3 Encounters:  08/18/24 191 lb 3.2 oz (86.7 kg)  05/19/24 188 lb 6.4 oz (85.5 kg)  12/12/23 201 lb (91.2 kg)    GEN: Well nourished, well developed in no acute distress HEENT: Normal, moist mucous membranes NECK: No JVD CARDIAC: regular rhythm, normal S1 and S2, no rubs or gallops. 2-3/6 murmur with quiet S2. VASCULAR: Radial and DP pulses 2+ bilaterally. No carotid bruits RESPIRATORY:  Clear to auscultation with end expiratory wheezing ABDOMEN: Soft, non-tender, non-distended MUSCULOSKELETAL:  Ambulates independently SKIN: Warm and dry, no edema NEUROLOGIC:  Alert and oriented x 3. No focal neuro deficits noted. PSYCHIATRIC:  Normal affect    ASSESSMENT AND PLAN: .    Aortic stenosis, concern for severe -no chest pain, heart failure symptoms, or syncope -reviewed his echo with him today -would get repeat echo in 6 mos prior to next visit -reviewed red flags that need immediate medical attention  Severe coronary calcification consistent with CAD Hypercholesterolemia -negative myoview  -on aspirin , rosuvastatin  -LDL goal <55, last 44  Hypertension -continue losartan  -he is on metoprolol  tartrate 1/2 tab once a day. Has been on this for years. Discussed that tartrate is short acting, should be twice a day if used or should be long acting metoprolol . He reports he has been on this since a remote stress test around 2008 with Dr. Tish. Will trial stopping this.  Type II diabetes -on sglt2i  and GLP1. Has lost 30-40 lbs on ozempic  -on metformin  -on pioglitazone , if he develops any cardiac symptoms (esp with risk of fluid retention) would stop this given black box warning  Tobacco abuse The patient was counseled on tobacco cessation today for 5 minutes.  Counseling included reviewing the risks of smoking tobacco products, how it impacts the patient's current medical diagnoses  and different strategies for quitting.  Pharmacotherapy to aid in tobacco cessation was not prescribed today. He is precontemplative, did discuss how this affects inflammation.  CV risk counseling and prevention -recommend heart healthy/Mediterranean diet, with whole grains, fruits, vegetable, fish, lean meats, nuts, and olive oil. Limit salt. -recommend moderate walking, 3-5 times/week for 30-50 minutes each session. Aim for at least 150 minutes/week. Goal should be pace of 3 miles/hours, or walking 1.5 miles in 30 minutes -recommend avoidance of tobacco products. Avoid excess alcohol.  Dispo: 6 mos with echo prior  Signed, Shelda Bruckner, MD   Shelda Bruckner, MD, PhD, [facility]  [facility]    Heart & Vascular at [facility] at [facility] [address], [address] [place], [place] [address] [phone]

## 2024-08-25 ENCOUNTER — Telehealth: Payer: Self-pay | Admitting: Internal Medicine

## 2024-08-25 NOTE — Telephone Encounter (Signed)
 Pt asks to have provider reach out about medication refill, pt states they are about a week from running out and are worried about the cost of having to return and re-start the process.

## 2024-08-25 NOTE — Telephone Encounter (Signed)
 Patient gets Ozempic  Patient Assistance, usually 4 moths at at time. Next Friday, 12/26 is his last dose he has on hand, and usually get gets 4 months supply and will reapply in 2026.   Medication has not arrived onsite at this time. Patient wife worried about running out, will research status and let patient know.

## 2024-08-26 MED ORDER — SEMAGLUTIDE (2 MG/DOSE) 8 MG/3ML ~~LOC~~ SOPN: 2.0000 mg | PEN_INJECTOR | SUBCUTANEOUS | Status: DC

## 2024-08-26 NOTE — Progress Notes (Signed)
 Contacted patient regarding phone call from 12/17 stating he is needing Ozempic  refill from PAP. Pt is currently on Ozempic  2 mg weekly and has been getting it thorugh PAP. Reviewed expected cost through insurance for next year ($200 per 28 DS) which pt reports is unaffordable. Will plan to switch to Trulicity  due to PAP being available. Equipotent dosing from Ozempic  2 mg to Trulicity  4.5 mg. He will run out of Ozempic  after next week. Left patient 2 boxes of Ozempic  2 mg samples.  Will fill out Trulicity  PAP and leave at front desk for patient signature. He plans to come by 12/23 to sign.  Darrelyn Drum, PharmD, BCPS, CPP Clinical Pharmacist Practitioner [facility] [facility] at [facility] [phone]

## 2024-08-27 NOTE — Telephone Encounter (Signed)
 This has been handled.

## 2024-08-29 ENCOUNTER — Other Ambulatory Visit: Payer: Self-pay | Admitting: Internal Medicine

## 2024-08-31 ENCOUNTER — Telehealth: Payer: Self-pay | Admitting: Pharmacist

## 2024-08-31 MED ORDER — TRULICITY 4.5 MG/0.5ML ~~LOC~~ SOAJ: 4.5000 mg | SUBCUTANEOUS | Status: AC

## 2024-08-31 NOTE — Telephone Encounter (Signed)
 Changing patient from Ozempic  to Trulicity  due to discontinuation of Ozempic  PAP and pt is unable to afford copay/deductible. Received patient signature and income information on Trulicity  PAP. Received provider signature. Faxing to [employer] and to Med Assistance team.  Darrelyn Drum, PharmD, BCPS, CPP Clinical Pharmacist Practitioner [place] [facility] at [facility] [phone]

## 2024-11-17 ENCOUNTER — Ambulatory Visit: Admitting: Internal Medicine

## 2024-12-13 ENCOUNTER — Other Ambulatory Visit

## 2024-12-15 ENCOUNTER — Ambulatory Visit

## 2024-12-17 ENCOUNTER — Ambulatory Visit

## 2025-02-16 ENCOUNTER — Other Ambulatory Visit (HOSPITAL_BASED_OUTPATIENT_CLINIC_OR_DEPARTMENT_OTHER)

## 2025-05-23 ENCOUNTER — Encounter: Admitting: Internal Medicine
